# Patient Record
Sex: Female | Born: 1985 | Hispanic: Yes | Marital: Married | Smoking: Never smoker
Health system: Southern US, Community
[De-identification: ages and names within clinical notes are randomized; demographics above are authoritative.]

## PROBLEM LIST (undated history)

## (undated) DIAGNOSIS — O021 Missed abortion: Secondary | ICD-10-CM

## (undated) DIAGNOSIS — N83209 Unspecified ovarian cyst, unspecified side: Secondary | ICD-10-CM

## (undated) DIAGNOSIS — O3440 Maternal care for other abnormalities of cervix, unspecified trimester: Secondary | ICD-10-CM

## (undated) DIAGNOSIS — O219 Vomiting of pregnancy, unspecified: Secondary | ICD-10-CM

## (undated) DIAGNOSIS — R319 Hematuria, unspecified: Secondary | ICD-10-CM

## (undated) DIAGNOSIS — Z98891 History of uterine scar from previous surgery: Secondary | ICD-10-CM

## (undated) DIAGNOSIS — N979 Female infertility, unspecified: Secondary | ICD-10-CM

## (undated) DIAGNOSIS — N879 Dysplasia of cervix uteri, unspecified: Secondary | ICD-10-CM

## (undated) DIAGNOSIS — O4692 Antepartum hemorrhage, unspecified, second trimester: Secondary | ICD-10-CM

## (undated) DIAGNOSIS — Z9889 Other specified postprocedural states: Secondary | ICD-10-CM

## (undated) HISTORY — DX: Female infertility, unspecified: N97.9

## (undated) HISTORY — DX: Dysplasia of cervix uteri, unspecified: N87.9

## (undated) HISTORY — DX: Maternal care for other abnormalities of cervix, unspecified trimester: O34.40

## (undated) HISTORY — DX: Unspecified ovarian cyst, unspecified side: N83.209

## (undated) HISTORY — DX: Hematuria, unspecified: R31.9

## (undated) HISTORY — DX: Missed abortion: O02.1

## (undated) HISTORY — DX: Maternal care for other abnormalities of cervix, unspecified trimester: Z98.890

## (undated) HISTORY — DX: Vomiting of pregnancy, unspecified: O21.9

## (undated) HISTORY — DX: Antepartum hemorrhage, unspecified, second trimester: O46.92

---

## 2004-07-03 ENCOUNTER — Emergency Department: Payer: Self-pay | Admitting: General Practice

## 2005-05-19 ENCOUNTER — Emergency Department: Payer: Self-pay | Admitting: Emergency Medicine

## 2005-05-20 ENCOUNTER — Ambulatory Visit: Payer: Self-pay | Admitting: Emergency Medicine

## 2005-06-08 ENCOUNTER — Emergency Department: Payer: Self-pay | Admitting: General Practice

## 2006-01-26 ENCOUNTER — Emergency Department: Payer: Self-pay | Admitting: Emergency Medicine

## 2006-01-27 ENCOUNTER — Ambulatory Visit: Payer: Self-pay | Admitting: Emergency Medicine

## 2006-06-10 ENCOUNTER — Observation Stay: Payer: Self-pay

## 2006-06-12 ENCOUNTER — Observation Stay: Payer: Self-pay | Admitting: Certified Nurse Midwife

## 2006-06-16 ENCOUNTER — Observation Stay: Payer: Self-pay

## 2006-06-25 ENCOUNTER — Observation Stay: Payer: Self-pay

## 2006-06-28 ENCOUNTER — Observation Stay: Payer: Self-pay | Admitting: Obstetrics and Gynecology

## 2006-07-01 ENCOUNTER — Observation Stay: Payer: Self-pay | Admitting: Obstetrics and Gynecology

## 2006-07-04 ENCOUNTER — Observation Stay: Payer: Self-pay

## 2006-07-07 ENCOUNTER — Observation Stay: Payer: Self-pay | Admitting: Obstetrics and Gynecology

## 2006-07-11 ENCOUNTER — Observation Stay: Payer: Self-pay | Admitting: Obstetrics and Gynecology

## 2006-07-14 ENCOUNTER — Observation Stay: Payer: Self-pay | Admitting: Certified Nurse Midwife

## 2006-07-17 ENCOUNTER — Observation Stay: Payer: Self-pay | Admitting: Obstetrics and Gynecology

## 2006-07-20 ENCOUNTER — Inpatient Hospital Stay: Payer: Self-pay | Admitting: Obstetrics and Gynecology

## 2007-09-11 ENCOUNTER — Emergency Department: Payer: Self-pay | Admitting: Unknown Physician Specialty

## 2010-02-10 ENCOUNTER — Emergency Department: Payer: Self-pay | Admitting: Emergency Medicine

## 2010-06-30 HISTORY — PX: LEEP: SHX91

## 2014-08-22 LAB — OB RESULTS CONSOLE HEPATITIS B SURFACE ANTIGEN: Hepatitis B Surface Ag: NEGATIVE

## 2014-08-22 LAB — OB RESULTS CONSOLE GC/CHLAMYDIA
CHLAMYDIA, DNA PROBE: NEGATIVE
Gonorrhea: NEGATIVE

## 2014-08-22 LAB — OB RESULTS CONSOLE PLATELET COUNT: Platelets: 249 10*3/uL

## 2014-08-22 LAB — OB RESULTS CONSOLE HGB/HCT, BLOOD
HCT: 40 %
Hemoglobin: 13.6 g/dL

## 2014-08-22 LAB — OB RESULTS CONSOLE RUBELLA ANTIBODY, IGM: Rubella: IMMUNE

## 2014-08-22 LAB — OB RESULTS CONSOLE ABO/RH: RH Type: POSITIVE

## 2014-08-22 LAB — OB RESULTS CONSOLE HIV ANTIBODY (ROUTINE TESTING): HIV: NONREACTIVE

## 2014-08-22 LAB — OB RESULTS CONSOLE VARICELLA ZOSTER ANTIBODY, IGG: VARICELLA IGG: IMMUNE

## 2014-08-22 LAB — OB RESULTS CONSOLE RPR: RPR: NONREACTIVE

## 2014-08-22 LAB — OB RESULTS CONSOLE ANTIBODY SCREEN: Antibody Screen: NEGATIVE

## 2014-09-12 LAB — HM PAP SMEAR: HM PAP: NEGATIVE

## 2014-10-28 ENCOUNTER — Emergency Department
Admit: 2014-10-28 | Disposition: A | Discharge status: Home / Self Care | Payer: Self-pay | Admitting: Emergency Medicine

## 2014-10-28 LAB — URINALYSIS, COMPLETE
Bilirubin,UR: NEGATIVE
GLUCOSE, UR: NEGATIVE mg/dL (ref 0–75)
Ketone: NEGATIVE
NITRITE: NEGATIVE
PH: 8 (ref 4.5–8.0)
Protein: 30
SPECIFIC GRAVITY: 1.012 (ref 1.003–1.030)

## 2014-10-28 LAB — COMPREHENSIVE METABOLIC PANEL
ALBUMIN: 3.3 g/dL — AB
ALK PHOS: 76 U/L
ALT: 58 U/L — AB
Anion Gap: 8 (ref 7–16)
BILIRUBIN TOTAL: 0.4 mg/dL
BUN: 6 mg/dL
CREATININE: 0.44 mg/dL
Calcium, Total: 9.2 mg/dL
Chloride: 107 mmol/L
Co2: 23 mmol/L
EGFR (African American): >60
EGFR (Non-African Amer.): >60
Glucose: 95 mg/dL
POTASSIUM: 3.8 mmol/L
SGOT(AST): 40 U/L
SODIUM: 138 mmol/L
TOTAL PROTEIN: 6.1 g/dL — AB

## 2014-10-28 LAB — CBC
HCT: 39.5 % (ref 35.0–47.0)
HGB: 13.3 g/dL (ref 12.0–16.0)
MCH: 34 pg (ref 26.0–34.0)
MCHC: 33.6 g/dL (ref 32.0–36.0)
MCV: 101 fL — ABNORMAL HIGH (ref 80–100)
PLATELETS: 222 10*3/uL (ref 150–440)
RBC: 3.9 10*6/uL (ref 3.80–5.20)
RDW: 12.9 % (ref 11.5–14.5)
WBC: 8.3 10*3/uL (ref 3.6–11.0)

## 2014-10-28 LAB — HCG, QUANTITATIVE, PREGNANCY: Beta Hcg, Quant.: 37831 m[IU]/mL — ABNORMAL HIGH

## 2014-11-24 LAB — US OB FOLLOW UP

## 2014-12-04 ENCOUNTER — Telehealth: Payer: Self-pay | Admitting: Obstetrics and Gynecology

## 2014-12-04 DIAGNOSIS — K831 Obstruction of bile duct: Secondary | ICD-10-CM

## 2014-12-04 NOTE — Telephone Encounter (Signed)
Pt. States the itching all over which started yesterday. No rash. No difficulty breathing. No jaundice. She states she is going out of town this weekend also and hopes to get something for this. Pt. Informed that she could take Benadryl 25 mg. Po as directed, Aveeno bath. I will let Dr. Algis Downs. Know and see what else we need to do.  She had Cholestasis her last 2 pregnancies. Last pregnancy it started around this time.

## 2014-12-05 ENCOUNTER — Other Ambulatory Visit: Payer: No Typology Code available for payment source

## 2014-12-05 DIAGNOSIS — K831 Obstruction of bile duct: Secondary | ICD-10-CM

## 2014-12-05 NOTE — Telephone Encounter (Signed)
Pt. Coming in this pm for labs: hepatic panel and bile salts.

## 2014-12-06 NOTE — Addendum Note (Signed)
Addended by: Richardson ChiquitoRAVIS, Kamen Hanken M on: 12/06/2014 08:49 AM   Modules accepted: Orders

## 2014-12-07 LAB — HEPATIC FUNCTION PANEL
ALBUMIN: 3.8 g/dL (ref 3.5–5.5)
ALK PHOS: 109 IU/L (ref 39–117)
ALT: 50 IU/L — ABNORMAL HIGH (ref 0–32)
AST: 25 IU/L (ref 0–40)
Bilirubin, Direct: 0.08 mg/dL (ref 0.00–0.40)
Total Protein: 6.1 g/dL (ref 6.0–8.5)

## 2014-12-07 LAB — BILE ACIDS, TOTAL: Bile Acids Total: 11.6 umol/L (ref 4.7–24.5)

## 2014-12-08 ENCOUNTER — Telehealth: Payer: Self-pay

## 2014-12-08 NOTE — Telephone Encounter (Signed)
-----   Message from Martin A Defrancesco, MD sent at 12/08/2014  4:15 PM EDT ----- Please notify - Abnormal Labs SGOT elevated; all other Hepatic enzymes normal. Repeat Hepatic profile ion 3 months Bile acids are normal.  Recommend skin moisturizers and OTC hydrocortisone cream prn 

## 2014-12-11 NOTE — Telephone Encounter (Signed)
Pt aware.

## 2014-12-11 NOTE — Telephone Encounter (Signed)
-----   Message from Herold Harms, MD sent at 12/08/2014  4:15 PM EDT ----- Please notify - Abnormal Labs SGOT elevated; all other Hepatic enzymes normal. Repeat Hepatic profile ion 3 months Bile acids are normal.  Recommend skin moisturizers and OTC hydrocortisone cream prn

## 2014-12-14 ENCOUNTER — Encounter: Payer: Self-pay | Admitting: Obstetrics and Gynecology

## 2014-12-14 ENCOUNTER — Ambulatory Visit (INDEPENDENT_AMBULATORY_CARE_PROVIDER_SITE_OTHER): Payer: No Typology Code available for payment source | Admitting: Obstetrics and Gynecology

## 2014-12-14 VITALS — BP 109/71 | HR 109 | Wt 184.7 lb

## 2014-12-14 DIAGNOSIS — Z3492 Encounter for supervision of normal pregnancy, unspecified, second trimester: Secondary | ICD-10-CM

## 2014-12-14 DIAGNOSIS — O219 Vomiting of pregnancy, unspecified: Secondary | ICD-10-CM

## 2014-12-14 DIAGNOSIS — Z9889 Other specified postprocedural states: Secondary | ICD-10-CM

## 2014-12-14 DIAGNOSIS — N8320 Unspecified ovarian cysts: Secondary | ICD-10-CM

## 2014-12-14 DIAGNOSIS — N879 Dysplasia of cervix uteri, unspecified: Secondary | ICD-10-CM

## 2014-12-14 DIAGNOSIS — Z3482 Encounter for supervision of other normal pregnancy, second trimester: Secondary | ICD-10-CM

## 2014-12-14 DIAGNOSIS — Z87448 Personal history of other diseases of urinary system: Secondary | ICD-10-CM

## 2014-12-14 DIAGNOSIS — O3481 Maternal care for other abnormalities of pelvic organs, first trimester: Secondary | ICD-10-CM

## 2014-12-14 DIAGNOSIS — N83209 Unspecified ovarian cyst, unspecified side: Secondary | ICD-10-CM

## 2014-12-14 DIAGNOSIS — O0992 Supervision of high risk pregnancy, unspecified, second trimester: Secondary | ICD-10-CM

## 2014-12-14 LAB — POCT URINALYSIS DIPSTICK
Bilirubin, UA: NEGATIVE
Glucose, UA: NEGATIVE
Ketones, UA: NEGATIVE
LEUKOCYTES UA: NEGATIVE
Nitrite, UA: NEGATIVE
PROTEIN UA: NEGATIVE
Spec Grav, UA: 1.02
UROBILINOGEN UA: 0.2
pH, UA: 6.5

## 2014-12-14 NOTE — Progress Notes (Signed)
NO COMPLAINTS 

## 2014-12-15 DIAGNOSIS — O219 Vomiting of pregnancy, unspecified: Secondary | ICD-10-CM | POA: Insufficient documentation

## 2014-12-15 DIAGNOSIS — Z87448 Personal history of other diseases of urinary system: Secondary | ICD-10-CM | POA: Insufficient documentation

## 2014-12-15 DIAGNOSIS — N879 Dysplasia of cervix uteri, unspecified: Secondary | ICD-10-CM | POA: Insufficient documentation

## 2014-12-15 DIAGNOSIS — Z9889 Other specified postprocedural states: Secondary | ICD-10-CM | POA: Insufficient documentation

## 2014-12-15 DIAGNOSIS — N83209 Unspecified ovarian cyst, unspecified side: Secondary | ICD-10-CM | POA: Insufficient documentation

## 2014-12-15 DIAGNOSIS — O3481 Maternal care for other abnormalities of pelvic organs, first trimester: Secondary | ICD-10-CM

## 2014-12-15 DIAGNOSIS — O4692 Antepartum hemorrhage, unspecified, second trimester: Secondary | ICD-10-CM | POA: Insufficient documentation

## 2014-12-15 NOTE — Progress Notes (Signed)
Anatomy scan normal; placenta anterior, and remote from cervix;Cervix long and closed without funneling (history of LEEP procedure)

## 2014-12-21 ENCOUNTER — Telehealth: Payer: Self-pay | Admitting: Obstetrics and Gynecology

## 2014-12-21 NOTE — Telephone Encounter (Signed)
She has a rash under lip wants to know what's best to put on it.

## 2014-12-22 NOTE — Telephone Encounter (Signed)
Pt states she has a rash on her lip. Burns, red, dry and itchy. Using anti itch cream which helps. Rash will come and go. Advised to use A &d ointment or Aquaphor. If no better in a week she will need to be seen.

## 2014-12-30 ENCOUNTER — Emergency Department
Admission: EM | Admit: 2014-12-30 | Discharge: 2014-12-30 | Disposition: A | Discharge status: Other Acute Inpatient Hospital | Payer: Commercial Managed Care - PPO | Attending: Emergency Medicine | Admitting: Emergency Medicine

## 2014-12-30 ENCOUNTER — Inpatient Hospital Stay (HOSPITAL_COMMUNITY)
Admission: EM | Admit: 2014-12-30 | Discharge: 2015-01-10 | DRG: 765 | Disposition: A | Discharge status: Home / Self Care | Payer: PRIVATE HEALTH INSURANCE | Attending: Obstetrics & Gynecology | Admitting: Obstetrics & Gynecology

## 2014-12-30 ENCOUNTER — Encounter: Payer: Self-pay | Admitting: *Deleted

## 2014-12-30 DIAGNOSIS — O209 Hemorrhage in early pregnancy, unspecified: Secondary | ICD-10-CM | POA: Insufficient documentation

## 2014-12-30 DIAGNOSIS — S3762XA Contusion of uterus, initial encounter: Secondary | ICD-10-CM | POA: Diagnosis present

## 2014-12-30 DIAGNOSIS — O9A22 Injury, poisoning and certain other consequences of external causes complicating childbirth: Secondary | ICD-10-CM | POA: Insufficient documentation

## 2014-12-30 DIAGNOSIS — O42912 Preterm premature rupture of membranes, unspecified as to length of time between rupture and onset of labor, second trimester: Secondary | ICD-10-CM | POA: Diagnosis not present

## 2014-12-30 DIAGNOSIS — R000 Tachycardia, unspecified: Secondary | ICD-10-CM

## 2014-12-30 DIAGNOSIS — Y92410 Unspecified street and highway as the place of occurrence of the external cause: Secondary | ICD-10-CM | POA: Diagnosis not present

## 2014-12-30 DIAGNOSIS — Z3A26 26 weeks gestation of pregnancy: Secondary | ICD-10-CM | POA: Diagnosis not present

## 2014-12-30 DIAGNOSIS — S40029A Contusion of unspecified upper arm, initial encounter: Secondary | ICD-10-CM | POA: Diagnosis present

## 2014-12-30 DIAGNOSIS — Y998 Other external cause status: Secondary | ICD-10-CM | POA: Diagnosis not present

## 2014-12-30 DIAGNOSIS — S299XXA Unspecified injury of thorax, initial encounter: Secondary | ICD-10-CM | POA: Insufficient documentation

## 2014-12-30 DIAGNOSIS — S3991XA Unspecified injury of abdomen, initial encounter: Secondary | ICD-10-CM

## 2014-12-30 DIAGNOSIS — O42919 Preterm premature rupture of membranes, unspecified as to length of time between rupture and onset of labor, unspecified trimester: Secondary | ICD-10-CM | POA: Diagnosis present

## 2014-12-30 DIAGNOSIS — O4690 Antepartum hemorrhage, unspecified, unspecified trimester: Secondary | ICD-10-CM

## 2014-12-30 DIAGNOSIS — S301XXA Contusion of abdominal wall, initial encounter: Secondary | ICD-10-CM | POA: Diagnosis not present

## 2014-12-30 DIAGNOSIS — Z833 Family history of diabetes mellitus: Secondary | ICD-10-CM

## 2014-12-30 DIAGNOSIS — O4692 Antepartum hemorrhage, unspecified, second trimester: Secondary | ICD-10-CM | POA: Diagnosis present

## 2014-12-30 DIAGNOSIS — Z87410 Personal history of cervical dysplasia: Secondary | ICD-10-CM

## 2014-12-30 DIAGNOSIS — Z98891 History of uterine scar from previous surgery: Secondary | ICD-10-CM

## 2014-12-30 DIAGNOSIS — Y9389 Activity, other specified: Secondary | ICD-10-CM | POA: Insufficient documentation

## 2014-12-30 DIAGNOSIS — T1490XA Injury, unspecified, initial encounter: Secondary | ICD-10-CM

## 2014-12-30 DIAGNOSIS — S8010XA Contusion of unspecified lower leg, initial encounter: Secondary | ICD-10-CM | POA: Diagnosis present

## 2014-12-30 DIAGNOSIS — O4693 Antepartum hemorrhage, unspecified, third trimester: Secondary | ICD-10-CM

## 2014-12-30 DIAGNOSIS — O322XX0 Maternal care for transverse and oblique lie, not applicable or unspecified: Secondary | ICD-10-CM | POA: Diagnosis not present

## 2014-12-30 DIAGNOSIS — O9989 Other specified diseases and conditions complicating pregnancy, childbirth and the puerperium: Secondary | ICD-10-CM | POA: Diagnosis not present

## 2014-12-30 HISTORY — DX: History of uterine scar from previous surgery: Z98.891

## 2014-12-30 LAB — CBC WITH DIFFERENTIAL/PLATELET
Basophils Absolute: 0 10*3/uL (ref 0–0.1)
Basophils Relative: 0 %
EOS PCT: 2 %
Eosinophils Absolute: 0.2 10*3/uL (ref 0–0.7)
HEMATOCRIT: 38.1 % (ref 35.0–47.0)
HEMOGLOBIN: 12.8 g/dL (ref 12.0–16.0)
LYMPHS ABS: 1 10*3/uL (ref 1.0–3.6)
Lymphocytes Relative: 7 %
MCH: 34.6 pg — ABNORMAL HIGH (ref 26.0–34.0)
MCHC: 33.6 g/dL (ref 32.0–36.0)
MCV: 103.1 fL — ABNORMAL HIGH (ref 80.0–100.0)
Monocytes Absolute: 0.8 10*3/uL (ref 0.2–0.9)
Monocytes Relative: 5 %
NEUTROS PCT: 86 %
Neutro Abs: 12.6 10*3/uL — ABNORMAL HIGH (ref 1.4–6.5)
Platelets: 242 10*3/uL (ref 150–440)
RBC: 3.69 MIL/uL — AB (ref 3.80–5.20)
RDW: 13.3 % (ref 11.5–14.5)
WBC: 14.5 10*3/uL — ABNORMAL HIGH (ref 3.6–11.0)

## 2014-12-30 LAB — BASIC METABOLIC PANEL
ANION GAP: 11 (ref 5–15)
BUN: 10 mg/dL (ref 6–20)
CO2: 25 mmol/L (ref 22–32)
CREATININE: 0.53 mg/dL (ref 0.44–1.00)
Calcium: 9.3 mg/dL (ref 8.9–10.3)
Chloride: 105 mmol/L (ref 101–111)
GFR calc Af Amer: >60 mL/min (ref >60)
GFR calc non Af Amer: >60 mL/min (ref >60)
Glucose, Bld: 127 mg/dL — ABNORMAL HIGH (ref 65–99)
Potassium: 3.5 mmol/L (ref 3.5–5.1)
Sodium: 141 mmol/L (ref 135–145)

## 2014-12-30 LAB — ABO/RH: ABO/RH(D): O POS

## 2014-12-30 MED ORDER — SODIUM CHLORIDE 0.9 % IV BOLUS (SEPSIS)
1000.0000 mL | Freq: Once | INTRAVENOUS | Status: AC
  Administered 2014-12-30: 1000 mL via INTRAVENOUS

## 2014-12-30 MED ORDER — ACETAMINOPHEN 325 MG PO TABS
650.0000 mg | ORAL_TABLET | Freq: Once | ORAL | Status: AC
  Administered 2014-12-31: 650 mg via ORAL
  Filled 2014-12-30: qty 2
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
## 2014-12-30 NOTE — ED Notes (Signed)
This writer was called into patient's room because patient was c/o vaginal bleeding. MD aware.

## 2014-12-30 NOTE — ED Notes (Signed)
Fetal heart tones 148 obtained by doppler by Dr. Shaune PollackLord.

## 2014-12-30 NOTE — ED Notes (Signed)
Report called to Nat Mathavid RN charge RN at [facility]. Patient left at approximately 2250 via [facility]
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
## 2014-12-31 ENCOUNTER — Encounter (HOSPITAL_COMMUNITY): Payer: Self-pay | Admitting: Anesthesiology

## 2014-12-31 ENCOUNTER — Emergency Department (HOSPITAL_COMMUNITY): Payer: PRIVATE HEALTH INSURANCE

## 2014-12-31 ENCOUNTER — Encounter (HOSPITAL_COMMUNITY): Payer: Self-pay | Admitting: Radiology

## 2014-12-31 ENCOUNTER — Inpatient Hospital Stay (HOSPITAL_COMMUNITY): Payer: PRIVATE HEALTH INSURANCE

## 2014-12-31 DIAGNOSIS — S8010XA Contusion of unspecified lower leg, initial encounter: Secondary | ICD-10-CM | POA: Diagnosis present

## 2014-12-31 DIAGNOSIS — S3991XA Unspecified injury of abdomen, initial encounter: Secondary | ICD-10-CM | POA: Diagnosis not present

## 2014-12-31 DIAGNOSIS — O322XX0 Maternal care for transverse and oblique lie, not applicable or unspecified: Secondary | ICD-10-CM | POA: Diagnosis not present

## 2014-12-31 DIAGNOSIS — O4692 Antepartum hemorrhage, unspecified, second trimester: Secondary | ICD-10-CM | POA: Diagnosis not present

## 2014-12-31 DIAGNOSIS — S40029A Contusion of unspecified upper arm, initial encounter: Secondary | ICD-10-CM | POA: Diagnosis present

## 2014-12-31 DIAGNOSIS — O9A212 Injury, poisoning and certain other consequences of external causes complicating pregnancy, second trimester: Secondary | ICD-10-CM | POA: Diagnosis not present

## 2014-12-31 DIAGNOSIS — Z3A26 26 weeks gestation of pregnancy: Secondary | ICD-10-CM | POA: Diagnosis present

## 2014-12-31 DIAGNOSIS — Z87410 Personal history of cervical dysplasia: Secondary | ICD-10-CM | POA: Diagnosis not present

## 2014-12-31 DIAGNOSIS — O26892 Other specified pregnancy related conditions, second trimester: Secondary | ICD-10-CM | POA: Diagnosis not present

## 2014-12-31 DIAGNOSIS — Z3A25 25 weeks gestation of pregnancy: Secondary | ICD-10-CM

## 2014-12-31 DIAGNOSIS — O9989 Other specified diseases and conditions complicating pregnancy, childbirth and the puerperium: Secondary | ICD-10-CM | POA: Diagnosis present

## 2014-12-31 DIAGNOSIS — Z833 Family history of diabetes mellitus: Secondary | ICD-10-CM | POA: Diagnosis not present

## 2014-12-31 DIAGNOSIS — O468X2 Other antepartum hemorrhage, second trimester: Secondary | ICD-10-CM | POA: Diagnosis not present

## 2014-12-31 DIAGNOSIS — O42912 Preterm premature rupture of membranes, unspecified as to length of time between rupture and onset of labor, second trimester: Secondary | ICD-10-CM | POA: Diagnosis not present

## 2014-12-31 DIAGNOSIS — S3762XA Contusion of uterus, initial encounter: Secondary | ICD-10-CM | POA: Diagnosis not present

## 2014-12-31 LAB — BASIC METABOLIC PANEL
ANION GAP: 8 (ref 5–15)
BUN: 6 mg/dL (ref 6–20)
CO2: 24 mmol/L (ref 22–32)
Calcium: 9 mg/dL (ref 8.9–10.3)
Chloride: 108 mmol/L (ref 101–111)
Creatinine, Ser: 0.53 mg/dL (ref 0.44–1.00)
GFR calc Af Amer: >60 mL/min (ref >60)
Glucose, Bld: 127 mg/dL — ABNORMAL HIGH (ref 65–99)
Potassium: 3.4 mmol/L — ABNORMAL LOW (ref 3.5–5.1)
SODIUM: 140 mmol/L (ref 135–145)

## 2014-12-31 LAB — CBC WITH DIFFERENTIAL/PLATELET
Basophils Absolute: 0 10*3/uL (ref 0.0–0.1)
Basophils Relative: 0 % (ref 0–1)
EOS ABS: 0.1 10*3/uL (ref 0.0–0.7)
EOS PCT: 1 % (ref 0–5)
HEMATOCRIT: 35.7 % — AB (ref 36.0–46.0)
HEMOGLOBIN: 12.2 g/dL (ref 12.0–15.0)
Lymphocytes Relative: 6 % — ABNORMAL LOW (ref 12–46)
Lymphs Abs: 0.9 10*3/uL (ref 0.7–4.0)
MCH: 34.2 pg — ABNORMAL HIGH (ref 26.0–34.0)
MCHC: 34.2 g/dL (ref 30.0–36.0)
MCV: 100 fL (ref 78.0–100.0)
MONO ABS: 0.8 10*3/uL (ref 0.1–1.0)
Monocytes Relative: 6 % (ref 3–12)
NEUTROS ABS: 11.7 10*3/uL — AB (ref 1.7–7.7)
Neutrophils Relative %: 87 % — ABNORMAL HIGH (ref 43–77)
PLATELETS: 236 10*3/uL (ref 150–400)
RBC: 3.57 MIL/uL — AB (ref 3.87–5.11)
RDW: 13.4 % (ref 11.5–15.5)
WBC: 13.5 10*3/uL — AB (ref 4.0–10.5)

## 2014-12-31 LAB — CBC
HEMATOCRIT: 33 % — AB (ref 36.0–46.0)
Hemoglobin: 11.3 g/dL — ABNORMAL LOW (ref 12.0–15.0)
MCH: 34.6 pg — ABNORMAL HIGH (ref 26.0–34.0)
MCHC: 34.2 g/dL (ref 30.0–36.0)
MCV: 100.9 fL — AB (ref 78.0–100.0)
Platelets: 207 10*3/uL (ref 150–400)
RBC: 3.27 MIL/uL — ABNORMAL LOW (ref 3.87–5.11)
RDW: 13.5 % (ref 11.5–15.5)
WBC: 10 10*3/uL (ref 4.0–10.5)

## 2014-12-31 LAB — URINALYSIS, ROUTINE W REFLEX MICROSCOPIC
Glucose, UA: 100 mg/dL — AB
Ketones, ur: 40 mg/dL — AB
Nitrite: POSITIVE — AB
Protein, ur: 100 mg/dL — AB
Specific Gravity, Urine: >1.046 — ABNORMAL HIGH (ref 1.005–1.030)
UROBILINOGEN UA: 1 mg/dL (ref 0.0–1.0)
pH: 5.5 (ref 5.0–8.0)

## 2014-12-31 LAB — TYPE AND SCREEN
ABO/RH(D): O POS
Antibody Screen: NEGATIVE

## 2014-12-31 LAB — URINE MICROSCOPIC-ADD ON

## 2014-12-31 LAB — I-STAT BETA HCG BLOOD, ED (MC, WL, AP ONLY)

## 2014-12-31 LAB — ABO/RH
ABO/RH(D): O POS
ABO/RH(D): O POS

## 2014-12-31 MED ORDER — CALCIUM CARBONATE ANTACID 500 MG PO CHEW: 2.0000 | CHEWABLE_TABLET | ORAL | Status: DC | PRN

## 2014-12-31 MED ORDER — OXYCODONE-ACETAMINOPHEN 5-325 MG PO TABS
2.0000 | ORAL_TABLET | ORAL | Status: DC | PRN
  Administered 2015-01-01 – 2015-01-07 (×13): 2 via ORAL
  Filled 2014-12-31 (×13): qty 2

## 2014-12-31 MED ORDER — PRENATAL MULTIVITAMIN CH
1.0000 | ORAL_TABLET | Freq: Every day | ORAL | Status: DC
  Administered 2014-12-31 – 2015-01-08 (×9): 1 via ORAL
  Filled 2014-12-31 (×9): qty 1

## 2014-12-31 MED ORDER — BETAMETHASONE SOD PHOS & ACET 6 (3-3) MG/ML IJ SUSP
12.0000 mg | Freq: Once | INTRAMUSCULAR | Status: AC
  Administered 2014-12-31: 12 mg via INTRAMUSCULAR
  Filled 2014-12-31: qty 2

## 2014-12-31 MED ORDER — MAGNESIUM SULFATE BOLUS VIA INFUSION
4.0000 g | Freq: Once | INTRAVENOUS | Status: AC
  Administered 2014-12-31: 4 g via INTRAVENOUS
  Filled 2014-12-31: qty 500

## 2014-12-31 MED ORDER — LORATADINE 10 MG PO TABS
10.0000 mg | ORAL_TABLET | Freq: Every day | ORAL | Status: DC
  Administered 2014-12-31 – 2015-01-08 (×9): 10 mg via ORAL
  Filled 2014-12-31 (×11): qty 1

## 2014-12-31 MED ORDER — ACETAMINOPHEN 325 MG PO TABS
650.0000 mg | ORAL_TABLET | ORAL | Status: DC | PRN
  Administered 2014-12-31 – 2015-01-07 (×11): 650 mg via ORAL
  Filled 2014-12-31 (×12): qty 2

## 2014-12-31 MED ORDER — AQUAPHOR EX OINT
TOPICAL_OINTMENT | CUTANEOUS | Status: DC | PRN
  Administered 2014-12-31: 1 via TOPICAL
  Administered 2015-01-06: 09:00:00 via TOPICAL
  Filled 2014-12-31: qty 50

## 2014-12-31 MED ORDER — ZOLPIDEM TARTRATE 5 MG PO TABS
5.0000 mg | ORAL_TABLET | Freq: Every evening | ORAL | Status: DC | PRN
  Administered 2015-01-06: 5 mg via ORAL
  Filled 2014-12-31: qty 1

## 2014-12-31 MED ORDER — BETAMETHASONE SOD PHOS & ACET 6 (3-3) MG/ML IJ SUSP
12.0000 mg | Freq: Once | INTRAMUSCULAR | Status: AC
  Administered 2015-01-01: 12 mg via INTRAMUSCULAR
  Filled 2014-12-31: qty 2

## 2014-12-31 MED ORDER — LACTATED RINGERS IV SOLN
INTRAVENOUS | Status: DC
  Administered 2014-12-31 – 2015-01-01 (×5): via INTRAVENOUS

## 2014-12-31 MED ORDER — MAGNESIUM SULFATE 50 % IJ SOLN
2.0000 g/h | INTRAMUSCULAR | Status: AC
  Filled 2014-12-31: qty 80

## 2014-12-31 MED ORDER — DOCUSATE SODIUM 100 MG PO CAPS
100.0000 mg | ORAL_CAPSULE | Freq: Every day | ORAL | Status: DC
  Administered 2014-12-31 – 2015-01-08 (×9): 100 mg via ORAL
  Filled 2014-12-31 (×9): qty 1

## 2014-12-31 MED ORDER — IOHEXOL 300 MG/ML  SOLN
80.0000 mL | Freq: Once | INTRAMUSCULAR | Status: AC | PRN
  Administered 2014-12-31: 80 mL via INTRAVENOUS
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
## 2014-12-31 NOTE — Progress Notes (Signed)
Hr via doppler remains 140-150 during transport via [facility]. No audible decels, pt does not report any feeling of leaking of fluid or bleeding
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
## 2014-12-31 NOTE — Progress Notes (Signed)
Monitors off, continuously monitoring HR via doppler. Vaginal bleeding remains consistent. Pt reports trickle q 10-15 minutes. In 1 hour (1-2) plum sized spot on pad. Findings reported to Dr Penne LashLeggett prior [facility]

## 2014-12-31 NOTE — ED Notes (Signed)
[facility] en[facility]
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
## 2014-12-31 NOTE — ED Notes (Signed)
Dr. Tsuei at bedside.  

## 2014-12-31 NOTE — ED Notes (Signed)
OB Rapid Response nurse remains at the bedside
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
## 2014-12-31 NOTE — ED Notes (Signed)
[facility] contacted to transport [facility] 
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
## 2014-12-31 NOTE — ED Notes (Signed)
Discussion with Dr Penne LashLeggett and Dr Corliss Skainssuei about location of bleed seen on CT scan. Dr Corliss Skainssuei confirmed that bleed is not abdominal, just above cervix and pt is medically cleared [facility]

## 2015-01-01 DIAGNOSIS — O4692 Antepartum hemorrhage, unspecified, second trimester: Secondary | ICD-10-CM

## 2015-01-01 LAB — URINE CULTURE
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
## 2015-01-02 ENCOUNTER — Inpatient Hospital Stay (HOSPITAL_COMMUNITY): Payer: PRIVATE HEALTH INSURANCE

## 2015-01-02 ENCOUNTER — Encounter (HOSPITAL_COMMUNITY): Payer: Self-pay | Admitting: *Deleted

## 2015-01-02 DIAGNOSIS — O26892 Other specified pregnancy related conditions, second trimester: Secondary | ICD-10-CM

## 2015-01-02 DIAGNOSIS — O468X2 Other antepartum hemorrhage, second trimester: Secondary | ICD-10-CM

## 2015-01-02 MED ORDER — BUTALBITAL-APAP-CAFFEINE 50-325-40 MG PO TABS
1.0000 | ORAL_TABLET | ORAL | Status: DC | PRN
  Administered 2015-01-02 – 2015-01-03 (×2): 1 via ORAL
  Filled 2015-01-02 (×2): qty 1

## 2015-01-02 NOTE — Progress Notes (Signed)
Pt c/o right knee being swollen, unable to bear full weight on rt leg
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
## 2015-01-02 NOTE — Progress Notes (Signed)
Ice pack applied to right knee for edema.

## 2015-01-02 NOTE — Progress Notes (Signed)
[facility] ortho tech, ext [phone], notified of new order for crutches - [facility] [place] [id number]
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
## 2015-01-04 ENCOUNTER — Encounter (HOSPITAL_COMMUNITY): Payer: Self-pay | Admitting: Family Medicine

## 2015-01-04 LAB — TYPE AND SCREEN
ABO/RH(D): O POS
Antibody Screen: NEGATIVE
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
## 2015-01-04 NOTE — Progress Notes (Signed)
Pt transferred to room 155 and report given to Wynonia Soursebbie Brown, [facility]

## 2015-01-05 ENCOUNTER — Encounter (HOSPITAL_COMMUNITY): Payer: Self-pay | Admitting: Obstetrics & Gynecology

## 2015-01-05 NOTE — Plan of Care (Signed)
Problem: Phase I Progression Outcomes Goal: Maintains reassuring Fetal Heart Rate Outcome: Progressing Monitoring done for 30 minutes QS.
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
## 2015-01-06 MED ORDER — LACTATED RINGERS IV BOLUS (SEPSIS)
500.0000 mL | Freq: Once | INTRAVENOUS | Status: AC
  Administered 2015-01-06: 500 mL via INTRAVENOUS

## 2015-01-06 MED ORDER — LACTATED RINGERS IV SOLN
INTRAVENOUS | Status: DC
  Administered 2015-01-06 – 2015-01-08 (×5): via INTRAVENOUS

## 2015-01-06 MED ORDER — LACTATED RINGERS IV BOLUS (SEPSIS)
1000.0000 mL | Freq: Once | INTRAVENOUS | Status: AC
  Administered 2015-01-06: 1000 mL via INTRAVENOUS

## 2015-01-06 MED ORDER — BUTORPHANOL TARTRATE 1 MG/ML IJ SOLN
2.0000 mg | Freq: Once | INTRAMUSCULAR | Status: AC
  Administered 2015-01-06: 2 mg via INTRAVENOUS
  Filled 2015-01-06: qty 2

## 2015-01-06 NOTE — Progress Notes (Signed)
Pt called out reporting an increased frequency in contractions. RN in room to evaluate. Pt stated, " i feel them about every ten minutes, but had three back to back." Pt rates pain as a 9.

## 2015-01-06 NOTE — Progress Notes (Signed)
Constant, MD, notified of patient reporting increased frequency in contractions. Notified of UC pattern, bleeding (none noted on pads - only pink when pt up to bathroom and wipes), abd relaxed on palpation and tachycardia. Provider reviewed strip. Orders received for continuous IV fluids.
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
## 2015-01-07 LAB — TYPE AND SCREEN
ABO/RH(D): O POS
Antibody Screen: NEGATIVE

## 2015-01-07 MED ORDER — SODIUM CHLORIDE 0.9 % IV SOLN
2.0000 g | Freq: Four times a day (QID) | INTRAVENOUS | Status: DC
  Administered 2015-01-07 – 2015-01-08 (×3): 2 g via INTRAVENOUS
  Filled 2015-01-07 (×6): qty 2000

## 2015-01-07 MED ORDER — SODIUM CHLORIDE 0.9 % IV SOLN
500.0000 mg | Freq: Three times a day (TID) | INTRAVENOUS | Status: DC
  Administered 2015-01-07 – 2015-01-08 (×3): 500 mg via INTRAVENOUS
  Filled 2015-01-07 (×4): qty 10
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
## 2015-01-08 ENCOUNTER — Inpatient Hospital Stay (HOSPITAL_COMMUNITY): Payer: PRIVATE HEALTH INSURANCE

## 2015-01-08 ENCOUNTER — Encounter (HOSPITAL_COMMUNITY): Payer: Self-pay | Admitting: *Deleted

## 2015-01-08 ENCOUNTER — Inpatient Hospital Stay (HOSPITAL_COMMUNITY): Payer: PRIVATE HEALTH INSURANCE | Admitting: Anesthesiology

## 2015-01-08 ENCOUNTER — Encounter (HOSPITAL_COMMUNITY)
Admission: EM | Disposition: A | Discharge status: Home / Self Care | Payer: Commercial Managed Care - PPO | Source: Home / Self Care | Attending: Obstetrics & Gynecology

## 2015-01-08 DIAGNOSIS — O42912 Preterm premature rupture of membranes, unspecified as to length of time between rupture and onset of labor, second trimester: Secondary | ICD-10-CM

## 2015-01-08 DIAGNOSIS — S3762XA Contusion of uterus, initial encounter: Secondary | ICD-10-CM

## 2015-01-08 DIAGNOSIS — Z98891 History of uterine scar from previous surgery: Secondary | ICD-10-CM

## 2015-01-08 DIAGNOSIS — O322XX0 Maternal care for transverse and oblique lie, not applicable or unspecified: Secondary | ICD-10-CM | POA: Diagnosis not present

## 2015-01-08 DIAGNOSIS — O42919 Preterm premature rupture of membranes, unspecified as to length of time between rupture and onset of labor, unspecified trimester: Secondary | ICD-10-CM | POA: Diagnosis present

## 2015-01-08 DIAGNOSIS — Z87410 Personal history of cervical dysplasia: Secondary | ICD-10-CM

## 2015-01-08 DIAGNOSIS — Z3A26 26 weeks gestation of pregnancy: Secondary | ICD-10-CM

## 2015-01-08 HISTORY — DX: History of uterine scar from previous surgery: Z98.891

## 2015-01-08 LAB — TROPONIN I
TROPONIN I: 0.09 ng/mL — AB (ref <0.031)
TROPONIN I: 0.03 ng/mL (ref <0.031)

## 2015-01-08 LAB — CBC
HCT: 31.1 % — ABNORMAL LOW (ref 36.0–46.0)
Hemoglobin: 10.5 g/dL — ABNORMAL LOW (ref 12.0–15.0)
MCH: 35 pg — AB (ref 26.0–34.0)
MCHC: 33.8 g/dL (ref 30.0–36.0)
MCV: 103.7 fL — AB (ref 78.0–100.0)
Platelets: 237 10*3/uL (ref 150–400)
RBC: 3 MIL/uL — AB (ref 3.87–5.11)
RDW: 14.4 % (ref 11.5–15.5)
WBC: 15.3 10*3/uL — AB (ref 4.0–10.5)

## 2015-01-08 LAB — COMPREHENSIVE METABOLIC PANEL
ALBUMIN: 2.7 g/dL — AB (ref 3.5–5.0)
ALK PHOS: 189 U/L — AB (ref 38–126)
ALT: 68 U/L — ABNORMAL HIGH (ref 14–54)
AST: 39 U/L (ref 15–41)
Anion gap: 8 (ref 5–15)
BUN: 8 mg/dL (ref 6–20)
CHLORIDE: 105 mmol/L (ref 101–111)
CO2: 22 mmol/L (ref 22–32)
Calcium: 8.7 mg/dL — ABNORMAL LOW (ref 8.9–10.3)
Creatinine, Ser: 0.48 mg/dL (ref 0.44–1.00)
GFR calc Af Amer: >60 mL/min (ref >60)
Glucose, Bld: 189 mg/dL — ABNORMAL HIGH (ref 65–99)
Potassium: 3.6 mmol/L (ref 3.5–5.1)
Sodium: 135 mmol/L (ref 135–145)
Total Bilirubin: 1.1 mg/dL (ref 0.3–1.2)
Total Protein: 5.9 g/dL — ABNORMAL LOW (ref 6.5–8.1)

## 2015-01-08 LAB — BRAIN NATRIURETIC PEPTIDE: B Natriuretic Peptide: 45.3 pg/mL (ref 0.0–100.0)

## 2015-01-08 LAB — TSH: TSH: 1.943 u[IU]/mL (ref 0.350–4.500)

## 2015-01-08 SURGERY — Surgical Case
Anesthesia: Spinal | Site: Abdomen

## 2015-01-08 MED ORDER — DIPHENHYDRAMINE HCL 25 MG PO CAPS: 25.0000 mg | ORAL_CAPSULE | ORAL | Status: DC | PRN

## 2015-01-08 MED ORDER — NALBUPHINE HCL 10 MG/ML IJ SOLN: 5.0000 mg | INTRAMUSCULAR | Status: DC | PRN

## 2015-01-08 MED ORDER — OXYCODONE-ACETAMINOPHEN 5-325 MG PO TABS: 2.0000 | ORAL_TABLET | ORAL | Status: DC | PRN

## 2015-01-08 MED ORDER — SCOPOLAMINE 1 MG/3DAYS TD PT72
MEDICATED_PATCH | TRANSDERMAL | Status: DC | PRN
  Administered 2015-01-08: 1 via TRANSDERMAL

## 2015-01-08 MED ORDER — MORPHINE SULFATE 0.5 MG/ML IJ SOLN
INTRAMUSCULAR | Status: AC
Start: 2015-01-08 — End: 2015-01-08
  Filled 2015-01-08: qty 10

## 2015-01-08 MED ORDER — MEPERIDINE HCL 25 MG/ML IJ SOLN: 6.2500 mg | INTRAMUSCULAR | Status: DC | PRN

## 2015-01-08 MED ORDER — NALOXONE HCL 1 MG/ML IJ SOLN
1.0000 ug/kg/h | INTRAVENOUS | Status: DC | PRN
  Filled 2015-01-08: qty 2

## 2015-01-08 MED ORDER — KETOROLAC TROMETHAMINE 30 MG/ML IJ SOLN
30.0000 mg | Freq: Four times a day (QID) | INTRAMUSCULAR | Status: AC | PRN
  Administered 2015-01-08: 30 mg via INTRAMUSCULAR

## 2015-01-08 MED ORDER — DIPHENHYDRAMINE HCL 25 MG PO CAPS: 25.0000 mg | ORAL_CAPSULE | Freq: Four times a day (QID) | ORAL | Status: DC | PRN

## 2015-01-08 MED ORDER — ZOLPIDEM TARTRATE 5 MG PO TABS: 5.0000 mg | ORAL_TABLET | Freq: Every evening | ORAL | Status: DC | PRN

## 2015-01-08 MED ORDER — SODIUM CHLORIDE 0.9 % IJ SOLN: 3.0000 mL | INTRAMUSCULAR | Status: DC | PRN

## 2015-01-08 MED ORDER — ONDANSETRON HCL 4 MG/2ML IJ SOLN: 4.0000 mg | Freq: Once | INTRAMUSCULAR | Status: DC | PRN

## 2015-01-08 MED ORDER — MENTHOL 3 MG MT LOZG: 1.0000 | LOZENGE | OROMUCOSAL | Status: DC | PRN

## 2015-01-08 MED ORDER — NALBUPHINE HCL 10 MG/ML IJ SOLN: 5.0000 mg | Freq: Once | INTRAMUSCULAR | Status: AC | PRN

## 2015-01-08 MED ORDER — PHENYLEPHRINE 8 MG IN D5W 100 ML (0.08MG/ML) PREMIX OPTIME
INJECTION | INTRAVENOUS | Status: AC
  Filled 2015-01-08: qty 100

## 2015-01-08 MED ORDER — BUPIVACAINE HCL (PF) 0.5 % IJ SOLN
INTRAMUSCULAR | Status: DC | PRN
  Administered 2015-01-08: 30 mL

## 2015-01-08 MED ORDER — SENNOSIDES-DOCUSATE SODIUM 8.6-50 MG PO TABS
2.0000 | ORAL_TABLET | ORAL | Status: DC
  Administered 2015-01-08 – 2015-01-09 (×2): 2 via ORAL
  Filled 2015-01-08 (×2): qty 2

## 2015-01-08 MED ORDER — PRENATAL MULTIVITAMIN CH
1.0000 | ORAL_TABLET | Freq: Every day | ORAL | Status: DC
  Administered 2015-01-09 – 2015-01-10 (×2): 1 via ORAL
  Filled 2015-01-08 (×2): qty 1

## 2015-01-08 MED ORDER — SIMETHICONE 80 MG PO CHEW
80.0000 mg | CHEWABLE_TABLET | ORAL | Status: DC
  Administered 2015-01-08 – 2015-01-09 (×2): 80 mg via ORAL
  Filled 2015-01-08 (×2): qty 1

## 2015-01-08 MED ORDER — IBUPROFEN 600 MG PO TABS
600.0000 mg | ORAL_TABLET | Freq: Four times a day (QID) | ORAL | Status: DC
  Administered 2015-01-08 – 2015-01-10 (×7): 600 mg via ORAL
  Filled 2015-01-08 (×7): qty 1

## 2015-01-08 MED ORDER — MAGNESIUM HYDROXIDE 400 MG/5ML PO SUSP: 30.0000 mL | ORAL | Status: DC | PRN

## 2015-01-08 MED ORDER — SCOPOLAMINE 1 MG/3DAYS TD PT72
MEDICATED_PATCH | TRANSDERMAL | Status: AC
  Filled 2015-01-08: qty 1

## 2015-01-08 MED ORDER — SIMETHICONE 80 MG PO CHEW
80.0000 mg | CHEWABLE_TABLET | ORAL | Status: DC | PRN
  Administered 2015-01-09: 80 mg via ORAL

## 2015-01-08 MED ORDER — KETOROLAC TROMETHAMINE 30 MG/ML IJ SOLN: 30.0000 mg | Freq: Four times a day (QID) | INTRAMUSCULAR | Status: AC | PRN

## 2015-01-08 MED ORDER — MORPHINE SULFATE (PF) 0.5 MG/ML IJ SOLN
INTRAMUSCULAR | Status: DC | PRN
  Administered 2015-01-08: .2 mg via EPIDURAL

## 2015-01-08 MED ORDER — ONDANSETRON HCL 4 MG/2ML IJ SOLN
INTRAMUSCULAR | Status: AC
  Filled 2015-01-08: qty 2

## 2015-01-08 MED ORDER — LACTATED RINGERS IV SOLN
INTRAVENOUS | Status: DC | PRN
  Administered 2015-01-08: 15:00:00 via INTRAVENOUS

## 2015-01-08 MED ORDER — OXYCODONE-ACETAMINOPHEN 5-325 MG PO TABS
1.0000 | ORAL_TABLET | ORAL | Status: DC | PRN
  Administered 2015-01-09 – 2015-01-10 (×3): 1 via ORAL
  Filled 2015-01-08 (×3): qty 1

## 2015-01-08 MED ORDER — SCOPOLAMINE 1 MG/3DAYS TD PT72
1.0000 | MEDICATED_PATCH | Freq: Once | TRANSDERMAL | Status: DC
  Filled 2015-01-08: qty 1

## 2015-01-08 MED ORDER — CITRIC ACID-SODIUM CITRATE 334-500 MG/5ML PO SOLN
ORAL | Status: AC
  Administered 2015-01-08: 30 mL
  Filled 2015-01-08: qty 15

## 2015-01-08 MED ORDER — OXYTOCIN 40 UNITS IN LACTATED RINGERS INFUSION - SIMPLE MED: 62.5000 mL/h | INTRAVENOUS | Status: AC

## 2015-01-08 MED ORDER — CITRIC ACID-SODIUM CITRATE 334-500 MG/5ML PO SOLN: 30.0000 mL | Freq: Once | ORAL | Status: DC

## 2015-01-08 MED ORDER — DIPHENHYDRAMINE HCL 25 MG PO CAPS
25.0000 mg | ORAL_CAPSULE | Freq: Once | ORAL | Status: AC
  Administered 2015-01-08: 25 mg via ORAL
  Filled 2015-01-08: qty 1

## 2015-01-08 MED ORDER — ONDANSETRON HCL 4 MG/2ML IJ SOLN
INTRAMUSCULAR | Status: DC | PRN
  Administered 2015-01-08: 4 mg via INTRAVENOUS

## 2015-01-08 MED ORDER — OXYTOCIN 10 UNIT/ML IJ SOLN
40.0000 [IU] | INTRAVENOUS | Status: DC | PRN
  Administered 2015-01-08: 40 [IU] via INTRAVENOUS

## 2015-01-08 MED ORDER — IOHEXOL 350 MG/ML SOLN
100.0000 mL | Freq: Once | INTRAVENOUS | Status: AC | PRN
  Administered 2015-01-08: 100 mL via INTRAVENOUS

## 2015-01-08 MED ORDER — FENTANYL CITRATE (PF) 100 MCG/2ML IJ SOLN
INTRAMUSCULAR | Status: AC
Start: 2015-01-08 — End: 2015-01-08
  Administered 2015-01-08: 25 ug via INTRAVENOUS
  Filled 2015-01-08: qty 2

## 2015-01-08 MED ORDER — PHENYLEPHRINE 8 MG IN D5W 100 ML (0.08MG/ML) PREMIX OPTIME
INJECTION | INTRAVENOUS | Status: DC | PRN
  Administered 2015-01-08: 60 ug/min via INTRAVENOUS

## 2015-01-08 MED ORDER — TETANUS-DIPHTH-ACELL PERTUSSIS 5-2.5-18.5 LF-MCG/0.5 IM SUSP: 0.5000 mL | Freq: Once | INTRAMUSCULAR | Status: DC

## 2015-01-08 MED ORDER — BUPIVACAINE HCL (PF) 0.5 % IJ SOLN
INTRAMUSCULAR | Status: AC
  Filled 2015-01-08: qty 30

## 2015-01-08 MED ORDER — DIPHENHYDRAMINE HCL 50 MG/ML IJ SOLN: 12.5000 mg | INTRAMUSCULAR | Status: DC | PRN

## 2015-01-08 MED ORDER — WITCH HAZEL-GLYCERIN EX PADS: 1.0000 "application " | MEDICATED_PAD | CUTANEOUS | Status: DC | PRN

## 2015-01-08 MED ORDER — CEFAZOLIN SODIUM-DEXTROSE 2-3 GM-% IV SOLR
INTRAVENOUS | Status: AC
  Filled 2015-01-08: qty 50

## 2015-01-08 MED ORDER — FENTANYL CITRATE (PF) 100 MCG/2ML IJ SOLN
INTRAMUSCULAR | Status: AC
  Filled 2015-01-08: qty 2

## 2015-01-08 MED ORDER — MEASLES, MUMPS & RUBELLA VAC ~~LOC~~ INJ
0.5000 mL | INJECTION | Freq: Once | SUBCUTANEOUS | Status: DC
  Filled 2015-01-08: qty 0.5

## 2015-01-08 MED ORDER — FENTANYL CITRATE (PF) 100 MCG/2ML IJ SOLN
INTRAMUSCULAR | Status: DC | PRN
  Administered 2015-01-08: 10 ug via INTRAVENOUS

## 2015-01-08 MED ORDER — DIBUCAINE 1 % RE OINT: 1.0000 "application " | TOPICAL_OINTMENT | RECTAL | Status: DC | PRN

## 2015-01-08 MED ORDER — NALOXONE HCL 0.4 MG/ML IJ SOLN: 0.4000 mg | INTRAMUSCULAR | Status: DC | PRN

## 2015-01-08 MED ORDER — OXYTOCIN 10 UNIT/ML IJ SOLN
INTRAMUSCULAR | Status: AC
  Filled 2015-01-08: qty 4

## 2015-01-08 MED ORDER — ONDANSETRON HCL 4 MG/2ML IJ SOLN: 4.0000 mg | Freq: Three times a day (TID) | INTRAMUSCULAR | Status: DC | PRN

## 2015-01-08 MED ORDER — LANOLIN HYDROUS EX OINT: 1.0000 "application " | TOPICAL_OINTMENT | CUTANEOUS | Status: DC | PRN

## 2015-01-08 MED ORDER — KETOROLAC TROMETHAMINE 30 MG/ML IJ SOLN
INTRAMUSCULAR | Status: AC
  Administered 2015-01-08: 30 mg via INTRAMUSCULAR
  Filled 2015-01-08: qty 1

## 2015-01-08 MED ORDER — ACETAMINOPHEN 325 MG PO TABS: 650.0000 mg | ORAL_TABLET | ORAL | Status: DC | PRN

## 2015-01-08 MED ORDER — BUPIVACAINE IN DEXTROSE 0.75-8.25 % IT SOLN
INTRATHECAL | Status: DC | PRN
  Administered 2015-01-08: 1.6 mL via INTRATHECAL

## 2015-01-08 MED ORDER — FENTANYL CITRATE (PF) 100 MCG/2ML IJ SOLN
25.0000 ug | INTRAMUSCULAR | Status: DC | PRN
  Administered 2015-01-08: 25 ug via INTRAVENOUS
  Administered 2015-01-08: 50 ug via INTRAVENOUS
  Administered 2015-01-08: 25 ug via INTRAVENOUS

## 2015-01-08 MED ORDER — CEFAZOLIN SODIUM-DEXTROSE 2-3 GM-% IV SOLR
INTRAVENOUS | Status: DC | PRN
Start: 2015-01-08 — End: 2015-01-08
  Administered 2015-01-08: 2 g via INTRAVENOUS

## 2015-01-08 MED ORDER — LACTATED RINGERS IV SOLN
INTRAVENOUS | Status: DC
  Administered 2015-01-08: 23:00:00 via INTRAVENOUS

## 2015-01-08 SURGICAL SUPPLY — 33 items
CLAMP CORD UMBIL (MISCELLANEOUS) IMPLANT
CLOTH BEACON ORANGE TIMEOUT ST (SAFETY) ×2 IMPLANT
DRAPE SHEET LG 3/4 BI-LAMINATE (DRAPES) IMPLANT
DRSG OPSITE POSTOP 4X10 (GAUZE/BANDAGES/DRESSINGS) ×2 IMPLANT
DURAPREP 26ML APPLICATOR (WOUND CARE) ×2 IMPLANT
ELECT REM PT RETURN 9FT ADLT (ELECTROSURGICAL) ×2
ELECTRODE REM PT RTRN 9FT ADLT (ELECTROSURGICAL) ×1 IMPLANT
EXTRACTOR VACUUM M CUP 4 TUBE (SUCTIONS) IMPLANT
GLOVE BIOGEL PI IND STRL 7.0 (GLOVE) ×2 IMPLANT
GLOVE BIOGEL PI INDICATOR 7.0 (GLOVE) ×2
GLOVE ECLIPSE 7.0 STRL STRAW (GLOVE) ×2 IMPLANT
GOWN STRL REUS W/TWL LRG LVL3 (GOWN DISPOSABLE) ×4 IMPLANT
HEMOSTAT SURGICEL 2X14 (HEMOSTASIS) ×2 IMPLANT
KIT ABG SYR 3ML LUER SLIP (SYRINGE) IMPLANT
NEEDLE HYPO 22GX1.5 SAFETY (NEEDLE) ×2 IMPLANT
NEEDLE HYPO 25X5/8 SAFETYGLIDE (NEEDLE) ×2 IMPLANT
NS IRRIG 1000ML POUR BTL (IV SOLUTION) ×2 IMPLANT
PACK C SECTION WH (CUSTOM PROCEDURE TRAY) ×2 IMPLANT
PAD ABD 7.5X8 STRL (GAUZE/BANDAGES/DRESSINGS) ×2 IMPLANT
PAD OB MATERNITY 4.3X12.25 (PERSONAL CARE ITEMS) ×2 IMPLANT
RTRCTR C-SECT PINK 25CM LRG (MISCELLANEOUS) ×2 IMPLANT
SUT PDS AB 0 CT1 27 (SUTURE) IMPLANT
SUT PDS AB 0 CTX 36 PDP370T (SUTURE) IMPLANT
SUT PLAIN 2 0 XLH (SUTURE) IMPLANT
SUT VIC AB 0 CT1 36 (SUTURE) ×4 IMPLANT
SUT VIC AB 0 CTX 36 (SUTURE) ×5
SUT VIC AB 0 CTX36XBRD ANBCTRL (SUTURE) ×5 IMPLANT
SUT VIC AB 3-0 SH 27 (SUTURE) ×1
SUT VIC AB 3-0 SH 27X BRD (SUTURE) ×1 IMPLANT
SUT VIC AB 4-0 KS 27 (SUTURE) ×2 IMPLANT
SYR CONTROL 10ML LL (SYRINGE) ×2 IMPLANT
TOWEL OR 17X24 6PK STRL BLUE (TOWEL DISPOSABLE) ×2 IMPLANT
TRAY FOLEY CATH SILVER 14FR (SET/KITS/TRAYS/PACK) ×2 IMPLANT
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
## 2015-01-08 NOTE — Anesthesia Postprocedure Evaluation (Addendum)
  Anesthesia Post-op Note  Patient: Cathy EhlersJanet Branch  Procedure(s) Performed: Procedure(s) (LRB): CESAREAN SECTION (N/A)  Patient Location: PACU  Anesthesia Type: Spinal  Level of Consciousness: awake and alert   Airway and Oxygen Therapy: Patient Spontanous Breathing  Post-op Pain: mild  Post-op Assessment: Post-op Vital signs reviewed, Patient's Cardiovascular Status Stable, Respiratory Function Stable, Patent Airway and No signs of Nausea or vomiting  Post-op Vital Signs: stable, HR 99     Complications: No apparent anesthesia complications

## 2015-01-08 NOTE — Progress Notes (Signed)
Called by nurse for evaluation of ROM  Came and evaluated patient and found to be grossly ruptured large amount of clear fluid, had some of the old blood mixed in but clear fluid  Sonogram confirms as well as there is little fluid present now also fetus remains breech  Antibiotics ampicillin and erythromycin begun  Pt has already had steroids and CP prophylaxis magnesium sulfate  Will do continuous monitoring for now
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
## 2015-01-08 NOTE — Progress Notes (Signed)
Faculty Practice OB/GYN Attending Note  Received call from MFM informing me of presence of fetal arm and hand in the vagina as noted on ultrasound.  Patient was taken back to Antenatal room and examined, fetal hand and arm easily palpated in vagina.  Unable to palpate rest of fetus easily,cervix dilated to 1-2 cm.   FHR reassuring currently.  Patient also reports resolved SOB and blurry vision, but is still tachycardic to 117.  Given fetal malpresentation s/p recent PPROM, MFM  (Dr. Claudean SeveranceWhitecar) recommended urgent delivery and this was discussed with patient. She is s/p betamethasone and magnesium sulfate regimen last week.  Patient agreed with this recommendation.  The risks of cesarean section discussed with the patient included but were not limited to: bleeding which may require transfusion or reoperation; infection which may require antibiotics; injury to bowel, bladder, ureters or other surrounding organs; injury to the fetus; need for additional procedures including hysterectomy in the event of a life-threatening hemorrhage; placental abnormalities wth subsequent pregnancies, incisional problems, thromboembolic phenomenon and other postoperative/anesthesia complications.  Patient was told that this will likely involve a vertical uterine incision hence predisposing her to repeat cesarean deliveries around 36-37 weeks for subsequent pregnancies.  Also discussed in detail the risk of injury to fetus given his outstretched arm, risk of dislocation and fracture were discussed. The patient concurred with the proposed plan, giving informed written consent for the procedure.   Patient has been NPO since 0830 she will remain NPO for procedure. Anesthesia, Neonatology and OR aware of need for urgent cesarean delivery. Preoperative prophylactic antibiotics and SCDs ordered on call to the OR.  To OR when ready.    Jaynie CollinsUGONNA  Staysha Truby, MD, FACOG Attending Obstetrician & Gynecologist Faculty Practice, [facility]  [place]
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
## 2015-01-08 NOTE — Addendum Note (Signed)
Addendum  created 01/08/15 1625 by Karie SchwalbeMary Mamoudou Mulvehill, MD   Modules edited: Notes Section, Orders, PRL Based Order Sets   Notes Section:  File: [id number]

## 2015-01-08 NOTE — Transfer of Care (Signed)
Immediate Anesthesia Transfer of Care Note  Patient: Marigene EhlersJanet Cromie  Procedure(s) Performed: Procedure(s) with comments: CESAREAN SECTION (N/A) - classical on uterus   Patient Location: PACU  Anesthesia Type:Spinal  Level of Consciousness: awake, alert  and oriented  Airway & Oxygen Therapy: Patient Spontanous Breathing  Post-op Assessment: Report given to RN and Post -op Vital signs reviewed and stable  Post vital signs: Reviewed and stable  Last Vitals:  Filed Vitals:   01/08/15 1430  BP:   Pulse: 119  Temp:   Resp:     Complications: No apparent anesthesia complications

## 2015-01-08 NOTE — Progress Notes (Signed)
Approximately 2225, patient states gush of fluid, worried about bleeding. Pad saturated with thin clear fluid. notified Dr. Despina HiddenEure. MD at bedside to perform spec exam. Per MD spec exam not necessary, due to amount of fluid. MD was comfortable stating SROM. Antibiotics ordered as well as continuous monitoring for the present time.

## 2015-01-08 NOTE — Anesthesia Procedure Notes (Signed)
Spinal Patient location during procedure: OR Staffing Anesthesiologist: Kaytlynne Neace Performed by: anesthesiologist  Preanesthetic Checklist Completed: patient identified, site marked, surgical consent, pre-op evaluation, timeout performed, IV checked, risks and benefits discussed and monitors and equipment checked Spinal Block Patient position: sitting Prep: ChloraPrep Patient monitoring: continuous pulse ox, blood pressure and heart rate Approach: midline Location: L3-4 Injection technique: single-shot Needle Needle type: Sprotte  Needle gauge: 24 G Needle length: 9 cm Additional Notes Functioning IV was confirmed and monitors were applied. Sterile prep and drape, including hand hygiene, mask and sterile gloves were used. The patient was positioned and the spine was prepped. The skin was anesthetized with lidocaine.  Free flow of clear CSF was obtained prior to injecting local anesthetic into the CSF.  The spinal needle aspirated freely following injection.  The needle was carefully withdrawn.  The patient tolerated the procedure well. Consent was obtained prior to procedure with all questions answered and concerns addressed. Risks including but not limited to bleeding, infection, nerve damage, paralysis, failed block, inadequate analgesia, allergic reaction, high spinal, itching and headache were discussed and the patient wished to proceed.   Cathy Cullens, MD     

## 2015-01-08 NOTE — Op Note (Signed)
Cathy Bray PROCEDURE DATE: 01/08/2015  PREOPERATIVE DIAGNOSES: Intrauterine pregnancy at 466w2d weeks gestation; PPROM at 365w1d; follow up ultrasound and exam today revealed fetal arm in vagina  POSTOPERATIVE DIAGNOSES: The same; fetal malpresentation with fetus in transverse back down presentation  PROCEDURE: Urgent Primary Classical Cesarean Section  SURGEON:  Dr. Jaynie CollinsUgonna Nile Dorning  ANESTHESIOLOGIST: Dr. Karie SchwalbeMary Judd  INDICATIONS: Cathy EhlersJanet Bray is a 29 y.o. [phone] at 3566w2d here for urgent cesarean section secondary to the indications listed under preoperative diagnoses; please see preoperative note for further details.  The risks of cesarean section were discussed with the patient including but were not limited to: bleeding which may require transfusion or reoperation; infection which may require antibiotics; injury to bowel, bladder, ureters or other surrounding organs; injury to the fetus; need for additional procedures including hysterectomy in the event of a life-threatening hemorrhage; placental abnormalities wth subsequent pregnancies, incisional problems, thromboembolic phenomenon and other postoperative/anesthesia complications. Patient was told that this will likely involve a vertical uterine incision hence predisposing her to repeat cesarean deliveries around 36-37 weeks for subsequent pregnancies. Also discussed in detail the risk of injury to fetus given his outstretched arm, risk of dislocation and fracture were discussed.   The patient concurred with the proposed plan, giving informed written consent for the procedure.    FINDINGS:  Viable female infant in transverse back down, with acromion left anterior presentation with outstretched arm into vagina.  Apgars 4/7/8, weight 2 lb 6.8 oz. Scant bloody amniotic fluid. Anterior irregular appearing iIntact placenta, three vessel cord.  Normal uterus, fallopian tubes and ovaries bilaterally.  ANESTHESIA: Spinal INTRAVENOUS FLUIDS: 1000  ml ESTIMATED BLOOD LOSS: 600 ml URINE OUTPUT:  300 ml SPECIMENS: Placenta sent to pathology COMPLICATIONS: None immediate  PROCEDURE IN DETAIL:  The patient preoperatively received intravenous antibiotics and had sequential compression devices applied to her lower extremities.  She was then taken to the operating room where spinal anesthesia was administered and was found to be adequate. She was then placed in a dorsal supine position with a leftward tilt, and prepped and draped in a sterile manner.  A foley catheter was placed into her bladder and attached to constant gravity.  After an adequate timeout was performed, a Pfannenstiel skin incision was made with scalpel and carried through to the underlying layer of fascia. The fascia was incised in the midline, and this incision was extended bilaterally using the Mayo scissors.  Kocher clamps were applied to the superior aspect of the fascial incision and the underlying rectus muscles were dissected off bluntly. A similar process was carried out on the inferior aspect of the fascial incision. The rectus muscles were separated in the midline bluntly and the peritoneum was entered bluntly. Attention was turned to the lower uterine segment which was not noted to be developed and, and a vertical hysterotomy was made with a scalpel and extended sharply.  The infant was successfully delivered with some difficulty given the outstretched arm in the vagina, the transverse back down presentation and anterior placenta.  The cord was clamped and cut and the infant was handed over to awaiting neonatology team.  The placenta was subsequently delivered  intact with a three-vessel cord. The uterus was then cleared of clot and debris.  The hysterotomy was closed with 0 Vicryl in a running locked fashion, and an imbricating layer was also placed with 0 Vicryl.  Figure-of-eight serosal stitches were placed to help with hemostasis.  The pelvis was cleared of all clot and debris.  Hemostasis  was confirmed on all surfaces.  The peritoneum and the muscles were reapproximated using 0 Vicryl interrupted stitches. The fascia was then closed using 0 PDS in a running fashion.  The subcutaneous layer was irrigated, then reapproximated with 2-0 plain gut interrupted stitches, and 30 ml of 0.5% Marcaine was injected subcutaneously around the incision.  The skin was closed with a 4-0 Vicryl subcuticular stitch. The patient tolerated the procedure well. Sponge, lap, instrument and needle counts were correct x 3.  She was taken to the recovery room in stable condition.   Given that a classical cesarean section was performed, patient was informed that she will need repeat cesarean sections around 36-[redacted] weeks gestation for subsequent pregnancies.   Jaynie Collins, MD, FACOG Attending Obstetrician & Gynecologist Faculty Practice, [facility]
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
## 2015-01-09 ENCOUNTER — Encounter (HOSPITAL_COMMUNITY): Payer: Self-pay | Admitting: Obstetrics & Gynecology

## 2015-01-09 LAB — CBC
HEMATOCRIT: 27.1 % — AB (ref 36.0–46.0)
Hemoglobin: 8.9 g/dL — ABNORMAL LOW (ref 12.0–15.0)
MCH: 33.8 pg (ref 26.0–34.0)
MCHC: 32.8 g/dL (ref 30.0–36.0)
MCV: 103 fL — ABNORMAL HIGH (ref 78.0–100.0)
Platelets: 219 10*3/uL (ref 150–400)
RBC: 2.63 MIL/uL — AB (ref 3.87–5.11)
RDW: 14.4 % (ref 11.5–15.5)
WBC: 12.2 10*3/uL — ABNORMAL HIGH (ref 4.0–10.5)

## 2015-01-09 LAB — RPR: RPR Ser Ql: NONREACTIVE

## 2015-01-09 NOTE — Progress Notes (Signed)
Post Partum Day 1 Subjective:  Cathy Bray is a 29 y.o. [phone] 2258w2d s/p primary Classical CS.  No acute events overnight.  Pt denies problems with ambulating, voiding or po intake.  She denies nausea or vomiting.  Pain is well controlled.  She has had flatus. She has not had bowel movement.  Lochia Minimal.  Plan for birth control is Unsure at this time.  Method of Feeding: Breast- currently using donor milk  Objective: Blood pressure 117/71, pulse 111, temperature 97.5 F (36.4 C), temperature source Oral, resp. rate 17, height 5\' 3"  (1.6 m), weight 181 lb (82.101 kg), last menstrual period 07/01/2014, SpO2 97 %, unknown if currently breastfeeding.  Physical Exam:  General: alert, cooperative and no distress Lochia:normal flow Chest: CTAB Heart: RRR no m/r/g Abdomen: +BS, soft, nontender,  Uterine Fundus: firm DVT Evaluation: No evidence of DVT seen on physical exam. Extremities: no edema   Recent Labs  01/08/15 1027 01/09/15 0510  HGB 10.5* 8.9*  HCT 31.1* 27.1*    Assessment/Plan:  ASSESSMENT: Cathy Bray is a 29 y.o. [phone] 6858w2d s/p primary classical CS  Plan for discharge tomorrow, Breastfeeding and Contraception needs to be discussed further- currently unsure   LOS: 9 days   Federico FlakeKimberly Niles Jacquel Mccamish 01/09/2015, 9:51 PM

## 2015-01-09 NOTE — Addendum Note (Signed)
Addendum  created 01/09/15 0753 by Algis GreenhouseLinda A Bridgitt Raggio, CRNA   Modules edited: Notes Section   Notes Section:  File: [id number]

## 2015-01-09 NOTE — Lactation Note (Signed)
This note was copied from the chart of Cathy Bray. Lactation Consultation Note  Patient Name: Cathy Bray ZOXWR'UToday's Date: 01/09/2015 Reason for consult: Initial assessment;NICU baby NICU baby 324 hours old. Mom states that she has been pumping routinely and has seen a small amount of colostrum. Discussed normal progression of milk coming in and enc mom to continue pumping every 2-3 hours/8 times a day and sleep for a 5 hour stretch at night. Enc mom to hand express after pumping. Mom given NICU booklet with review, and is aware of pumping rooms in NICU. Enc mom to pump after visiting baby. [facility] given [facility], aware of OP/BFSG, community resources, and [facility] phone [facility] assistance after [facility]. [facility]  Maternal Data Has patient been taught Hand Expression?: Yes Does the patient have breastfeeding experience prior to this delivery?: Yes  Feeding    LATCH Score/Interventions                      Lactation Tools Discussed/Used Pump Review: Setup, frequency, and cleaning;Milk Storage Initiated by:: bedside RN. Date initiated:: 01/08/15   Consult Status Consult Status: Follow-up Date: 01/10/15 Follow-up type: In-patient    Geralynn OchsWILLIARD, Iness Pangilinan 01/09/2015, 5:07 PM
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
## 2015-01-09 NOTE — Anesthesia Postprocedure Evaluation (Signed)
Anesthesia Post Note  Patient: Cathy EhlersJanet Bray  Procedure(s) Performed: Procedure(s) (LRB): CESAREAN SECTION (N/A)  Anesthesia type: Spinal  Patient location: Mother/Baby  Post pain: Pain level controlled  Post assessment: Post-op Vital signs reviewed  Last Vitals:  Filed Vitals:   01/09/15 0605  BP: 109/59  Pulse: 82  Temp: 36.7 C  Resp: 14    Post vital signs: Reviewed  Level of consciousness: awake  Complications: No apparent anesthesia complications

## 2015-01-10 MED ORDER — TETANUS-DIPHTH-ACELL PERTUSSIS 5-2.5-18.5 LF-MCG/0.5 IM SUSP
0.5000 mL | Freq: Once | INTRAMUSCULAR | Status: AC
  Administered 2015-01-10: 0.5 mL via INTRAMUSCULAR
  Filled 2015-01-10: qty 0.5

## 2015-01-10 MED ORDER — OXYCODONE-ACETAMINOPHEN 5-325 MG PO TABS: 1.0000 | ORAL_TABLET | Freq: Four times a day (QID) | ORAL | Status: DC | PRN
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
## 2015-01-10 NOTE — Discharge Instructions (Signed)
Cesarean Delivery  Cesarean delivery is the birth of a baby through a cut (incision) in the abdomen and womb (uterus).  LET YOUR HEALTH CARE PROVIDER KNOW ABOUT:  All medicines you are taking, including vitamins, herbs, eye drops, creams, and over-the-counter medicines.  Previous problems you or members of your family have had with the use of anesthetics.  Any blood disorders you have.  Previous surgeries you have had.  Medical conditions you have.  Any allergies you have.  Complicationsinvolving the pregnancy. RISKS AND COMPLICATIONS  Generally, this is a safe procedure. However, as with any procedure, complications can occur. Possible complications include:  Bleeding.  Infection.  Blood clots.  Injury to surrounding organs.  Problems with anesthesia.  Injury to the baby. BEFORE THE PROCEDURE   You may be given an antacid medicine to drink. This will prevent acid contents in your stomach from going into your lungs if you vomit during the surgery.  You may be given an antibiotic medicine to prevent infection. PROCEDURE   Hair may be removed from your pubic area and your lower abdomen. This is to prevent infection in the incision site.  A tube (Foley catheter) will be placed in your bladder to drain your urine from your bladder into a bag. This keeps your bladder empty during surgery.  An IV tube will be placed in your vein.  You may be given medicine to numb the lower half of your body (regional anesthetic). If you were in labor, you may have already had an epidural in place which can be used in both labor and cesarean delivery. You may possibly be given medicine to make you sleep (general anesthetic) though this is not as common.  An incision will be made in your abdomen that extends to your uterus. There are 2 basic kinds of incisions:  The horizontal (transverse) incision. Horizontal incisions are from side to side and are used for most routine cesarean  deliveries.  The vertical incision. The vertical incision is from the top of the abdomen to the bottom and is less commonly used. It is often done for women who have a serious complication (extreme prematurity) or under emergency situations.  The horizontal and vertical incisions may both be used at the same time. However, this is very uncommon.  An incision is then made in your uterus to deliver the baby.  Your baby will then be delivered.  Both incisions are then closed with absorbable stitches. AFTER THE PROCEDURE   If you were awake during the surgery, you will see your baby right away. If you were asleep, you will see your baby as soon as you are awake.  You may breastfeed your baby after surgery.  You may be able to get up and walk the same day as the surgery. If you need to stay in bed for a period of time, you will receive help to turn, cough, and take deep breaths after surgery. This helps prevent lung problems such as pneumonia.  Do not get out of bed alone the first time after surgery. You will need help getting out of bed until you are able to do this by yourself.  You may be able to shower the day after your cesarean delivery. After the bandage (dressing) is taken off the incision site, a nurse will assist you to shower if you would like help.  You will have pneumatic compression hose placed on your lower legs. This is done to prevent blood clots. When you are up   and walking regularly, they will no longer be necessary.  Do not cross your legs when you sit.  Save any blood clots that you pass. If you pass a clot while on the toilet, do not flush it. Call for the nurse. Tell the nurse if you think you are bleeding too much or passing too many clots.  You will be given medicine as needed. Let your health care providers know if you are hurting. You may also be given an antibiotic to prevent an infection.  Your IV tube will be taken out when you are drinking a reasonable  amount of fluids. The Foley catheter is taken out when you are up and walking.  If your blood type is Rh negative and your baby's blood type is Rh positive, you will be given a shot of anti-D immune globulin. This shot prevents you from having Rh problems with a future pregnancy. You should get the shot even if you had your tubes tied (tubal ligation).  If you are allowed to take the baby for a walk, place the baby in the bassinet and push it. Do not carry your baby in your arms. Document Released: 06/16/2005 Document Revised: 04/06/2013 Document Reviewed: 01/05/2013 ExitCare Patient Information 2015 [employer]. This information is not intended to replace advice given to you by your health care provider. Make sure you discuss any questions you have with your health care provider.  

## 2015-01-10 NOTE — Lactation Note (Signed)
This note was copied from the chart of Cathy Marigene EhlersJanet Rogue. Lactation Consultation Note  Patient Name: Cathy Bray TDDUK'GToday's Date: 01/10/2015 Reason for consult: Follow-up assessment;NICU baby NICU baby 6847 hours old. Discussed engorgement prevention/treatment. Mom aware of pumping rooms in NICU. Enc mom to [facility] as needed in [facility]ICU or from home. Mom aware of OP/BFSG and LC phone line assistance after D/C. Called to see patient prior to D/C for a DEBP. [facility] but [facility]. While this LC downstairs getting a pump for patient, patient's RN called and stated that patient has worked out something else and does not need [facility].   Maternal Data    Feeding Feeding Type: Donor Breast Milk  LATCH Score/Interventions                      Lactation Tools Discussed/Used     Consult Status Consult Status: PRN    Geralynn OchsWILLIARD, Tiffiany Beadles 01/10/2015, 2:20 PM

## 2015-01-10 NOTE — Progress Notes (Signed)
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
## 2015-01-11 ENCOUNTER — Encounter: Payer: No Typology Code available for payment source | Admitting: Obstetrics and Gynecology

## 2015-01-11 ENCOUNTER — Telehealth: Payer: Self-pay | Admitting: Obstetrics and Gynecology

## 2015-01-11 NOTE — Telephone Encounter (Signed)
Questions about her c-section

## 2015-01-12 ENCOUNTER — Telehealth: Payer: Self-pay

## 2015-01-12 ENCOUNTER — Ambulatory Visit (INDEPENDENT_AMBULATORY_CARE_PROVIDER_SITE_OTHER): Payer: No Typology Code available for payment source | Admitting: Obstetrics and Gynecology

## 2015-01-12 ENCOUNTER — Encounter: Payer: Self-pay | Admitting: Obstetrics and Gynecology

## 2015-01-12 VITALS — BP 123/86 | HR 121 | Temp 99.3°F | Wt 187.1 lb

## 2015-01-12 DIAGNOSIS — IMO0001 Reserved for inherently not codable concepts without codable children: Secondary | ICD-10-CM

## 2015-01-12 DIAGNOSIS — T814XXA Infection following a procedure, initial encounter: Secondary | ICD-10-CM

## 2015-01-12 DIAGNOSIS — O9279 Other disorders of lactation: Secondary | ICD-10-CM

## 2015-01-12 MED ORDER — CEPHALEXIN 500 MG PO CAPS: 500.0000 mg | ORAL_CAPSULE | Freq: Three times a day (TID) | ORAL | Status: DC

## 2015-01-12 NOTE — Telephone Encounter (Signed)
Pt calls with chills x 1hr and temp of 100.6 (o).  Unsure if incision site red, draining but is painful. When she looked this am she did not see any s/s infection. No c/o pain or burning upon urination. C/O breast soreness but is also pumping. No reddness, warmth, streaks, or extreme tenderness in breast. Pt advised to come in to be seen. Pt will have to call for a way. Dr Valentino Saxonherry has C/S soon and if unable to come within 20-30min. It will be after 4pm before she can see pt due to the C/S providing she is not detained at the hospital longer. Pt will call back. Pt is on her way. Dr Valentino Saxonherry notified and they are ready for C/S, so I will let her know when pt arrives and she will come to office when she is finished at hospital.

## 2015-01-12 NOTE — Telephone Encounter (Signed)
[facility] C/o of an itchy type rash where the bandage was from her surgery. Not red or painful. Incision is not oozing or painful. No fevers. Advised hydrocortisone cream as needed. Call transferred to RF to make an appt for 1 week post. Pt advised to contact office if she develops fever, redness, or oozing of incision site.
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
## 2015-01-12 NOTE — Progress Notes (Signed)
Patient ID: Marigene EhlersJanet Trudo, female   DOB: 11/10/1985, 29 y.o.   MRN: [id number] Pt had called earlier with chills, fever, Temp 100.6, abdominal pain. Symptons had just started after lunch. Unsure if incision site s/s infection, looked at it this am but not this afternoon. Also c/o breast soreness, is pumping. No redness, streaks, or hot to touch.  No c/o burning, pain upon urination.

## 2015-01-16 ENCOUNTER — Encounter: Payer: Self-pay | Admitting: Obstetrics and Gynecology

## 2015-01-16 ENCOUNTER — Ambulatory Visit (INDEPENDENT_AMBULATORY_CARE_PROVIDER_SITE_OTHER): Payer: No Typology Code available for payment source | Admitting: Obstetrics and Gynecology

## 2015-01-16 VITALS — BP 104/70 | HR 97 | Temp 98.3°F | Ht 63.0 in | Wt 179.3 lb

## 2015-01-16 DIAGNOSIS — Z4889 Encounter for other specified surgical aftercare: Secondary | ICD-10-CM

## 2015-01-16 DIAGNOSIS — R5082 Postprocedural fever: Secondary | ICD-10-CM
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
## 2015-01-17 LAB — CBC WITH DIFFERENTIAL/PLATELET
BASOS: 0 %
Basophils Absolute: 0 10*3/uL (ref 0.0–0.2)
EOS (ABSOLUTE): 0.3 10*3/uL (ref 0.0–0.4)
Eos: 5 %
Hematocrit: 32.9 % — ABNORMAL LOW (ref 34.0–46.6)
Hemoglobin: 11.1 g/dL (ref 11.1–15.9)
IMMATURE GRANULOCYTES: 2 %
Immature Grans (Abs): 0.1 10*3/uL (ref 0.0–0.1)
LYMPHS ABS: 1 10*3/uL (ref 0.7–3.1)
LYMPHS: 17 %
MCH: 33.4 pg — ABNORMAL HIGH (ref 26.6–33.0)
MCHC: 33.7 g/dL (ref 31.5–35.7)
MCV: 99 fL — ABNORMAL HIGH (ref 79–97)
MONOCYTES: 7 %
Monocytes Absolute: 0.4 10*3/uL (ref 0.1–0.9)
NEUTROS PCT: 70 %
Neutrophils Absolute: 4.2 10*3/uL (ref 1.4–7.0)
PLATELETS: 389 10*3/uL — AB (ref 150–379)
RBC: 3.32 x10E6/uL — ABNORMAL LOW (ref 3.77–5.28)
RDW: 14.2 % (ref 12.3–15.4)
WBC: 6 10*3/uL (ref 3.4–10.8)

## 2015-01-19 ENCOUNTER — Encounter: Payer: Self-pay | Admitting: Obstetrics and Gynecology

## 2015-01-19 ENCOUNTER — Ambulatory Visit (INDEPENDENT_AMBULATORY_CARE_PROVIDER_SITE_OTHER): Payer: No Typology Code available for payment source | Admitting: Obstetrics and Gynecology

## 2015-01-19 VITALS — BP 111/65 | HR 137 | Temp 100.6°F | Ht 63.0 in | Wt 175.6 lb

## 2015-01-19 DIAGNOSIS — R319 Hematuria, unspecified: Secondary | ICD-10-CM

## 2015-01-19 DIAGNOSIS — R5082 Postprocedural fever: Secondary | ICD-10-CM

## 2015-01-19 DIAGNOSIS — R102 Pelvic and perineal pain unspecified side: Secondary | ICD-10-CM

## 2015-01-19 DIAGNOSIS — G8918 Other acute postprocedural pain: Secondary | ICD-10-CM | POA: Diagnosis not present

## 2015-01-19 DIAGNOSIS — R300 Dysuria: Secondary | ICD-10-CM | POA: Diagnosis not present

## 2015-01-19 LAB — POCT URINALYSIS DIPSTICK
BILIRUBIN UA: 1
GLUCOSE UA: NEGATIVE
Ketones, UA: NEGATIVE
Nitrite, UA: NEGATIVE
PROTEIN UA: 1
Spec Grav, UA: 1.015
Urobilinogen, UA: 0.2
pH, UA: 7

## 2015-01-19 MED ORDER — CEFTRIAXONE SODIUM 1 G IJ SOLR
1.0000 g | Freq: Once | INTRAMUSCULAR | Status: AC
  Administered 2015-01-19: 1 g via INTRAMUSCULAR
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
## 2015-01-21 ENCOUNTER — Emergency Department: Payer: PRIVATE HEALTH INSURANCE

## 2015-01-21 ENCOUNTER — Inpatient Hospital Stay
Admission: EM | Admit: 2015-01-21 | Discharge: 2015-01-31 | DRG: 857 | Disposition: A | Discharge status: Home / Self Care | Payer: PRIVATE HEALTH INSURANCE | Attending: Obstetrics and Gynecology | Admitting: Obstetrics and Gynecology

## 2015-01-21 ENCOUNTER — Encounter: Payer: Self-pay | Admitting: Radiology

## 2015-01-21 ENCOUNTER — Other Ambulatory Visit: Payer: Self-pay | Admitting: Obstetrics and Gynecology

## 2015-01-21 DIAGNOSIS — T8130XA Disruption of wound, unspecified, initial encounter: Secondary | ICD-10-CM | POA: Diagnosis present

## 2015-01-21 DIAGNOSIS — A419 Sepsis, unspecified organism: Secondary | ICD-10-CM

## 2015-01-21 DIAGNOSIS — T8140XA Infection following a procedure, unspecified, initial encounter: Secondary | ICD-10-CM | POA: Diagnosis present

## 2015-01-21 DIAGNOSIS — Z833 Family history of diabetes mellitus: Secondary | ICD-10-CM

## 2015-01-21 DIAGNOSIS — L02211 Cutaneous abscess of abdominal wall: Secondary | ICD-10-CM | POA: Diagnosis present

## 2015-01-21 DIAGNOSIS — Z87410 Personal history of cervical dysplasia: Secondary | ICD-10-CM

## 2015-01-21 DIAGNOSIS — K651 Peritoneal abscess: Secondary | ICD-10-CM | POA: Diagnosis not present

## 2015-01-21 DIAGNOSIS — IMO0002 Reserved for concepts with insufficient information to code with codable children: Secondary | ICD-10-CM

## 2015-01-21 DIAGNOSIS — R1900 Intra-abdominal and pelvic swelling, mass and lump, unspecified site: Secondary | ICD-10-CM | POA: Diagnosis present

## 2015-01-21 DIAGNOSIS — Y703 Surgical instruments, materials and anesthesiology devices (including sutures) associated with adverse incidents: Secondary | ICD-10-CM

## 2015-01-21 DIAGNOSIS — T814XXA Infection following a procedure, initial encounter: Principal | ICD-10-CM | POA: Diagnosis present

## 2015-01-21 DIAGNOSIS — K56609 Unspecified intestinal obstruction, unspecified as to partial versus complete obstruction: Secondary | ICD-10-CM

## 2015-01-21 DIAGNOSIS — X58XXXA Exposure to other specified factors, initial encounter: Secondary | ICD-10-CM | POA: Diagnosis present

## 2015-01-21 DIAGNOSIS — R5082 Postprocedural fever: Secondary | ICD-10-CM | POA: Diagnosis present

## 2015-01-21 LAB — COMPREHENSIVE METABOLIC PANEL
ALBUMIN: 3 g/dL — AB (ref 3.5–5.0)
ALT: 76 U/L — ABNORMAL HIGH (ref 14–54)
AST: 39 U/L (ref 15–41)
Alkaline Phosphatase: 278 U/L — ABNORMAL HIGH (ref 38–126)
Anion gap: 10 (ref 5–15)
BUN: 11 mg/dL (ref 6–20)
CO2: 25 mmol/L (ref 22–32)
Calcium: 8.8 mg/dL — ABNORMAL LOW (ref 8.9–10.3)
Chloride: 103 mmol/L (ref 101–111)
Creatinine, Ser: 0.54 mg/dL (ref 0.44–1.00)
GFR calc Af Amer: >60 mL/min (ref >60)
GFR calc non Af Amer: >60 mL/min (ref >60)
Glucose, Bld: 84 mg/dL (ref 65–99)
POTASSIUM: 3.7 mmol/L (ref 3.5–5.1)
SODIUM: 138 mmol/L (ref 135–145)
TOTAL PROTEIN: 7 g/dL (ref 6.5–8.1)
Total Bilirubin: 0.7 mg/dL (ref 0.3–1.2)

## 2015-01-21 LAB — LIPASE, BLOOD: Lipase: 12 U/L — ABNORMAL LOW (ref 22–51)

## 2015-01-21 LAB — CBC WITH DIFFERENTIAL/PLATELET
BASOS ABS: 0.1 10*3/uL (ref 0–0.1)
Basophils Relative: 1 %
EOS PCT: 1 %
Eosinophils Absolute: 0.2 10*3/uL (ref 0–0.7)
HCT: 32.6 % — ABNORMAL LOW (ref 35.0–47.0)
HEMOGLOBIN: 10.8 g/dL — AB (ref 12.0–16.0)
Lymphocytes Relative: 8 %
Lymphs Abs: 1.2 10*3/uL (ref 1.0–3.6)
MCH: 32.6 pg (ref 26.0–34.0)
MCHC: 33.2 g/dL (ref 32.0–36.0)
MCV: 98.2 fL (ref 80.0–100.0)
Monocytes Absolute: 0.7 10*3/uL (ref 0.2–0.9)
Monocytes Relative: 5 %
NEUTROS ABS: 13 10*3/uL — AB (ref 1.4–6.5)
Neutrophils Relative %: 85 %
Platelets: 482 10*3/uL — ABNORMAL HIGH (ref 150–440)
RBC: 3.31 MIL/uL — ABNORMAL LOW (ref 3.80–5.20)
RDW: 13.8 % (ref 11.5–14.5)
WBC: 15.1 10*3/uL — ABNORMAL HIGH (ref 3.6–11.0)

## 2015-01-21 LAB — URINALYSIS COMPLETE WITH MICROSCOPIC (ARMC ONLY)
BACTERIA UA: NONE SEEN
BILIRUBIN URINE: NEGATIVE
Glucose, UA: NEGATIVE mg/dL
KETONES UR: NEGATIVE mg/dL
Nitrite: NEGATIVE
PH: 6 (ref 5.0–8.0)
Protein, ur: 30 mg/dL — AB
Specific Gravity, Urine: 1.018 (ref 1.005–1.030)

## 2015-01-21 LAB — LACTIC ACID, PLASMA: Lactic Acid, Venous: 0.7 mmol/L (ref 0.5–2.0)

## 2015-01-21 MED ORDER — SODIUM CHLORIDE 0.9 % IV BOLUS (SEPSIS)
1000.0000 mL | Freq: Once | INTRAVENOUS | Status: AC
  Administered 2015-01-21: 1000 mL via INTRAVENOUS

## 2015-01-21 MED ORDER — IOHEXOL 240 MG/ML SOLN
25.0000 mL | INTRAMUSCULAR | Status: AC
  Administered 2015-01-21 (×2): 25 mL via ORAL

## 2015-01-21 MED ORDER — IOHEXOL 300 MG/ML  SOLN
100.0000 mL | Freq: Once | INTRAMUSCULAR | Status: AC | PRN
  Administered 2015-01-21: 100 mL via INTRAVENOUS

## 2015-01-21 MED ORDER — VANCOMYCIN HCL IN DEXTROSE 1-5 GM/200ML-% IV SOLN
1000.0000 mg | Freq: Once | INTRAVENOUS | Status: AC
Start: 2015-01-21 — End: 2015-01-21
  Administered 2015-01-21: 1000 mg via INTRAVENOUS
  Filled 2015-01-21: qty 200

## 2015-01-21 MED ORDER — HYDROCODONE-ACETAMINOPHEN 5-325 MG PO TABS
1.0000 | ORAL_TABLET | ORAL | Status: DC | PRN
  Administered 2015-01-21: 2 via ORAL
  Filled 2015-01-21: qty 2

## 2015-01-21 MED ORDER — CEFTRIAXONE SODIUM 1 G IJ SOLR: INTRAMUSCULAR | Status: DC

## 2015-01-21 MED ORDER — PIPERACILLIN-TAZOBACTAM 3.375 G IVPB
3.3750 g | Freq: Once | INTRAVENOUS | Status: AC
  Administered 2015-01-21: 3.375 g via INTRAVENOUS
  Filled 2015-01-21: qty 50

## 2015-01-21 MED ORDER — SODIUM CHLORIDE 0.9 % IV BOLUS (SEPSIS)
1000.0000 mL | Freq: Once | INTRAVENOUS | Status: AC
Start: 2015-01-21 — End: 2015-01-21
  Administered 2015-01-21: 1000 mL via INTRAVENOUS

## 2015-01-21 MED ORDER — ACETAMINOPHEN 500 MG PO TABS
1000.0000 mg | ORAL_TABLET | Freq: Once | ORAL | Status: AC
  Administered 2015-01-21: 1000 mg via ORAL

## 2015-01-21 MED ORDER — MORPHINE SULFATE 4 MG/ML IJ SOLN
4.0000 mg | Freq: Once | INTRAMUSCULAR | Status: AC
  Administered 2015-01-21: 4 mg via INTRAVENOUS
  Filled 2015-01-21: qty 1

## 2015-01-21 NOTE — H&P (Signed)
Cathy Bray is an 29 y.o. female. s/p emergency Cesarean Secrtion on 01/08/2015 presents now for IV antibiotics and possible Laparotomy for intra abdominal infection/abcess. Pt has had waxing and waining fevers and adominal pain which has not responded to PO and IM outpatient regimens. Fevers now to 102 in the ER; WBC elevation;  CT scan is suspicious for an intra abdominal infection with possible abcess formation. Pt has received IV Zosyn and Vancomycin in ER. Zosyn will be continued.  Pertinent Gynecological History:  Menstrual History: Patient's last menstrual period was 07/01/2014.    Past Medical History  Diagnosis Date  . Hematuria   . Second trimester bleeding   . History of LEEP (loop electrosurgical excision procedure) of cervix complicating pregnancy   . Ovarian cyst     LEFT- 2.4CM   . Missed ab   . Infertility, female   . Nausea and vomiting during pregnancy   . Dysplasia of cervix   . S/P urgent classical cesarean section 01/08/2015    Done for malpresentation (fetal arm in vagina) at [redacted]w[redacted]d s/p PPROM      Past Surgical History  Procedure Laterality Date  . Leep  2012    [facility]  . Cesarean section N/A 01/08/2015    Procedure: CESAREAN SECTION;  Surgeon: Tereso Newcomer, MD;  Location: [facility];  Service: Obstetrics;  Laterality: N/A;  classical on uterus     Family History  Problem Relation Age of Onset  . Diabetes Mother   . Heart disease Neg Hx   . Breast cancer Neg Hx   . Colon cancer Neg Hx   . Ovarian cancer Neg Hx     Social History:  reports that she has never smoked. She does not have any smokeless tobacco history on file. She reports that she does not drink alcohol or use illicit drugs.  Allergies:  Allergies  Allergen Reactions  . Shrimp [Shellfish Allergy] Hives     (Not in a hospital admission)  Review of Systems  Constitutional: Positive for fever, chills and diaphoresis.  HENT: Negative.   Respiratory: Negative.   Cardiovascular: Positive for  claudication.  Gastrointestinal: Positive for abdominal pain. Negative for nausea and vomiting.  Genitourinary: Negative for dysuria, urgency, frequency and flank pain.  Musculoskeletal: Negative.   Skin: Negative for itching and rash.    Blood pressure 115/63, pulse 115, temperature 98.3 F (36.8 C), temperature source Oral, resp. rate 16, height 5\' 3"  (1.6 m), weight 175 lb (79.379 kg), last menstrual period 07/01/2014, SpO2 98 %, unknown if currently breastfeeding. Physical Exam  Alert and oriented NAD Lungs clear Cor Tachycardia; no murmur Abd Not distended; supra incisional induration without fluctuance, erythema or heat. Lower quadrants tender without peritoneal signs. extremeties warm/dry  Results for orders placed or performed during the hospital encounter of 01/21/15 (from the past 24 hour(s))  CBC with Differential     Status: Abnormal   Collection Time: 01/21/15  5:00 PM  Result Value Ref Range   WBC 15.1 (H) 3.6 - 11.0 K/uL   RBC 3.31 (L) 3.80 - 5.20 MIL/uL   Hemoglobin 10.8 (L) 12.0 - 16.0 g/dL   HCT 16.1 (L) 09.6 - 04.5 %   MCV 98.2 80.0 - 100.0 fL   MCH 32.6 26.0 - 34.0 pg   MCHC 33.2 32.0 - 36.0 g/dL   RDW 40.9 81.1 - 91.4 %   Platelets 482 (H) 150 - 440 K/uL   Neutrophils Relative % 85 %   Neutro Abs 13.0 (H) 1.4 -  6.5 K/uL   Lymphocytes Relative 8 %   Lymphs Abs 1.2 1.0 - 3.6 K/uL   Monocytes Relative 5 %   Monocytes Absolute 0.7 0.2 - 0.9 K/uL   Eosinophils Relative 1 %   Eosinophils Absolute 0.2 0 - 0.7 K/uL   Basophils Relative 1 %   Basophils Absolute 0.1 0 - 0.1 K/uL  Comprehensive metabolic panel     Status: Abnormal   Collection Time: 01/21/15  5:00 PM  Result Value Ref Range   Sodium 138 135 - 145 mmol/L   Potassium 3.7 3.5 - 5.1 mmol/L   Chloride 103 101 - 111 mmol/L   CO2 25 22 - 32 mmol/L   Glucose, Bld 84 65 - 99 mg/dL   BUN 11 6 - 20 mg/dL   Creatinine, Ser 1.19 0.44 - 1.00 mg/dL   Calcium 8.8 (L) 8.9 - 10.3 mg/dL   Total Protein 7.0  6.5 - 8.1 g/dL   Albumin 3.0 (L) 3.5 - 5.0 g/dL   AST 39 15 - 41 U/L   ALT 76 (H) 14 - 54 U/L   Alkaline Phosphatase 278 (H) 38 - 126 U/L   Total Bilirubin 0.7 0.3 - 1.2 mg/dL   GFR calc non Af Amer >60 >60 mL/min   GFR calc Af Amer >60 >60 mL/min   Anion gap 10 5 - 15  Lipase, blood     Status: Abnormal   Collection Time: 01/21/15  5:00 PM  Result Value Ref Range   Lipase 12 (L) 22 - 51 U/L  Urinalysis complete, with microscopic ([facility] only)     Status: Abnormal   Collection Time: 01/21/15  5:00 PM  Result Value Ref Range   Color, Urine AMBER (A) YELLOW   APPearance CLEAR (A) CLEAR   Glucose, UA NEGATIVE NEGATIVE mg/dL   Bilirubin Urine NEGATIVE NEGATIVE   Ketones, ur NEGATIVE NEGATIVE mg/dL   Specific Gravity, Urine 1.018 1.005 - 1.030   Hgb urine dipstick 3+ (A) NEGATIVE   pH 6.0 5.0 - 8.0   Protein, ur 30 (A) NEGATIVE mg/dL   Nitrite NEGATIVE NEGATIVE   Leukocytes, UA 1+ (A) NEGATIVE   RBC / HPF 6-30 0 - 5 RBC/hpf   WBC, UA 6-30 0 - 5 WBC/hpf   Bacteria, UA NONE SEEN NONE SEEN   Squamous Epithelial / LPF 0-5 (A) NONE SEEN   Mucous PRESENT   Lactic acid, plasma     Status: None   Collection Time: 01/21/15  5:00 PM  Result Value Ref Range   Lactic Acid, Venous 0.7 0.5 - 2.0 mmol/L    Dg Chest 2 View  01/21/2015   CLINICAL DATA:  Cesarean section delivery 01/08/2015 with intermittent fever since that time. Patient completed antibiotic therapy yesterday but still has fevers. Acute onset shortness of breath.  EXAM: CHEST  2 VIEW  COMPARISON:  CTA chest 01/08/2015.  FINDINGS: Suboptimal inspiration accounts for crowded bronchovascular markings, especially in the bases, and accentuates the cardiac silhouette. Taking this into account, cardiomediastinal silhouette unremarkable. Lungs clear. Bronchovascular markings normal. Pulmonary vascularity normal. No visible pleural effusions. No pneumothorax. Visualized bony thorax intact.  IMPRESSION: Suboptimal inspiration.  No acute  cardiopulmonary disease.   Electronically Signed   By: Hulan Saas M.D.   On: 01/21/2015 16:17   Ct Abdomen Pelvis W Contrast  01/21/2015   CLINICAL DATA:  Fever, increased abdominal and pelvic pain since C-section 2 weeks ago  EXAM: CT ABDOMEN AND PELVIS WITH CONTRAST  TECHNIQUE: Multidetector CT imaging  of the abdomen and pelvis was performed using the standard protocol following bolus administration of intravenous contrast.  CONTRAST:  OMNIPAQUE IOHEXOL 300 MG/ML  SOLN  COMPARISON:  None.  FINDINGS: Lower chest:  No acute abnormalities  Hepatobiliary: Significant diffuse hepatic steatosis.  Pancreas: Normal  Spleen: Normal  Adrenals/Urinary Tract: Normal  Stomach/Bowel: Diverticulosis, mild, distal colon with no evidence of diverticulitis. Bowel otherwise appears normal.  Vascular/Lymphatic: Negative  Reproductive: Uterus is enlarged. Arising anteriorly off of the upper body of the uterus there is a round circumscribed low-attenuation lesion measuring 5 x 4 cm with average attenuation of approximately 30. There is inflammatory change within the peritoneal fat anterior to this. There is a small focus of peritoneal fluid immediately anterior to the uterus. The anterior abdominal wall musculature in the midline and to the right of midline is enlarged to 3 cm and heterogeneous in attenuation with areas of low-attenuation. The subcutaneous soft tissues of the anterior pelvic wall show increased attenuation.  Other: No other relevant findings  Musculoskeletal: No acute abnormalities  IMPRESSION: Postoperatively there is increased attenuation in the intra-abdominal fat anterior to the uterus, as well as a small loculated volume of fluid in this area, and evidence of inflammation and edematous change involving the anterior pelvic wall musculature as described above. There is also increased attenuation in the subcutaneous soft tissues. Additionally there is a 5 x 4 cm fluid attenuation lesion in the  myometrium at the level of the inflammatory process.The sterility of this collection is uncertain and an abscess is not excluded, and the possibility of infection involving the adjacent peritoneum and anterior pelvic wall musculature should be considered.   Electronically Signed   By: Esperanza Heir M.D.   On: 01/21/2015 18:23    Assessment/Plan: S/P Classical C/S  PO Fever unresponsive to outpt therapy (PO/IM Antibiotics) Possible intra abdominal abcess/hematoma.  IV antibiotics-Continue Zosyn Recheck CBC in am If no clinical improvement over 24 hours, will proceed with Exploratory Laparotomy.  Daphine Deutscher A [place] Pechacek 01/21/2015, 10:09 PM

## 2015-01-21 NOTE — ED Notes (Signed)
Patient presents with fever off and on since c section delivery on July 11th. Patient reports completing antibiotic yesterday but is not improving and is still running a fever. Took Ibuprofen at 12:45 today and Percocet at 1:15.
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
## 2015-01-22 ENCOUNTER — Encounter: Payer: Self-pay | Admitting: Family Medicine

## 2015-01-22 DIAGNOSIS — Z833 Family history of diabetes mellitus: Secondary | ICD-10-CM | POA: Diagnosis not present

## 2015-01-22 DIAGNOSIS — K651 Peritoneal abscess: Secondary | ICD-10-CM | POA: Diagnosis present

## 2015-01-22 DIAGNOSIS — T8130XA Disruption of wound, unspecified, initial encounter: Secondary | ICD-10-CM | POA: Diagnosis present

## 2015-01-22 DIAGNOSIS — R1900 Intra-abdominal and pelvic swelling, mass and lump, unspecified site: Secondary | ICD-10-CM | POA: Diagnosis present

## 2015-01-22 DIAGNOSIS — X58XXXA Exposure to other specified factors, initial encounter: Secondary | ICD-10-CM | POA: Diagnosis present

## 2015-01-22 DIAGNOSIS — R5082 Postprocedural fever: Secondary | ICD-10-CM | POA: Diagnosis present

## 2015-01-22 DIAGNOSIS — Z87410 Personal history of cervical dysplasia: Secondary | ICD-10-CM | POA: Diagnosis not present

## 2015-01-22 DIAGNOSIS — L02211 Cutaneous abscess of abdominal wall: Secondary | ICD-10-CM | POA: Diagnosis present

## 2015-01-22 DIAGNOSIS — T814XXA Infection following a procedure, initial encounter: Secondary | ICD-10-CM | POA: Diagnosis present

## 2015-01-22 DIAGNOSIS — IMO0002 Reserved for concepts with insufficient information to code with codable children: Secondary | ICD-10-CM | POA: Diagnosis present

## 2015-01-22 LAB — CBC WITH DIFFERENTIAL/PLATELET
BASOS ABS: 0.1 10*3/uL (ref 0–0.1)
BASOS PCT: 0 %
EOS PCT: 1 %
Eosinophils Absolute: 0.2 10*3/uL (ref 0–0.7)
HEMATOCRIT: 32.2 % — AB (ref 35.0–47.0)
HEMOGLOBIN: 10.9 g/dL — AB (ref 12.0–16.0)
Lymphocytes Relative: 7 %
Lymphs Abs: 1.2 10*3/uL (ref 1.0–3.6)
MCH: 33.5 pg (ref 26.0–34.0)
MCHC: 33.7 g/dL (ref 32.0–36.0)
MCV: 99.5 fL (ref 80.0–100.0)
MONO ABS: 1.1 10*3/uL — AB (ref 0.2–0.9)
MONOS PCT: 6 %
NEUTROS PCT: 86 %
Neutro Abs: 15.2 10*3/uL — ABNORMAL HIGH (ref 1.4–6.5)
PLATELETS: 454 10*3/uL — AB (ref 150–440)
RBC: 3.24 MIL/uL — AB (ref 3.80–5.20)
RDW: 13.9 % (ref 11.5–14.5)
WBC: 17.7 10*3/uL — ABNORMAL HIGH (ref 3.6–11.0)

## 2015-01-22 MED ORDER — SODIUM CHLORIDE 0.9 % IV SOLN: 250.0000 mL | INTRAVENOUS | Status: DC | PRN

## 2015-01-22 MED ORDER — LACTATED RINGERS IV SOLN
INTRAVENOUS | Status: DC
  Administered 2015-01-22 – 2015-01-23 (×5): via INTRAVENOUS

## 2015-01-22 MED ORDER — DOCUSATE SODIUM 100 MG PO CAPS: 100.0000 mg | ORAL_CAPSULE | Freq: Two times a day (BID) | ORAL | Status: DC

## 2015-01-22 MED ORDER — DOCUSATE SODIUM 100 MG PO CAPS
100.0000 mg | ORAL_CAPSULE | Freq: Two times a day (BID) | ORAL | Status: DC
  Administered 2015-01-22 – 2015-01-25 (×4): 100 mg via ORAL
  Filled 2015-01-22 (×5): qty 1

## 2015-01-22 MED ORDER — PRENATAL MULTIVITAMIN CH
1.0000 | ORAL_TABLET | Freq: Every day | ORAL | Status: DC
  Administered 2015-01-26 – 2015-01-30 (×4): 1 via ORAL
  Filled 2015-01-22 (×7): qty 1

## 2015-01-22 MED ORDER — ONDANSETRON HCL 4 MG/2ML IJ SOLN
4.0000 mg | Freq: Four times a day (QID) | INTRAMUSCULAR | Status: DC | PRN
Start: 2015-01-22 — End: 2015-01-28
  Administered 2015-01-22 – 2015-01-28 (×14): 4 mg via INTRAVENOUS
  Filled 2015-01-22 (×15): qty 2

## 2015-01-22 MED ORDER — OXYCODONE-ACETAMINOPHEN 5-325 MG PO TABS
1.0000 | ORAL_TABLET | Freq: Four times a day (QID) | ORAL | Status: DC | PRN
  Administered 2015-01-22 – 2015-01-23 (×5): 2 via ORAL
  Filled 2015-01-22 (×7): qty 2

## 2015-01-22 MED ORDER — ACETAMINOPHEN 325 MG PO TABS: 650.0000 mg | ORAL_TABLET | ORAL | Status: DC | PRN

## 2015-01-22 MED ORDER — PIPERACILLIN-TAZOBACTAM 3.375 G IVPB
3.3750 g | Freq: Three times a day (TID) | INTRAVENOUS | Status: DC
  Administered 2015-01-22 – 2015-01-27 (×15): 3.375 g via INTRAVENOUS
  Filled 2015-01-22 (×20): qty 50

## 2015-01-22 MED ORDER — IBUPROFEN 800 MG PO TABS
800.0000 mg | ORAL_TABLET | Freq: Three times a day (TID) | ORAL | Status: DC | PRN
  Administered 2015-01-22 – 2015-01-23 (×2): 800 mg via ORAL
  Filled 2015-01-22 (×4): qty 1

## 2015-01-22 MED ORDER — PIPERACILLIN-TAZOBACTAM 3.375 G IVPB: 3.3750 g | Freq: Four times a day (QID) | INTRAVENOUS | Status: DC

## 2015-01-22 NOTE — Progress Notes (Signed)
Hospital Day 1/ Day 1 Zosyn S: Feeling ok. Still with Rt sided Abdominal pain. O: T max 102.9 at 2200 last PM  BP 114/71 mmHg  Pulse 110  Temp(Src) 98.3 F (36.8 C) (Oral)  Resp 18  Ht  (1.6 m)  Wt 175 lb (79.379 kg)  BMI 31.01 kg/m2  SpO2 98%  LMP 07/01/2014  Pe; unchanged from admission  CBC Latest Ref Rng 01/22/2015 01/21/2015 01/16/2015  WBC 3.6 - 11.0 K/uL 17.7(H) 15.1(H) 6.0  Hemoglobin 12.0 - 16.0 g/dL 10.9(L) 10.8(L) -  Hematocrit 35.0 - 47.0 % 32.2(L) 32.6(L) 32.9(L)  Platelets 150 - 440 K/uL 454(H) 482(H) -   A: Post op infection; Day 1 IV Zosyn with fever curve looking favorable; Ct scan suspicious for possible hematoma or abcess  P: Continue IV Zosyn; recheck daily CBC, follow fever curve; reassess for possible laparotomy on a daily basis.

## 2015-01-23 ENCOUNTER — Encounter
Admission: EM | Disposition: A | Discharge status: Home / Self Care | Payer: Self-pay | Source: Home / Self Care | Attending: Obstetrics and Gynecology

## 2015-01-23 ENCOUNTER — Inpatient Hospital Stay: Payer: PRIVATE HEALTH INSURANCE | Admitting: Anesthesiology

## 2015-01-23 ENCOUNTER — Inpatient Hospital Stay: Payer: PRIVATE HEALTH INSURANCE

## 2015-01-23 DIAGNOSIS — T814XXA Infection following a procedure, initial encounter: Principal | ICD-10-CM

## 2015-01-23 HISTORY — PX: LAPAROTOMY: SHX154

## 2015-01-23 LAB — TYPE AND SCREEN
ABO/RH(D): O POS
ANTIBODY SCREEN: NEGATIVE

## 2015-01-23 LAB — CBC
HCT: 29.4 % — ABNORMAL LOW (ref 35.0–47.0)
HEMOGLOBIN: 10 g/dL — AB (ref 12.0–16.0)
MCH: 33.4 pg (ref 26.0–34.0)
MCHC: 34.1 g/dL (ref 32.0–36.0)
MCV: 97.9 fL (ref 80.0–100.0)
Platelets: 443 10*3/uL — ABNORMAL HIGH (ref 150–440)
RBC: 3 MIL/uL — ABNORMAL LOW (ref 3.80–5.20)
RDW: 14.1 % (ref 11.5–14.5)
WBC: 18 10*3/uL — ABNORMAL HIGH (ref 3.6–11.0)

## 2015-01-23 SURGERY — LAPAROTOMY, EXPLORATORY
Anesthesia: General | Wound class: Dirty or Infected

## 2015-01-23 MED ORDER — ACETAMINOPHEN 325 MG PO TABS
650.0000 mg | ORAL_TABLET | ORAL | Status: DC | PRN
  Administered 2015-01-24 – 2015-01-26 (×2): 650 mg via ORAL
  Filled 2015-01-23 (×2): qty 2

## 2015-01-23 MED ORDER — NEOSTIGMINE METHYLSULFATE 10 MG/10ML IV SOLN
INTRAVENOUS | Status: DC | PRN
  Administered 2015-01-23: 4 mg via INTRAVENOUS

## 2015-01-23 MED ORDER — GLYCOPYRROLATE 0.2 MG/ML IJ SOLN
INTRAMUSCULAR | Status: DC | PRN
  Administered 2015-01-23: 0.6 mg via INTRAVENOUS

## 2015-01-23 MED ORDER — SODIUM CHLORIDE 0.9 % IJ SOLN
9.0000 mL | INTRAMUSCULAR | Status: DC | PRN
  Administered 2015-01-24: 9 mL via INTRAVENOUS
  Filled 2015-01-23: qty 10

## 2015-01-23 MED ORDER — LIDOCAINE HCL (CARDIAC) 20 MG/ML IV SOLN
INTRAVENOUS | Status: DC | PRN
  Administered 2015-01-23: 100 mg via INTRAVENOUS

## 2015-01-23 MED ORDER — LACTATED RINGERS IV SOLN
INTRAVENOUS | Status: DC | PRN
  Administered 2015-01-23: 14:00:00 via INTRAVENOUS

## 2015-01-23 MED ORDER — LACTATED RINGERS IV SOLN
INTRAVENOUS | Status: DC
  Administered 2015-01-23: 17:00:00 via INTRAVENOUS

## 2015-01-23 MED ORDER — FENTANYL CITRATE (PF) 100 MCG/2ML IJ SOLN
25.0000 ug | INTRAMUSCULAR | Status: AC | PRN
  Administered 2015-01-23 (×6): 25 ug via INTRAVENOUS

## 2015-01-23 MED ORDER — HYDROMORPHONE HCL 1 MG/ML IJ SOLN: 1.0000 mg | INTRAMUSCULAR | Status: DC | PRN

## 2015-01-23 MED ORDER — ONDANSETRON HCL 4 MG/2ML IJ SOLN
INTRAMUSCULAR | Status: DC | PRN
  Administered 2015-01-23: 4 mg via INTRAVENOUS

## 2015-01-23 MED ORDER — KETOROLAC TROMETHAMINE 30 MG/ML IJ SOLN: 30.0000 mg | Freq: Four times a day (QID) | INTRAMUSCULAR | Status: AC

## 2015-01-23 MED ORDER — ONDANSETRON HCL 4 MG/2ML IJ SOLN: 4.0000 mg | Freq: Once | INTRAMUSCULAR | Status: DC | PRN

## 2015-01-23 MED ORDER — SIMETHICONE 80 MG PO CHEW
80.0000 mg | CHEWABLE_TABLET | Freq: Four times a day (QID) | ORAL | Status: DC | PRN
  Administered 2015-01-28: 80 mg via ORAL
  Filled 2015-01-23: qty 1

## 2015-01-23 MED ORDER — HYDROMORPHONE HCL 1 MG/ML IJ SOLN
0.5000 mg | INTRAMUSCULAR | Status: AC | PRN
  Administered 2015-01-23 (×4): 0.5 mg via INTRAVENOUS

## 2015-01-23 MED ORDER — LACTATED RINGERS IV SOLN
INTRAVENOUS | Status: DC
  Administered 2015-01-24: 07:00:00 via INTRAVENOUS

## 2015-01-23 MED ORDER — MORPHINE SULFATE 1 MG/ML IV SOLN
INTRAVENOUS | Status: DC
  Administered 2015-01-23 – 2015-01-24 (×3): via INTRAVENOUS
  Filled 2015-01-23 (×3): qty 25

## 2015-01-23 MED ORDER — DEXAMETHASONE SODIUM PHOSPHATE 4 MG/ML IJ SOLN
INTRAMUSCULAR | Status: DC | PRN
  Administered 2015-01-23: 5 mg via INTRAVENOUS

## 2015-01-23 MED ORDER — PROPOFOL 10 MG/ML IV BOLUS
INTRAVENOUS | Status: DC | PRN
  Administered 2015-01-23: 150 mg via INTRAVENOUS

## 2015-01-23 MED ORDER — MORPHINE SULFATE 2 MG/ML IJ SOLN
1.0000 mg | INTRAMUSCULAR | Status: DC | PRN
  Administered 2015-01-23 (×2): 2 mg via INTRAVENOUS
  Filled 2015-01-23 (×2): qty 1

## 2015-01-23 MED ORDER — NALOXONE HCL 0.4 MG/ML IJ SOLN: 0.4000 mg | INTRAMUSCULAR | Status: DC | PRN

## 2015-01-23 MED ORDER — DIPHENHYDRAMINE HCL 50 MG/ML IJ SOLN: 12.5000 mg | Freq: Four times a day (QID) | INTRAMUSCULAR | Status: DC | PRN

## 2015-01-23 MED ORDER — FENTANYL CITRATE (PF) 100 MCG/2ML IJ SOLN
INTRAMUSCULAR | Status: DC | PRN
  Administered 2015-01-23: 50 ug via INTRAVENOUS

## 2015-01-23 MED ORDER — DIPHENHYDRAMINE HCL 12.5 MG/5ML PO ELIX: 12.5000 mg | ORAL_SOLUTION | Freq: Four times a day (QID) | ORAL | Status: DC | PRN

## 2015-01-23 MED ORDER — MIDAZOLAM HCL 2 MG/2ML IJ SOLN
INTRAMUSCULAR | Status: DC | PRN
  Administered 2015-01-23: 2 mg via INTRAVENOUS

## 2015-01-23 MED ORDER — PHENYLEPHRINE HCL 10 MG/ML IJ SOLN
INTRAMUSCULAR | Status: DC | PRN
  Administered 2015-01-23 (×9): 100 ug via INTRAVENOUS

## 2015-01-23 MED ORDER — BISACODYL 10 MG RE SUPP: 10.0000 mg | Freq: Every day | RECTAL | Status: DC | PRN

## 2015-01-23 MED ORDER — KETOROLAC TROMETHAMINE 30 MG/ML IJ SOLN
30.0000 mg | Freq: Four times a day (QID) | INTRAMUSCULAR | Status: AC
  Administered 2015-01-23 – 2015-01-28 (×19): 30 mg via INTRAVENOUS
  Filled 2015-01-23 (×19): qty 1

## 2015-01-23 MED ORDER — ROCURONIUM BROMIDE 100 MG/10ML IV SOLN
INTRAVENOUS | Status: DC | PRN
  Administered 2015-01-23: 20 mg via INTRAVENOUS
  Administered 2015-01-23: 40 mg via INTRAVENOUS
  Administered 2015-01-23 (×2): 10 mg via INTRAVENOUS
  Administered 2015-01-23: 5 mg via INTRAVENOUS

## 2015-01-23 SURGICAL SUPPLY — 25 items
CANISTER SUCT 1200ML W/VALVE (MISCELLANEOUS) ×2 IMPLANT
CATH FOLEY 2WAY  5CC 16FR (CATHETERS) ×1
CATH TRAY 16F METER LATEX (MISCELLANEOUS) IMPLANT
CATH URTH 16FR FL 2W BLN LF (CATHETERS) ×1 IMPLANT
CHLORAPREP W/TINT 26ML (MISCELLANEOUS) ×2 IMPLANT
DRAIN PENROSE 1/4X12 LTX (DRAIN) ×2 IMPLANT
DRAPE LAPAROTOMY TRNSV 106X77 (MISCELLANEOUS) ×2 IMPLANT
DRSG TELFA 3X8 NADH (GAUZE/BANDAGES/DRESSINGS) ×2 IMPLANT
GAUZE SPONGE 4X4 12PLY STRL (GAUZE/BANDAGES/DRESSINGS) ×2 IMPLANT
GLOVE BIO SURGEON STRL SZ8 (GLOVE) ×14 IMPLANT
GOWN STRL REUS W/ TWL LRG LVL3 (GOWN DISPOSABLE) ×5 IMPLANT
GOWN STRL REUS W/TWL LRG LVL3 (GOWN DISPOSABLE) ×5
HANDLE SUCTION POOLE (INSTRUMENTS) ×1 IMPLANT
KIT RM TURNOVER STRD PROC AR (KITS) ×2 IMPLANT
LABEL OR SOLS (LABEL) ×2 IMPLANT
NS IRRIG 1000ML POUR BTL (IV SOLUTION) ×6 IMPLANT
PACK BASIN MAJOR ARMC (MISCELLANEOUS) ×2 IMPLANT
SPONGE LAP 18X18 5 PK (GAUZE/BANDAGES/DRESSINGS) ×6 IMPLANT
STAPLER SKIN PROX 35W (STAPLE) ×4 IMPLANT
SUCTION POOLE HANDLE (INSTRUMENTS) ×2
SUT CHROMIC 0 CT 1 (SUTURE) IMPLANT
SUT CHROMIC GUT 1-0 18 CT-1 (SUTURE) IMPLANT
SUT MAXON ABS #0 GS21 30IN (SUTURE) ×2 IMPLANT
SUT PDS AB 1 TP1 96 (SUTURE) ×6 IMPLANT
SUT PROLENE 1 CT (SUTURE) ×6 IMPLANT

## 2015-01-23 NOTE — Anesthesia Procedure Notes (Signed)
Procedure Name: Intubation Date/Time: 01/23/2015 1:38 PM Performed by: Irving Burton Pre-anesthesia Checklist: Patient identified, Emergency Drugs available, Suction available and Patient being monitored Patient Re-evaluated:Patient Re-evaluated prior to inductionOxygen Delivery Method: Circle system utilized Preoxygenation: Pre-oxygenation with 100% oxygen Intubation Type: IV induction Ventilation: Mask ventilation without difficulty Laryngoscope Size: Mac and 3 Grade View: Grade II Tube type: Oral Tube size: 7.0 mm Number of attempts: 1 Airway Equipment and Method: Stylet Placement Confirmation: ETT inserted through vocal cords under direct vision,  positive ETCO2 and breath sounds checked- equal and bilateral Secured at: 20 cm Tube secured with: Tape Dental Injury: Teeth and Oropharynx as per pre-operative assessment

## 2015-01-23 NOTE — Anesthesia Postprocedure Evaluation (Signed)
  Anesthesia Post-op Note  Patient: Cathy Bray  Procedure(s) Performed: Procedure(s): EXPLORATORY LAPAROTOMY (N/A)  Anesthesia type:General ETT  Patient location: PACU  Post pain: Pain level controlled  Post assessment: Post-op Vital signs reviewed, Patient's Cardiovascular Status Stable, Respiratory Function Stable, Patent Airway and No signs of Nausea or vomiting  Post vital signs: Reviewed and stable  Last Vitals:  Filed Vitals:   01/23/15 1615  BP: 97/58  Pulse: 73  Temp: 37.2 C  Resp: 22    Level of consciousness: awake, alert  and patient cooperative  Complications: No apparent anesthesia complications

## 2015-01-23 NOTE — Transfer of Care (Signed)
Immediate Anesthesia Transfer of Care Note  Patient: Cathy Bray  Procedure(s) Performed: Procedure(s): EXPLORATORY LAPAROTOMY (N/A)  Patient Location: PACU  Anesthesia Type:General  Level of Consciousness: awake  Airway & Oxygen Therapy: Patient Spontanous Breathing and Patient connected to face mask oxygen  Post-op Assessment: Report given to RN and Post -op Vital signs reviewed and stable  Post vital signs: Reviewed and stable  Last Vitals:  Filed Vitals:   01/23/15 1615  BP: 97/58  Pulse: 73  Temp: 37.2 C  Resp: 22    Complications: No apparent anesthesia complications

## 2015-01-23 NOTE — Progress Notes (Signed)
Date of Initial H&P:01/22/2015  Patient has suspected postoperative hematoma/abscess, which is unresponsive to IV antibiotics.  White blood cell count is increasing.  Hemoglobin has decreased.  Surgical intervention is warranted.  Preoperative counseling: The patient is to undergo exploratory laparotomy with evacuation of hematoma/abscess.  Wound debridement may also be necessary.  The patient is understanding of the planned procedures and is aware of and is accepting of all surgical risks which include but are not limited to bleeding, infection, pelvic organ injury with need for repair, blood clot disorders, anesthesia risks, etc.  The patient does understand that given her recent history of low vertical cesarean section, with possible hematoma/abscess within the myometrium suspected on CT scan, there is a possibility that she may need to undergo hysterectomy.  She is aware of this and is accepting if this is the end result.  She is not desiring to have any future children.  All questions have been answered.  Informed consent is given.  Patient is ready and willing to proceed with surgery as scheduled.  History reviewed, patient examined, no change in status, stable for surgery.  Herold Harms, MD

## 2015-01-23 NOTE — Anesthesia Preprocedure Evaluation (Addendum)
Anesthesia Evaluation    Airway Mallampati: II       Dental  (+) Teeth Intact   Pulmonary          Cardiovascular Rhythm:regular Rate:Normal     Neuro/Psych    GI/Hepatic   Endo/Other    Renal/GU      Musculoskeletal   Abdominal   Peds  Hematology   Anesthesia Other Findings   Reproductive/Obstetrics                             Anesthesia Physical Anesthesia Plan  ASA: II  Anesthesia Plan: General ETT   Post-op Pain Management:    Induction:   Airway Management Planned:   Additional Equipment:   Intra-op Plan:   Post-operative Plan:   Informed Consent:   Plan Discussed with: CRNA  Anesthesia Plan Comments:         Anesthesia Quick Evaluation

## 2015-01-23 NOTE — Op Note (Signed)
Intraoperative consult/op note  Procedure performed:  Intermediate repair of abdominal wound > 30 cm Evaluation of questionable bowel  Surgeons: Venda Rodes, Defrancesco, Cherry  Indication for consult: Dr. Greggory Keen had requested an interoperative consult during laparotomy to drain soft tissue abscess.  His specific question dealt with closing a complicated wound and evaluation of bowel during procedure.  Details of procedure is as follows:  I arrived in OR and scrubbed.  I evaluated the cephalad aspect of the wound which was partially cleared off of subcutaneous tissue.  Attempted to mobilize the upper musculofascial flap from the overlying subcutaneous tissue but the planes did not allow for a safe mobilization without fear of further damaging the upper fascia.  I then proceeded to mobilize the lower flap from subcutaneous tissue, with visualization of healthy fascia. We then proceeded to close the musculofascial layers en bloc using two running #1 looped PDS ran from both directions.  We did intermittently place 0-prolene retention sutures while closing the fascia.  The two looped PDS were then tied together and the retention sutures were tied together over red-rubber catheters.  The wound was then loosely approximated with staples with intermittent penrose drains placed.  The wound was then dressed, patient was awoken and brought to the post-anesthesia care unit.

## 2015-01-23 NOTE — Progress Notes (Signed)
Patient ID: Cathy Bray, female   DOB: 02-Oct-1985, 29 y.o.   MRN: [id number] Hospital Day #2 Zosyn Day 2  S: Rough night with Tmax103.2.  Currently NPO. O: BP 99/58 mmHg  Pulse 89  Temp(Src) 97.8 F (36.6 C) (Oral)  Resp 18  Ht  (1.6 m)  Wt 175 lb (79.379 kg)  BMI 31.01 kg/m2  SpO2 100%  LMP 07/01/2014  Abd; supra incisional induration unchanged. No incisional drainage.  CBC Latest Ref Rng 01/23/2015 01/22/2015 01/21/2015  WBC 3.6 - 11.0 K/uL 18.0(H) 17.7(H) 15.1(H)  Hemoglobin 12.0 - 16.0 g/dL 10.0(L) 10.9(L) 10.8(L)  Hematocrit 35.0 - 47.0 % 29.4(L) 32.2(L) 32.6(L)  Platelets 150 - 440 K/uL 443(H) 454(H) 482(H)   A: Suspected Intra abdominal Abcess/Hematoma, non responsive to IV antibiotics; increasing WBC, Dropping HGB.  P: 1. Exploratory Laparotomy today. Counseling completed. OR notified for 1200 start time. Type&Screen ordered.

## 2015-01-23 NOTE — Op Note (Signed)
OPERATIVE NOTE:  Larrisha Babineau PROCEDURE DATE: 01/23/2015   PREOPERATIVE DIAGNOSIS:  1. Postoperative intra-abdominal wound abscess 2. Status post emergent classical cesarean section delivery 01/08/2015 POSTOPERATIVE DIAGNOSIS:  1. Postoperative intra-abdominal wound abscess 2. Status post emergent classical cesarean section delivery 01/08/2015 3. Fascial dehiscence-2 cm PROCEDURE: Exploratory laparotomy with evacuation of abdominal abscess and moderately complex wound closure  SURGEON:  Herold Harms, MD ASSISTANTS: Dr. Valentino Saxon  Intraoperative consultation: General surgery (Dr. Salome Holmes, Dr. Kathlene November) ANESTHESIA: General INDICATIONS: 29 y.o. [id number] status post emergent classical cesarean section delivery on 01/08/2015, who presented for admission through the emergency room with postoperative fever refractory to PO and IM Antibiotics. Emergency room workup demonstrated possible intra-abdominal abscess/hematoma by CT Scan. IV antibiotics were unsuccessful in controlling fever. Due to patient's rising white count and persistent fevers, patient was counseled to undergo laparotomy.  FINDINGS: A 2 cm fascial dehiscence with purulent drainage which was cultured. Intra-abdominal exploration demonstrated markedly inflamed soft tissues fascia omentum etc. The uterus incision was healing without evidence of necrosis. Tubes and ovaries bilaterally were normal. Extensive irrigation of the pelvis was performed. Running of the bowel revealed no significant injury or infection. Due to the extent of inflammatory process involving the fascia, subcutaneous tissues and omentum, decision was made to have intraoperative consultation with general surgery to help facilitate abdominal wall closure.   I/O's: Total I/O In: 2600 [I.V.:2600] Out: 600 [Urine:200; Blood:400] COUNTS:  NO X-ray performed to account for 2 needle drivers and ACTION TAKEN: X-RAY(S) TAKEN ,, ROOM SEARCHED and PATIENT AND/OR  FAMILY AWARE SPECIMENS: Wound culture, aerobic and anaerobic ANTIBIOTIC PROPHYLAXIS:Zosyn ongoing COMPLICATIONS: None immediate  PROCEDURE IN DETAIL: The patient was brought to the operating room and was placed in the supine position. General endotracheal anesthesia was induced without difficulty. A core prep and Betadine abdominal perineal intravaginal prep and drape was performed in standard fashion. A Foley catheter was placed and was draining clear yellow urine. After timeout being taken, the previously healed Pfannenstiel incision was opened with scalpel. Incision was made through the soft tissues down to the fascia at the level of fascia there was noted to be purulent drainage from a 2 cm defect in the central aspect of the fascia. This was cultured for aerobic and anaerobic bacteria. The fascia was opened and evacuation of purulent drainage was accomplished with 50-100 cc of purulent fluid being evacuated. The soft tissues including the omentum, peritoneum, fascia were markedly edematous with mouth appearance creating difficulty in identifying fascial tissue layers. The purulent drainage was malodorous. Copious irrigation of the peritoneal cavity was performed with 2 L of lactated Ringer's fluid which was eventually aspirated. Inspection of the uterus did reveal a classical incision which was healing without evidence of necrosis. Fallopian tubes and ovaries appeared normal bilaterally. The bowel had a normal appearance with one small area of defect with what appeared to be some omentum and fatty tissue attached to the bowel wall. There was no serosal defect. Gen. surgery confirmed this observation. Because of the potential difficulty in closure of the abdomen, Gen. surgery was consulted in order to help restore somewhat normal anatomy order in  to proceed with adequate closure. Tedious dissection of the soft tissues and fascia was performed by Dr. Juliann Pulse and Dr. Inez Catalina. Once flaps were developed  facilitate closure with viable tissue being identified, a moderately complex wound closure was performed with 0 Prolene suture retention sutures. 0 PDS was used to close the skin along with staples. Penrose drains were placed. The  instrument count was off, as two needle holders could not be accounted for; x-ray was performed. Patient was then awakened extubated and taken to the recovery room in satisfactory condition.  Cass Edinger A. Beatris Si, MD, ACOG [facility]

## 2015-01-24 ENCOUNTER — Ambulatory Visit: Payer: No Typology Code available for payment source | Admitting: Obstetrics and Gynecology

## 2015-01-24 ENCOUNTER — Encounter: Payer: Self-pay | Admitting: Obstetrics and Gynecology

## 2015-01-24 LAB — HEMOGLOBIN: HEMOGLOBIN: 10.2 g/dL — AB (ref 12.0–16.0)

## 2015-01-24 MED ORDER — LACTATED RINGERS IV SOLN
INTRAVENOUS | Status: DC
  Administered 2015-01-24 – 2015-01-28 (×9): via INTRAVENOUS

## 2015-01-24 MED ORDER — OXYCODONE-ACETAMINOPHEN 5-325 MG PO TABS
1.0000 | ORAL_TABLET | Freq: Four times a day (QID) | ORAL | Status: DC | PRN
  Administered 2015-01-24 – 2015-01-25 (×2): 2 via ORAL
  Administered 2015-01-26 – 2015-01-27 (×2): 1 via ORAL
  Administered 2015-01-27 – 2015-01-29 (×2): 2 via ORAL
  Administered 2015-01-29: 1 via ORAL
  Administered 2015-01-30 – 2015-01-31 (×5): 2 via ORAL
  Filled 2015-01-24 (×5): qty 2
  Filled 2015-01-24 (×2): qty 1
  Filled 2015-01-24 (×4): qty 2
  Filled 2015-01-24: qty 1

## 2015-01-24 NOTE — Progress Notes (Signed)
1 Day Post-Op Procedure(s) (LRB): EXPLORATORY LAPAROTOMY with evacuation of abscess, intra-abdominal  Subjective: Patient reports incisional pain and tolerating PO.    Objective: I have reviewed patient's vital signs, intake and output, medications and labs.  General: alert, cooperative, fatigued and mild distress Resp: clear to auscultation bilaterally Cardio: regular rate and rhythm GI: soft, non-tender; bowel sounds normal; no masses,  no organomegaly Extremities: extremities normal, atraumatic, no cyanosis or edema  Assessment: s/p Procedure(s): EXPLORATORY LAPAROTOMY (N/A): stable  Plan: Advance diet Encourage ambulation Advance to PO medication Continue ABX therapy due to Post-op infection Continue foley due to strict I&O  General Surgery to manage wound care.  LOS: 2 days    Prentice Docker Kristiana Jacko 01/24/2015, 1:34 PM

## 2015-01-24 NOTE — Progress Notes (Signed)
Surgery Progress Notes  S: C/o pain, low grade fevers this am O: Blood pressure 102/55, pulse 132, temperature 100 F (37.8 C), temperature source Oral, resp. rate 20, height  (1.6 m), weight 79.379 kg (175 lb), last menstrual period 07/01/2014, SpO2 98 %, unknown if currently breastfeeding. GEN: NAD/A&Ox3 ABD: soft, tender to palpation, incision c/d/i with retention sutures and penrose drains  A/P 29 yo s/p ex lap for postop abscess, complicated abdominal closure. Doing well - cont wound care - other care per Defrancesco

## 2015-01-25 LAB — CBC
HCT: 30.1 % — ABNORMAL LOW (ref 35.0–47.0)
Hemoglobin: 9.9 g/dL — ABNORMAL LOW (ref 12.0–16.0)
MCH: 32.5 pg (ref 26.0–34.0)
MCHC: 32.7 g/dL (ref 32.0–36.0)
MCV: 99.2 fL (ref 80.0–100.0)
PLATELETS: 496 10*3/uL — AB (ref 150–440)
RBC: 3.03 MIL/uL — AB (ref 3.80–5.20)
RDW: 14.6 % — ABNORMAL HIGH (ref 11.5–14.5)
WBC: 14.6 10*3/uL — AB (ref 3.6–11.0)

## 2015-01-25 LAB — URINE CULTURE: Organism ID, Bacteria: NO GROWTH

## 2015-01-25 MED ORDER — MAGNESIUM HYDROXIDE 400 MG/5ML PO SUSP
30.0000 mL | Freq: Every day | ORAL | Status: DC
  Administered 2015-01-25 – 2015-01-28 (×2): 30 mL via ORAL
  Filled 2015-01-25 (×2): qty 30

## 2015-01-25 MED ORDER — DOCUSATE SODIUM 100 MG PO CAPS
100.0000 mg | ORAL_CAPSULE | Freq: Two times a day (BID) | ORAL | Status: DC
  Administered 2015-01-25 – 2015-01-31 (×6): 100 mg via ORAL
  Filled 2015-01-25 (×7): qty 1

## 2015-01-25 NOTE — Progress Notes (Signed)
2 Days Post-Op Procedure(s) (LRB): EXPLORATORY LAPAROTOMY with Abdominal abcess evacuation. 3 Days IV Zosyn  Subjective: Patient reports tolerating PO and no problems voiding.    Objective: I have reviewed patient's vital signs, intake and output, medications and labs.  General: alert, cooperative and mild distress GI: soft, non-tender; bowel sounds normal; no masses,  no organomegaly and incision: intact and serous drainage present Extremities: extremities normal, atraumatic, no cyanosis or edema      Assessment: s/p Procedure(s): EXPLORATORY LAPAROTOMY (N/A): Fever curve decreasing. WBC decreasing   Plan: Advance diet Encourage ambulation Advance to PO medication  Wound Care per general surgery. Continue Zosyn  LOS: 3 days    Prentice Docker Sheralee Qazi 01/25/2015, 1:22 PM

## 2015-01-25 NOTE — Progress Notes (Signed)
Pt reports feeling nauseous;  Zofran available pt declined any at this time

## 2015-01-25 NOTE — Progress Notes (Signed)
Surgery Progress Note  S: Feels better.  No fevers overnight O:Blood pressure 115/65, pulse 99, temperature 97.9 F (36.6 C), temperature source Oral, resp. rate 18, height  (1.6 m), weight 79.379 kg (175 lb), last menstrual period 07/01/2014, SpO2 100 %, unknown if currently breastfeeding. GEN: NAD/A&Ox3 ABD: soft, approp tender, incision with some dainage but min redness  A/P 29 yo s/p ex lap, wound closure for abdominal wound infection, doing well - no acute surgery issues

## 2015-01-26 LAB — CULTURE, BLOOD (ROUTINE X 2)
CULTURE: NO GROWTH
Culture: NO GROWTH

## 2015-01-26 LAB — WOUND CULTURE

## 2015-01-26 NOTE — Progress Notes (Signed)
POD#3 S/P EX Lap with Abdominal  Abcess Evacuation Day #4 Zosyn  S: No appetite. Occasional bilious emesis. Did have BM and is passing flatus. O: AFEBRILE 24 hours  WOUND Culture: + GBS  Abd soft; no distension.  Wound closed with retention sutures; JP draining serous fluid- minimal. No induration or erythema. Sutures intact. Ext nontender A: Good response to Abcess Evacuation and IV Zosyn; Nausea maybe related to meds; no evidence of ileus/SBO. P:  Scheduled Zofran q 6 hours. Ambulate Continue IV Zosyn. Wound Care per Gen Surgery.

## 2015-01-26 NOTE — Progress Notes (Signed)
Surgery Progress Note  S: Some nausea/emesis/belching. Ambulated.  Pain improving.  No fevers. O:Blood pressure 115/72, pulse 95, temperature 98.5 F (36.9 C), temperature source Oral, resp. rate 16, height  (1.6 m), weight 79.379 kg (175 lb), last menstrual period 07/01/2014, SpO2 100 %, unknown if currently breastfeeding.  GEN: NAD/A&Ox3 ABD: soft, mild tender, nondistended, incision c/d/i with retention sutures and penrose drains  A/P 29 yo s/p ex lap for infected hematoma s/p complex wound closure, doing well - no acute wound issues

## 2015-01-27 LAB — CBC WITH DIFFERENTIAL/PLATELET
BASOS ABS: 0 10*3/uL (ref 0–0.1)
BASOS PCT: 0 %
Eosinophils Absolute: 0.1 10*3/uL (ref 0–0.7)
Eosinophils Relative: 1 %
HCT: 25.1 % — ABNORMAL LOW (ref 35.0–47.0)
Hemoglobin: 8.3 g/dL — ABNORMAL LOW (ref 12.0–16.0)
LYMPHS PCT: 11 %
Lymphs Abs: 0.8 10*3/uL — ABNORMAL LOW (ref 1.0–3.6)
MCH: 32.2 pg (ref 26.0–34.0)
MCHC: 33.1 g/dL (ref 32.0–36.0)
MCV: 97.5 fL (ref 80.0–100.0)
Monocytes Absolute: 0.5 10*3/uL (ref 0.2–0.9)
Monocytes Relative: 7 %
NEUTROS ABS: 6.2 10*3/uL (ref 1.4–6.5)
NEUTROS PCT: 81 %
Platelets: 531 10*3/uL — ABNORMAL HIGH (ref 150–440)
RBC: 2.58 MIL/uL — AB (ref 3.80–5.20)
RDW: 14.5 % (ref 11.5–14.5)
WBC: 7.7 10*3/uL (ref 3.6–11.0)

## 2015-01-27 LAB — C DIFFICILE QUICK SCREEN W PCR REFLEX
C DIFFICLE (CDIFF) ANTIGEN: NEGATIVE
C Diff interpretation: NEGATIVE
C Diff toxin: NEGATIVE

## 2015-01-27 LAB — CULTURE, BLOOD (SINGLE): CULTURE: NO GROWTH

## 2015-01-27 MED ORDER — VANCOMYCIN 50 MG/ML ORAL SOLUTION
125.0000 mg | Freq: Four times a day (QID) | ORAL | Status: DC
Start: 2015-01-27 — End: 2015-01-28
  Administered 2015-01-27 – 2015-01-28 (×4): 125 mg via ORAL
  Filled 2015-01-27 (×9): qty 2.5

## 2015-01-27 NOTE — Progress Notes (Signed)
POD #4 S/P Laparotomy with Evacuation of Intraabdominal  Abcess - GBS + Culture Day # 5 Zosyn Afebrile x 54 hours  S:Still with nausea; emesis x 2 yesterday; now with diarrhea.  O:  BP 129/83 mmHg  Pulse 81  Temp(Src) 98.4 F (36.9 C) (Oral)  Resp 18  Ht  (1.6 m)  Wt 175 lb (79.379 kg)  BMI 31.01 kg/m2  SpO2 100%  LMP 07/01/2014 Wound - healing without induration/erythema; still with penrose drain. Abd - soft and non distended. Ext nontender  A: Good Progress.      Slow return of Bowel functioon.      New onset diarrhea  P: 1. CBC 2. Stool for C. Dif rule out 3. Stop Zosyn. 4. Start Vancomycin 125 mg BID x 14 days (covers C. Dif and GBS). 5. Possible D/C tomorrow  Herold Harms, MD

## 2015-01-27 NOTE — Progress Notes (Signed)
Surgery Progress Note  S: + nausea, + diarrhea O: Blood pressure 129/83, pulse 81, temperature 98.4 F (36.9 C), temperature source Oral, resp. rate 18, height  (1.6 m), weight 79.379 kg (175 lb), last menstrual period 07/01/2014, SpO2 100 %, unknown if currently breastfeeding. GEN: NAD/A&Ox3 ABD: soft, approp tender, nondistended, incision c/d/i with penrose drains, some serous drainage  WBC 7.7  A/P: 29 yo s/p ex lap/abscess drainage - agree with checking c diff - no acute wound issues

## 2015-01-28 LAB — ANAEROBIC CULTURE

## 2015-01-28 MED ORDER — ONDANSETRON 4 MG PO TBDP
4.0000 mg | ORAL_TABLET | Freq: Four times a day (QID) | ORAL | Status: DC | PRN
  Filled 2015-01-28 (×2): qty 1

## 2015-01-28 MED ORDER — CEPHALEXIN 500 MG PO CAPS
500.0000 mg | ORAL_CAPSULE | Freq: Two times a day (BID) | ORAL | Status: DC
  Administered 2015-01-28 – 2015-01-31 (×7): 500 mg via ORAL
  Filled 2015-01-28 (×7): qty 1

## 2015-01-28 MED ORDER — ENSURE ENLIVE PO LIQD
237.0000 mL | Freq: Two times a day (BID) | ORAL | Status: DC
  Administered 2015-01-28 – 2015-01-30 (×5): 237 mL via ORAL

## 2015-01-28 NOTE — Progress Notes (Signed)
Surgery progress note  S: No acute issues, still sore, c/o gas, belching O:Blood pressure 120/78, pulse 68, temperature 98.5 F (36.9 C), temperature source Oral, resp. rate 19, height  (1.6 m), weight 79.379 kg (175 lb), last menstrual period 07/01/2014, SpO2 100 %, unknown if currently breastfeeding. GEN: NAD/A&Ox3 ABD: soft, min tender, penroses out, incision c/d/i with some serous drainage  A/P 29 yo s/p ex lap for postop abscess, complex wound closure] - no acute surgical issues

## 2015-01-28 NOTE — Progress Notes (Signed)
   Expand All Collapse All   POD #5 S/P Laparotomy with Evacuation of Intraabdominal Abcess - GBS + Culture Day # 5 Zosyn (D/C'd 7/30); Day 1 Vancomycin Afebrile > 72 hours.  S:Still with nausea; diarrhea resolving; C. difficile titer is negative. Appetite is still decreased.  O:  BP 120/78 mmHg  Pulse 68  Temp(Src) 98.5 F (36.9 C) (Oral)  Resp 19  Ht  (1.6 m)  Wt 175 lb (79.379 kg)  BMI 31.01 kg/m2  SpO2 100%  LMP 07/01/2014  Wound - healing without induration/erythema; penrose drain. removed. Abd - soft and non distended. Ext nontender ; 2 +edema  A: Good Progress.  Slow return of Bowel function.  Diarrhea resolving; C. difficile titer negative.   P:  1. Stop vancomycin (C. difficile negative) 2. Start Keflex 500 mg by mouth twice a day for GBS coverage 3. Decreased IV rate to KVO 4. Continue IV and Zofran anti-nausea therapy to help with advancement of diet 5. Wound care per general surgery 6. Dr. Valentino Saxon will take over GYN coverage tomorrow due to my vacation.  Luqman Perrelli, MD

## 2015-01-29 DIAGNOSIS — K651 Peritoneal abscess: Secondary | ICD-10-CM

## 2015-01-29 MED ORDER — HYDROCORTISONE 1 % EX CREA
TOPICAL_CREAM | Freq: Two times a day (BID) | CUTANEOUS | Status: DC
  Administered 2015-01-29 – 2015-01-30 (×3): via TOPICAL
  Filled 2015-01-29: qty 28

## 2015-01-29 MED ORDER — DIPHENHYDRAMINE HCL 25 MG PO CAPS
25.0000 mg | ORAL_CAPSULE | Freq: Four times a day (QID) | ORAL | Status: DC | PRN
  Administered 2015-01-29: 25 mg via ORAL
  Filled 2015-01-29: qty 1

## 2015-01-29 MED ORDER — TRIPLE ANTIBIOTIC 3.5-400-5000 EX OINT
TOPICAL_OINTMENT | Freq: Two times a day (BID) | CUTANEOUS | Status: DC
  Administered 2015-01-29 – 2015-01-30 (×4): 1 via TOPICAL
  Filled 2015-01-29 (×4): qty 1

## 2015-01-29 NOTE — Progress Notes (Signed)
Area above incision appears edematous and is hard to touch.  Pt reports slightly increased tenderness.  Skin on upper right abdomen has mild localized rash.  Temperature of 100.3 at 0400, which was 99 this morning.  Dr. Valentino Saxon and Dr. Egbert Garibaldi notified.  Dr. Valentino Saxon in to assess pt.  Orders received for Benadryl and Hydrocortisone cream for rash.  No further orders received regarding incision. Reynold Bowen, RN 01/29/2015

## 2015-01-29 NOTE — Progress Notes (Signed)
POD #6 S/P Laparotomy with Evacuation of Intraabdominal Abcess - GBS + Culture Day # 2 Keflex  Afebrile > 96 hours.  S: Appetite is still decreased.  Complains of rash on right side of abdomen, itching.  Also with left area of skin irritation and burning.    O:  Temp:  [98.6 F (37 C)-100.3 F (37.9 C)] 99 F (37.2 C) (08/01 0824) Pulse Rate:  [65-94] 94 (08/01 0824) Resp:  [16-18] 16 (08/01 0824) BP: (100-138)/(53-78) 122/77 mmHg (08/01 0824) SpO2:  [93 %-100 %] 93 % (08/01 0824)  Physical Exam:  Gen App: NAD Abd: Area with left area of skin irritation ~ 3 cm above and lateral to incision.  Also with small macular patch on right lateral abdomen. Wound - healing without erythema.  Induration above incision noted, mildly tender; Drainage present, serosanguinous on bandage. Abd - soft and non distended. Ext nontender ; 2 +edema  A: Slow return of Bowel function.  Incision site with new area of induration     Borderline temp noted overnight.   P:  1. Stop vancomycin (C. difficile negative) 2. ContinueKeflex 500 mg by mouth twice a day for GBS coverage 3. Benadryl and hydrocortisone cream for mild skin irritation on left.  Neosporin for area of possible skin tape reaction on right.  4. Continue IV and Zofran anti-nausea therapy to help with advancement of diet 5. Wound care per general surgery  Hildred Laser, MD [facility]

## 2015-01-29 NOTE — Progress Notes (Signed)
Patient ID: Cathy Bray, female   DOB: 09-21-1985, 29 y.o.   MRN: [id number]   POD 6 POD 21  Asked to see by OB/GYN regarding change in appearance of lower transverse wound Some serous drainage  Exam,  abd soft, retention sutures in place. Some induration along superior aspect of wound but cannot express drainage  No cellulitis present.  Will re-check next 24 hrs,  Explained may need staples removed and open packing.

## 2015-01-30 MED ORDER — HEPARIN SODIUM (PORCINE) 5000 UNIT/ML IJ SOLN
5000.0000 [IU] | Freq: Three times a day (TID) | INTRAMUSCULAR | Status: DC
  Administered 2015-01-30 – 2015-01-31 (×3): 5000 [IU] via SUBCUTANEOUS
  Filled 2015-01-30 (×3): qty 1

## 2015-01-30 NOTE — Progress Notes (Signed)
POD #6 S/P Laparotomy with Evacuation of Intraabdominal Abcess - GBS + Culture Day # 2 Keflex (previoyusly on IV antibiotics of Vancomycin and Zosyn)   S: Denies major complaints.     O:  Temp:  [98.3 F (36.8 C)-99.4 F (37.4 C)] 98.6 F (37 C) (08/02 1103) Pulse Rate:  [71-87] 82 (08/02 1103) Resp:  [16-18] 18 (08/02 1103) BP: (111-138)/(62-78) 111/69 mmHg (08/02 1103) SpO2:  [98 %-100 %] 99 % (08/02 1103)  Physical Exam:  Gen App: NAD Abd: Area with left area of skin irritation ~ 3 cm above and lateral to incision.  Also with small macular patch on right lateral abdomen. Wound - healing without erythema.  Induration above entire length of incision noted ~3 cm affected , mildly tender; Small amount of serosanguinous drainage on bandage. Present.  Abd - soft and non distended. Ext nontender ; 2 +edema  A:  Incision site with new area of induration Remains afebrile       P: 1. S/p wound check by General Surgery yesterday.  Note that they may have to reopen incision and perform wound packing, but will f/u today prior to making decision. Continue wound care per general surgery 2. ContinueKeflex 500 mg by mouth twice a day for GBS coverage 3. Continue to advance diet as tolerated.   Hildred Laser, MD [facility]

## 2015-01-30 NOTE — Progress Notes (Signed)
Initial Nutrition Assessment    INTERVENTION:  Meals and snacks: Cater to pt preferences Nutrition Supplement therapy: agree with addition of Ensure Enlive po BID, each supplement provides 350 kcal and 20 grams of protein Nutrition diet education: Discussed high protein foods for wound healing. Pt verbalized understanding and expect good compliance   NUTRITION DIAGNOSIS:   Inadequate oral intake related to acute illness as evidenced by meal completion < 50%.    GOAL:   Patient will meet greater than or equal to 90% of their needs    MONITOR:    (Energy intake)  REASON FOR ASSESSMENT:   LOS    ASSESSMENT:   Pt admitted with post op wound infection. S/p laparotomy with evacuation of intraabdominal abscess  Past Medical History  Diagnosis Date  . Hematuria   . Second trimester bleeding   . History of LEEP (loop electrosurgical excision procedure) of cervix complicating pregnancy   . Ovarian cyst     LEFT- 2.4CM   . Missed ab   . Infertility, female   . Nausea and vomiting during pregnancy   . Dysplasia of cervix   . S/P urgent classical cesarean section 01/08/2015    Done for malpresentation (fetal arm in vagina) at [redacted]w[redacted]d s/p PPROM      Current Nutrition: no breakfast eaten yet, full tray at bedside.  Reports intake is improving, drank ensure yesterday  Food/Nutrition-Related History: Pt reports for the past week decreased intake secondary to not feeling well and recent fever   Medications: dulcolax, colace, MVI, mylicon  Electrolyte/Renal Profile and Glucose Profile:  No results for input(s): NA, K, CL, CO2, BUN, CREATININE, CALCIUM, MG, PHOS, GLUCOSE in the last 168 hours. Protein Profile: No results for input(s): ALBUMIN in the last 168 hours.   Last BM:8/1    Diet Order:  Diet regular Room service appropriate?: Yes; Fluid consistency:: Thin  Skin:   reviewed   Height:   Ht Readings from Last 1 Encounters:  01/21/15  (1.6 m)    Weight:    Wt Readings from Last 1 Encounters:  01/21/15 175 lb (79.379 kg)      BMI:  Body mass index is 31.01 kg/(m^2).   EDUCATION NEEDS:   Education needs addressed  LOW Care Level Linzy Darling B. Freida Busman, RD, LDN [phone] (pager)
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
## 2015-01-31 MED ORDER — OXYCODONE-ACETAMINOPHEN 5-325 MG PO TABS: 1.0000 | ORAL_TABLET | Freq: Four times a day (QID) | ORAL | Status: DC | PRN

## 2015-01-31 MED ORDER — PRENATAL MULTIVITAMIN CH: 1.0000 | ORAL_TABLET | Freq: Every day | ORAL | Status: DC

## 2015-01-31 MED ORDER — TRIPLE ANTIBIOTIC 3.5-400-5000 EX OINT: 1.0000 "application " | TOPICAL_OINTMENT | Freq: Two times a day (BID) | CUTANEOUS | Status: DC

## 2015-01-31 MED ORDER — IBUPROFEN 800 MG PO TABS: 800.0000 mg | ORAL_TABLET | Freq: Three times a day (TID) | ORAL | Status: DC | PRN

## 2015-01-31 MED ORDER — HYDROCORTISONE 1 % EX CREA: TOPICAL_CREAM | Freq: Two times a day (BID) | CUTANEOUS | Status: DC | PRN

## 2015-01-31 MED ORDER — CEPHALEXIN 500 MG PO CAPS: 500.0000 mg | ORAL_CAPSULE | Freq: Two times a day (BID) | ORAL | Status: DC

## 2015-01-31 MED ORDER — ENSURE ENLIVE PO LIQD: 237.0000 mL | Freq: Two times a day (BID) | ORAL | Status: DC

## 2015-01-31 MED ORDER — DOCUSATE SODIUM 100 MG PO CAPS: 100.0000 mg | ORAL_CAPSULE | Freq: Two times a day (BID) | ORAL | Status: DC | PRN

## 2015-01-31 NOTE — Progress Notes (Signed)
POD #7 S/P Laparotomy with Evacuation of Intraabdominal Abcess - GBS + Culture Day # 3 Keflex (previoyusly on IV antibiotics of Vancomycin and Zosyn)   S: Denies major complaints.     O:  Temp:  [97.6 F (36.4 C)-98.6 F (37 C)] 98.5 F (36.9 C) (08/03 0724) Pulse Rate:  [77-101] 77 (08/03 0724) Resp:  [18] 18 (08/03 0724) BP: (108-124)/(69-72) 108/70 mmHg (08/03 0724) SpO2:  [97 %-100 %] 100 % (08/03 0724)  Physical Exam:  Gen App: NAD Abd: Area with left area of skin irritation, improving.  Also with small macular patch on right lateral abdomen, improving. Wound - healing without erythema.  Induration above entire length of incision noted ~3 cm affected , nontender; Scant amount of serosanguinous drainage on bandage. Present.  Abd - soft and non distended. Ext nontender ; 2 +edema  A:  Incision site with area of induration, nontender, nonfluctuant.  No evidence of infection.  Remains afebrile       P: 1. S/p wound check again by General Surgery yesterday.  Note that there are no signs of infection, wound not concerning.  Can d/c and f/u in 1 week.  2. ContinueKeflex 500 mg by mouth twice a day for GBS coverage 3. Will d/c home today.   Hildred Laser, MD [facility]

## 2015-01-31 NOTE — Progress Notes (Signed)
Pt discharged at this time via wheelchair escorted by hospital volunteer; pt going home with her mother

## 2015-01-31 NOTE — Discharge Summary (Signed)
Physician Discharge Summary  Patient ID: Cathy Bray MRN: [id number] DOB/AGE: Oct 22, 1985 29 y.o.  Admit date: 01/21/2015 Discharge date: 01/31/2015  Admission Diagnoses: Abdominal abscess [K65.1] Sepsis, due to unspecified organism [A41.9]   Discharge Diagnoses:  Active Problems:   Post op infection   Abdominal abscess   Discharged Condition: good  Hospital Course:   Cathy Bray is a 29 y.o. [phone] who was admitted for febrile morbidity and abdominal pain.  She was diagnosed with ana abdominal wall abscess on CT scan, and was  treated for 24 hours with antibiotics with no resolution of symptoms.  She then underwent exploratory laparotomy surgery on HD#2 with findings of an abdominal abscess.  General Surgery was consulted intraoperatively.   She underwent the procedures as mentioned above.  For further details about surgery, please refer to the operative report. Patient's post-operative course was relatively uncomplicated. By time of discharge on POD#7 (HD#8), her pain was controlled on oral pain medications; she was ambulating, voiding without difficulty, tolerating regular diet and passing flatus. She was deemed stable for discharge to home.    Consults: general surgery  Significant Diagnostic Studies: labs: CBC, microbiology: blood culture: negative and wound culture: positive for Group B Strep and radiology: CT scan: abdomen/pelvis   CBC Latest Ref Rng 01/27/2015 01/25/2015 01/24/2015  WBC 3.6 - 11.0 K/uL 7.7 14.6(H) -  Hemoglobin 12.0 - 16.0 g/dL 8.3(L) 9.9(L) 10.2(L)  Hematocrit 35.0 - 47.0 % 25.1(L) 30.1(L) -  Platelets 150 - 440 K/uL 531(H) 496(H) -   CT scan Abdomen/Pelvis 01/21/2015:  IMPRESSION: Postoperatively there is increased attenuation in the intra-abdominal fat anterior to the uterus, as well as a small loculated volume of fluid in this area, and evidence of inflammation and edematous change involving the anterior pelvic wall musculature as described above. There is  also increased attenuation in the subcutaneous soft tissues. Additionally there is a 5 x 4 cm fluid attenuation lesion in the myometrium at the level of the inflammatory process.The sterility of this collection is uncertain and an abscess is not excluded, and the possibility of infection involving the adjacent peritoneum and anterior pelvic wall musculature should be considered.  Treatments: IV hydration, antibiotics: vancomycin , Zosyn, Keflex and surgery: exploratory laparotomy  Discharge Exam: Blood pressure 108/70, pulse 77, temperature 98.5 F (36.9 C), temperature source Oral, resp. rate 18, height 5\' 3"  (1.6 m), weight 175 lb (79.379 kg), last menstrual period 07/01/2014, SpO2 100 %, unknown if currently breastfeeding. Gen App: NAD CVS: S1 and S2 present.  RRR, no m/r/g Lungs: CTAB Abd: Area with left area of skin irritation, improving. Also with small macular patch on right lateral abdomen, improving. Wound - healing without erythema. Induration above entire length of incision noted ~3 cm affected , nontender; Scant amount of serosanguinous drainage on bandage. Present. Abd - soft and non distended. Ext nontender ; 2 +edema   Disposition: 01-Home or Self Care     Medication List    TAKE these medications        cephALEXin 500 MG capsule  Commonly known as:  KEFLEX  Take 1 capsule (500 mg total) by mouth every 12 (twelve) hours.     docusate sodium 100 MG capsule  Commonly known as:  COLACE  Take 1 capsule (100 mg total) by mouth 2 (two) times daily as needed.     feeding supplement (ENSURE ENLIVE) Liqd  Take 237 mLs by mouth 2 (two) times daily between meals.     hydrocortisone cream 1 %  Apply  topically 2 (two) times daily as needed for itching. For right abdomen rash. Don't use longer than 1 week.     ibuprofen 800 MG tablet  Commonly known as:  ADVIL,MOTRIN  Take 1 tablet (800 mg total) by mouth every 8 (eight) hours as needed for fever.      oxyCODONE-acetaminophen 5-325 MG per tablet  Commonly known as:  PERCOCET/ROXICET  Take 1-2 tablets by mouth every 6 (six) hours as needed for moderate pain.     prenatal multivitamin Tabs tablet  Take 1 tablet by mouth daily at 12 noon.     TRIPLE ANTIBIOTIC [phone] Oint  Apply 1 application topically 2 (two) times daily. To left abdomen near rash.           Follow-up Information    Follow up with Natale Lay, MD In 1 week.   Specialties:  Surgery, Radiology   Why:  For suture removal, For wound re-check   Contact information:   [address] [place] [address] [phone]       Follow up with Herold Harms, MD In 2 weeks.   Specialties:  Obstetrics and Gynecology, Radiology   Why:  For wound re-check and postpartum visit   Contact information:   [address] [place] [address] [phone]       Signed: Hildred Laser 01/31/2015, 7:59 AM

## 2015-01-31 NOTE — Discharge Instructions (Signed)
Incision Care °An incision is when a surgeon cuts into your body tissues. After surgery, the incision needs to be cared for properly to prevent infection.  °HOME CARE INSTRUCTIONS  °· Take all medicine as directed by your caregiver. Only take over-the-counter or prescription medicines for pain, discomfort, or fever as directed by your caregiver. °· Do not remove your bandage (dressing) or get your incision wet until your surgeon gives you permission. In the event that your dressing becomes wet, dirty, or starts to smell, change the dressing and call your surgeon for instructions as soon as possible. °· Take showers. Do not take tub baths, swim, or do anything that may soak the wound until it is healed. °· Resume your normal diet and activities as directed or allowed. °· Avoid lifting any weight until you are instructed otherwise. °· Use anti-itch antihistamine medicine as directed by your caregiver. The wound may itch when it is healing. Do not pick or scratch at the wound. °· Follow up with your caregiver for stitch (suture) or staple removal as directed. °· Drink enough fluids to keep your urine clear or pale yellow. °SEEK MEDICAL CARE IF:  °· You have redness, swelling, or increasing pain in the wound that is not controlled with medicine. °· You have drainage, blood, or pus coming from the wound that lasts longer than 1 day. °· You develop muscle aches, chills, or a general ill feeling. °· You notice a bad smell coming from the wound or dressing. °· Your wound edges separate after the sutures, staples, or skin adhesive strips have been removed. °· You develop persistent nausea or vomiting. °SEEK IMMEDIATE MEDICAL CARE IF:  °· You have a fever. °· You develop a rash. °· You develop dizzy episodes or faint while standing. °· You have difficulty breathing. °· You develop any reaction or side effects to medicine given. °MAKE SURE YOU:  °· Understand these instructions. °· Will watch your condition. °· Will get help  right away if you are not doing well or get worse. °Document Released: 01/03/2005 Document Revised: 09/08/2011 Document Reviewed: 08/10/2013 °Exit[employer]® Patient Information ©2015 [employer]. This information is not intended to replace advice given to you by your health care provider. Make sure you discuss any questions you have with your health care provider. ° ° °

## 2015-02-02 ENCOUNTER — Encounter: Payer: Self-pay | Admitting: Obstetrics and Gynecology

## 2015-02-02 ENCOUNTER — Ambulatory Visit (INDEPENDENT_AMBULATORY_CARE_PROVIDER_SITE_OTHER): Payer: No Typology Code available for payment source | Admitting: Obstetrics and Gynecology

## 2015-02-02 ENCOUNTER — Telehealth: Payer: Self-pay | Admitting: Obstetrics and Gynecology

## 2015-02-02 ENCOUNTER — Telehealth: Payer: Self-pay | Admitting: Surgery

## 2015-02-02 VITALS — BP 114/78 | HR 85 | Temp 98.5°F | Wt 174.0 lb

## 2015-02-02 DIAGNOSIS — K651 Peritoneal abscess: Secondary | ICD-10-CM

## 2015-02-02 DIAGNOSIS — IMO0002 Reserved for concepts with insufficient information to code with codable children: Secondary | ICD-10-CM

## 2015-02-02 DIAGNOSIS — Z5189 Encounter for other specified aftercare: Secondary | ICD-10-CM

## 2015-02-02 NOTE — Telephone Encounter (Signed)
Incision open and draining more, is on the right where staple is.  No fever. Pain is same and pain pills are helping. Dr. Marlowe Kays office informed pt to contact [facility] to be seen. Appt. Given at 1:30 for workin today.

## 2015-02-02 NOTE — Telephone Encounter (Signed)
PT CALLED AND SHE WOULD LIKE A CALL BACK, SHE HAD SURGERY LAST WEEK AND SHE FEELS THAT SHE IS HAVING MORE DRAINAGE ON HER SIDE NOW THEN WHAT SHE HAD IN THE HOSPITAL AND SHE WANTED TO KNOW IF THIS IS NORMAL.

## 2015-02-02 NOTE — Telephone Encounter (Signed)
Spoke with patient at this time explained that since Dr. Greggory Keen had done initial portion of surgery and that we were just consulted, she would have to follow-up with him in regards to any problems.  Verbalizes understanding and will call Dr. Greggory Keen at this time.

## 2015-02-02 NOTE — Telephone Encounter (Signed)
Patient called this morning and having a little more drainage and is concerned. She said no more pain than usual and no swelling. She would like to know if she should come in to see the doctor. Please advise.

## 2015-02-04 NOTE — Addendum Note (Signed)
Addendum  created 02/04/15 0951 by Yevette Edwards, MD   Modules edited: Anesthesia Events, Narrator   Narrator:  Narrator: Event Log Edited

## 2015-02-04 NOTE — Progress Notes (Signed)
Subjective:     Cathy Bray presents to the clinic 10 days following exploratory laparotomy for abdominal wall abscess. Eating a regular diet without difficulty (although appetite still mildly decreased). Bowel movements are Normal.  Pain is controlled with current analgesics. Medications being used: ibuprofen (OTC) and narcotic analgesics including Percocet (although notes not having to take much pain medication).  Notes compliance with antibiotic regimen. Patient complains of incision drainage beginning this morning.  Denies fevers/chills, foul smelling drainage or discharge from incision.    Objective:    BP 114/78 mmHg  Pulse 85  Temp(Src) 98.5 F (36.9 C) (Oral)  Wt 174 lb (78.926 kg)  General:  alert and no distress  Abdomen: soft, bowel sounds active, non-tender  Incision:   no dehiscence. Swelling and possible seroma still noted above incision line (unchanged from hospital). Retention sutures in place.  Incision mostly well healed with staples.  Right lateral edge with ~ 1 cm of skin separation, with serosanguinous drainage.  Incision probed with q-tip to allow track for additional drainage; fascia intact. Approximately 30 cc of fluid expressed.      Assessment:    Postoperative course complicated by lateral incision edge with superficial wound separation    Plan:    1. Continue any current medications. 2. Wound care discussed. 3. To continue to dress incision at least daily.  Informed that incision may continue to drain lightly for several days but will then close.  No need to open wound currently.  4. Follow up: 1 week with General Surgery for wound follow up, and in 2 weeks with Dr. Greggory Keen for postpartum visit and wound follow up.    Hildred Laser, MD [facility]

## 2015-02-07 ENCOUNTER — Emergency Department
Admission: EM | Admit: 2015-02-07 | Discharge: 2015-02-07 | Disposition: A | Discharge status: Home / Self Care | Payer: PRIVATE HEALTH INSURANCE | Attending: Emergency Medicine | Admitting: Emergency Medicine

## 2015-02-07 ENCOUNTER — Encounter: Payer: Self-pay | Admitting: *Deleted

## 2015-02-07 ENCOUNTER — Telehealth: Payer: Self-pay | Admitting: Obstetrics and Gynecology

## 2015-02-07 DIAGNOSIS — G8918 Other acute postprocedural pain: Secondary | ICD-10-CM | POA: Insufficient documentation

## 2015-02-07 DIAGNOSIS — R109 Unspecified abdominal pain: Secondary | ICD-10-CM

## 2015-02-07 DIAGNOSIS — R1030 Lower abdominal pain, unspecified: Secondary | ICD-10-CM | POA: Diagnosis present

## 2015-02-07 DIAGNOSIS — Z79899 Other long term (current) drug therapy: Secondary | ICD-10-CM | POA: Diagnosis not present

## 2015-02-07 LAB — CBC
HEMATOCRIT: 33.8 % — AB (ref 35.0–47.0)
Hemoglobin: 11.1 g/dL — ABNORMAL LOW (ref 12.0–16.0)
MCH: 31.8 pg (ref 26.0–34.0)
MCHC: 32.8 g/dL (ref 32.0–36.0)
MCV: 96.9 fL (ref 80.0–100.0)
PLATELETS: 543 10*3/uL — AB (ref 150–440)
RBC: 3.49 MIL/uL — ABNORMAL LOW (ref 3.80–5.20)
RDW: 16 % — AB (ref 11.5–14.5)
WBC: 8.9 10*3/uL (ref 3.6–11.0)

## 2015-02-07 LAB — BASIC METABOLIC PANEL
ANION GAP: 10 (ref 5–15)
BUN: 9 mg/dL (ref 6–20)
CHLORIDE: 103 mmol/L (ref 101–111)
CO2: 27 mmol/L (ref 22–32)
Calcium: 9.3 mg/dL (ref 8.9–10.3)
Creatinine, Ser: 0.5 mg/dL (ref 0.44–1.00)
GFR calc Af Amer: >60 mL/min (ref >60)
GFR calc non Af Amer: >60 mL/min (ref >60)
Glucose, Bld: 119 mg/dL — ABNORMAL HIGH (ref 65–99)
Potassium: 4 mmol/L (ref 3.5–5.1)
Sodium: 140 mmol/L (ref 135–145)

## 2015-02-07 MED ORDER — OXYCODONE-ACETAMINOPHEN 5-325 MG PO TABS: 1.0000 | ORAL_TABLET | Freq: Four times a day (QID) | ORAL | Status: DC | PRN

## 2015-02-07 NOTE — ED Notes (Signed)
C-Section July 11 at [facility], fevers afterwards. Infection inside, surgery redone July 26. Drain in place. Pt reports pain at site when sitting up straight. Feels like a "pinching feeling". Called surgeons office, told to come to ED.

## 2015-02-07 NOTE — Telephone Encounter (Signed)
Patient called stating she is having pain from the rods(patients words) holding her incision open. Please Advise

## 2015-02-07 NOTE — Telephone Encounter (Signed)
Pt was informed to go to ER or call surgeon that assisted Dr. Algis Downs. Dr. Valentino Saxon at hospital and unsure of when she may return.

## 2015-02-07 NOTE — ED Notes (Signed)
Pt states she has had recent abdominal surgery and c-section. Lower abdominal dressing in place along with staples. Pt states pain hurts more when she is sitting up trying use the bathroom.  Pain is like a pinching feeling.  Pt took  Ibuprofen before coming to ED at 1630. Pt currently feels like pain is more cramping and sore right now.

## 2015-02-07 NOTE — Progress Notes (Signed)
I have personally seen and evaluated Cathy Bray.  She presents with request for removal of retention sutures.  Describes them as painful.  On exam, I see no hernia or findings suggesting abscess.  She does have some pressure ulcers in the inferior aspect of the retention sutures which are likely painful.  Have instructed her to put gauze between the skin and the red rubber catheter over the retention sutures.  Have recommended rx for percocet and will see patient in office in 1 week.  Would not remove retention sutures at this time due to wound at high risk of dehiscence.
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
## 2015-02-12 ENCOUNTER — Ambulatory Visit: Payer: No Typology Code available for payment source | Admitting: Surgery

## 2015-02-12 ENCOUNTER — Encounter: Payer: Self-pay | Admitting: Surgery

## 2015-02-12 ENCOUNTER — Ambulatory Visit (INDEPENDENT_AMBULATORY_CARE_PROVIDER_SITE_OTHER): Payer: No Typology Code available for payment source | Admitting: Surgery

## 2015-02-12 VITALS — BP 117/75 | HR 100 | Temp 98.4°F | Ht 63.0 in | Wt 167.0 lb

## 2015-02-12 DIAGNOSIS — Z09 Encounter for follow-up examination after completed treatment for conditions other than malignant neoplasm: Secondary | ICD-10-CM

## 2015-02-12 LAB — FUNGUS CULTURE W SMEAR

## 2015-02-12 MED ORDER — IBUPROFEN 800 MG PO TABS: 800.0000 mg | ORAL_TABLET | Freq: Three times a day (TID) | ORAL | Status: DC | PRN

## 2015-02-12 MED ORDER — DIAZEPAM 2 MG PO TABS: 2.0000 mg | ORAL_TABLET | Freq: Four times a day (QID) | ORAL | Status: DC | PRN

## 2015-02-12 MED ORDER — OXYCODONE-ACETAMINOPHEN 5-325 MG PO TABS: 1.0000 | ORAL_TABLET | Freq: Four times a day (QID) | ORAL | Status: DC | PRN

## 2015-02-12 NOTE — Progress Notes (Signed)
Surgery Progress Note  S: Much better pain control with percocet.  C/o cramps intermittently with incision.  Still with pain from red rubber catheters on retention sutures O:Blood pressure 117/75, pulse 100, temperature 98.4 F (36.9 C), temperature source Oral, height  (1.6 m), weight 167 lb (75.751 kg), unknown if currently breastfeeding. GEN: NAD/A&Ox3 ABD: soft, min tender, nondistended, incision c/d/i with erythema/edema, retention sutures in place with ulcerations at lower aspect  A/P 29 yo s/p ex lap for abdominal abscess, intermediate complexity wound closure - have trimmed red rubber catheters to prevent pressure on lower aspect of wound - every other staple out - rx for pain meds, valium for spasms, prn ibuprofen.

## 2015-02-12 NOTE — Patient Instructions (Signed)
Do not drive on pain medications Do not lift greater than 15 lbs for a period of 6 weeks Call or return to ER if you develop fever greater than 101.5, nausea/vomiting, increased pain, redness/drainage from incisions No tub baths

## 2015-02-15 ENCOUNTER — Ambulatory Visit (INDEPENDENT_AMBULATORY_CARE_PROVIDER_SITE_OTHER): Payer: No Typology Code available for payment source | Admitting: Obstetrics and Gynecology

## 2015-02-15 ENCOUNTER — Encounter: Payer: Self-pay | Admitting: Obstetrics and Gynecology

## 2015-02-15 ENCOUNTER — Ambulatory Visit: Payer: No Typology Code available for payment source | Admitting: Obstetrics and Gynecology

## 2015-02-15 VITALS — BP 109/73 | HR 99 | Ht 63.0 in | Wt 163.8 lb

## 2015-02-15 DIAGNOSIS — K651 Peritoneal abscess: Secondary | ICD-10-CM

## 2015-02-15 DIAGNOSIS — Z9889 Other specified postprocedural states: Secondary | ICD-10-CM | POA: Diagnosis not present

## 2015-02-15 DIAGNOSIS — T8140XA Infection following a procedure, unspecified, initial encounter: Secondary | ICD-10-CM

## 2015-02-15 DIAGNOSIS — IMO0002 Reserved for concepts with insufficient information to code with codable children: Secondary | ICD-10-CM

## 2015-02-15 NOTE — Progress Notes (Signed)
Chief complaint: 1.  Wound check. 2.  Status post emergency cesarean section complicated by abdominal wound infection (GBS). 3.  Status post laparotomy with abscess evacuation and complex wound closure.  Subjective: Patient was to have six-week post partum check today, but this is deferred due to her ongoing convalescence from complex wound closure following laparotomy with abscess evacuation. Her wound closure is being managed By general surgery (Dr. Juliann Pulse). She is tolerating diet and experiencing normal bowel and bladder function.  She is not having any fevers/chills/sweats.  She is nursing.  OBJECTIVE: BP 109/73 mmHg  Pulse 99  Ht  (1.6 m)  Wt 163 lb 12.8 oz (74.299 kg)  BMI 29.02 kg/m2  Breastfeeding? Yes  Patient is a pleasant, well-appearing female in no acute distress. Abdomen soft, nondistended, nontender.  Incision is well approximated with persistence of staples and retention sutures.  Minimal skin ulceration is noted along retention suture line.  No significant drainage.  ASSESSMENT: 1.  Healing complex wound closure.  PLAN: 1.  Continue follow-up with general surgery with probable suture removal in 2 weeks. 2.  Return in 4 weeks for final postpartum check.

## 2015-02-15 NOTE — Patient Instructions (Signed)
1.  Continue follow-up with general surgery for wound care. 2.  Return in 4 weeks for postpartum check.

## 2015-02-19 ENCOUNTER — Telehealth: Payer: Self-pay | Admitting: Surgery

## 2015-02-19 NOTE — Telephone Encounter (Signed)
Returned patient call.. Patient reports that a red bump with drainage has appeared with in the last 2 days. The drainage is coming for the right side of the incision and is green in color. Negative for fever or pain.  Appointment was made for the patient on 02/20/15 at 400 pm  In the [facility] office. Patient confirmed appointment time date and place.

## 2015-02-19 NOTE — Telephone Encounter (Signed)
Bump by incision with watery, greenish coming out

## 2015-02-20 ENCOUNTER — Encounter: Payer: Self-pay | Admitting: Surgery

## 2015-02-20 ENCOUNTER — Encounter (INDEPENDENT_AMBULATORY_CARE_PROVIDER_SITE_OTHER): Payer: Self-pay

## 2015-02-20 ENCOUNTER — Ambulatory Visit (INDEPENDENT_AMBULATORY_CARE_PROVIDER_SITE_OTHER): Payer: No Typology Code available for payment source | Admitting: Surgery

## 2015-02-20 VITALS — BP 134/72 | HR 86 | Temp 98.1°F

## 2015-02-20 DIAGNOSIS — Z09 Encounter for follow-up examination after completed treatment for conditions other than malignant neoplasm: Secondary | ICD-10-CM

## 2015-02-20 NOTE — Patient Instructions (Signed)
Please follow-up in one month with Dr. Juliann Pulse

## 2015-02-20 NOTE — Progress Notes (Signed)
Surgery  1 month s/p closure comples lower transverse incision.  Here for retention suture removal  PE  Wound clean and dry and intact  Retention sutures removed  Staples removed.  Dry dressing applied to left sided pressure ulcers from retention sutures.  F/u in our office 2 weeks.

## 2015-02-28 ENCOUNTER — Ambulatory Visit: Payer: No Typology Code available for payment source | Admitting: Surgery

## 2015-03-01 ENCOUNTER — Telehealth: Payer: Self-pay | Admitting: Obstetrics and Gynecology

## 2015-03-01 ENCOUNTER — Ambulatory Visit (INDEPENDENT_AMBULATORY_CARE_PROVIDER_SITE_OTHER): Payer: Medicaid Other | Admitting: Surgery

## 2015-03-01 ENCOUNTER — Encounter: Payer: Self-pay | Admitting: Surgery

## 2015-03-01 VITALS — BP 118/69 | HR 79 | Temp 98.4°F | Ht 63.0 in | Wt 163.2 lb

## 2015-03-01 DIAGNOSIS — Z09 Encounter for follow-up examination after completed treatment for conditions other than malignant neoplasm: Secondary | ICD-10-CM

## 2015-03-01 MED ORDER — FLUCONAZOLE 150 MG PO TABS: 150.0000 mg | ORAL_TABLET | Freq: Every day | ORAL | Status: DC

## 2015-03-01 NOTE — Telephone Encounter (Signed)
Pt is having cotttage cheese like d/c with an itch. Will erx diflucan. Pt aware if no better in 7 days she will need to be seen.

## 2015-03-01 NOTE — Progress Notes (Signed)
Dr. Juliann Pulse had assisted the obstetrician with reexploration for wound infection. Dr. Egbert Garibaldi had removed the retention sutures last week. Patient is feeling well having no fevers or chills and is eating well and having normal bowel movements.  Physical exam demonstrates healing wound without erythema or drainage no open areas soft nontender abdomen.  Patient doing very well recommend follow up on an as-needed basis reminded of no heavy lifting. She has not had her postpartum appointment yet and can receive her return to work note from her OB.

## 2015-03-01 NOTE — Telephone Encounter (Signed)
Vaginal discharge / itching / thinks it is a yeast/ pt wanted to discuss with yo and see if its ok to use the cream

## 2015-03-01 NOTE — Patient Instructions (Signed)
Call our office with any questions or concerns that you may have.  Make sure that you keep your post-partum appointment with Dr. Greggory Keen.

## 2015-03-07 ENCOUNTER — Ambulatory Visit: Payer: No Typology Code available for payment source | Admitting: Surgery

## 2015-03-15 ENCOUNTER — Encounter: Payer: Self-pay | Admitting: Obstetrics and Gynecology

## 2015-03-15 ENCOUNTER — Ambulatory Visit (INDEPENDENT_AMBULATORY_CARE_PROVIDER_SITE_OTHER): Payer: PRIVATE HEALTH INSURANCE | Admitting: Obstetrics and Gynecology

## 2015-03-15 DIAGNOSIS — N6320 Unspecified lump in the left breast, unspecified quadrant: Secondary | ICD-10-CM

## 2015-03-15 DIAGNOSIS — Z9889 Other specified postprocedural states: Secondary | ICD-10-CM

## 2015-03-15 DIAGNOSIS — Z30011 Encounter for initial prescription of contraceptive pills: Secondary | ICD-10-CM

## 2015-03-15 DIAGNOSIS — N63 Unspecified lump in breast: Secondary | ICD-10-CM

## 2015-03-15 MED ORDER — NORETHINDRONE ACET-ETHINYL EST 1-20 MG-MCG PO TABS: 1.0000 | ORAL_TABLET | Freq: Every day | ORAL | Status: DC

## 2015-03-15 NOTE — Addendum Note (Signed)
Addended by: Jackquline Denmark on: 03/15/2015 02:38 PM   Modules accepted: Orders

## 2015-03-15 NOTE — Patient Instructions (Signed)
1.  Begin Junel 1/20 birth control pills. 2.  Resume all activities as Tolerated. 3.  Ultrasound of left breast nodule ordered. 4.  Return in 6 months for annual exam.
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
## 2015-03-26 ENCOUNTER — Ambulatory Visit: Payer: No Typology Code available for payment source | Admitting: Surgery

## 2015-04-03 ENCOUNTER — Ambulatory Visit: Payer: Self-pay | Admitting: Physician Assistant

## 2015-04-04 ENCOUNTER — Encounter: Payer: Self-pay | Admitting: Physician Assistant

## 2015-04-04 ENCOUNTER — Ambulatory Visit: Payer: Self-pay | Admitting: Physician Assistant

## 2015-04-04 VITALS — BP 110/80 | HR 84 | Temp 98.6°F

## 2015-04-04 DIAGNOSIS — J018 Other acute sinusitis: Secondary | ICD-10-CM

## 2015-04-04 MED ORDER — AZITHROMYCIN 250 MG PO TABS: ORAL_TABLET | ORAL | Status: DC

## 2015-04-04 NOTE — Progress Notes (Signed)
S: C/o runny nose and congestion for 3 days, no fever, chills, cp/sob, v/d; mucus is green and thick, cough is sporadic, c/o of facial and dental pain.   Using otc meds:   O: PE: perrl eomi, normocephalic, tms dull, nasal mucosa red and swollen, throat injected, neck supple no lymph, lungs c t a, cv rrr, neuro intact  A:  Acute sinusitis   P: zpack,  drink fluids, continue regular meds , use otc meds of choice, return if not improving in 5 days, return earlier if worsening

## 2015-04-27 ENCOUNTER — Telehealth: Payer: Self-pay | Admitting: Obstetrics and Gynecology

## 2015-04-27 ENCOUNTER — Other Ambulatory Visit: Payer: Self-pay | Admitting: Obstetrics and Gynecology

## 2015-04-27 DIAGNOSIS — N6320 Unspecified lump in the left breast, unspecified quadrant: Secondary | ICD-10-CM

## 2015-04-27 NOTE — Telephone Encounter (Signed)
NEED ORDERS PACED SO I CAN CALL [facility] AND GET APPT SCHEDULE, LEFT BREAST LUMP, THINK IN THE 9-12 REGION, NOT SURE WHAT CHART SAYS, BUT NEED TO KNOW WHEN MAKING APPT, NEED DX BILATERAL MAMMO WITH LT AND RT BREAST UNILATERAL US ALSO PUT IN.

## 2015-05-01 ENCOUNTER — Other Ambulatory Visit: Payer: Self-pay

## 2015-05-01 ENCOUNTER — Other Ambulatory Visit: Payer: Self-pay | Admitting: Obstetrics and Gynecology

## 2015-05-01 DIAGNOSIS — N6320 Unspecified lump in the left breast, unspecified quadrant: Principal | ICD-10-CM

## 2015-05-01 DIAGNOSIS — N6325 Unspecified lump in the left breast, overlapping quadrants: Secondary | ICD-10-CM

## 2015-05-03 ENCOUNTER — Ambulatory Visit: Payer: PRIVATE HEALTH INSURANCE | Admitting: Obstetrics and Gynecology

## 2015-05-15 ENCOUNTER — Other Ambulatory Visit: Payer: Self-pay

## 2015-05-15 ENCOUNTER — Ambulatory Visit: Payer: Commercial Managed Care - PPO | Attending: Obstetrics and Gynecology

## 2015-05-15 ENCOUNTER — Ambulatory Visit: Payer: Commercial Managed Care - PPO

## 2015-05-17 ENCOUNTER — Encounter: Payer: Self-pay | Admitting: Obstetrics and Gynecology

## 2015-05-17 ENCOUNTER — Ambulatory Visit (INDEPENDENT_AMBULATORY_CARE_PROVIDER_SITE_OTHER): Payer: PRIVATE HEALTH INSURANCE | Admitting: Obstetrics and Gynecology

## 2015-05-17 VITALS — BP 120/76 | HR 103 | Ht 64.0 in | Wt 162.3 lb

## 2015-05-17 DIAGNOSIS — Z30014 Encounter for initial prescription of intrauterine contraceptive device: Secondary | ICD-10-CM | POA: Diagnosis not present

## 2015-05-17 DIAGNOSIS — Z8742 Personal history of other diseases of the female genital tract: Secondary | ICD-10-CM

## 2015-05-17 LAB — POCT URINE PREGNANCY: Preg Test, Ur: NEGATIVE

## 2015-05-17 NOTE — Patient Instructions (Signed)
1. Mirena IUD is inserted. 2. Take Advil as needed for cramps. 3. Return in 4 weeks for string check.

## 2015-05-17 NOTE — Progress Notes (Signed)
Patient ID: [name], female   DOB: 05/18/1986, 29 y.o.   MRN: [id number] Desires paragard insertion No sex x 2 weeks  Chief complaint: 1. Contraceptive management 2. IUD insertion 3. History of dysmenorrhea and menorrhagia  Pros and cons of ParaGard IUD versus Mirena IUD were reviewed. Patient decides to proceed with Mirena IUD insertion at this time.  OBJECTIVE: BP 120/76 mmHg  Pulse 103  Ht 5\' 4"  (1.626 m)  Wt 162 lb 4.8 oz (73.619 kg)  BMI 27.85 kg/m2  Breastfeeding? No IUD Insertion Procedure Note  Pre-operative Diagnosis:  1. Contraception-desires Mirena IUD  Post-operative Diagnosis: same  Indications: contraception  Procedure Details  Urine pregnancy test was done  and result was negative.  The risks (including infection, bleeding, pain, and uterine perforation) and benefits of the procedure were explained to the patient and Written informed consent was obtained.    Cervix cleansed with Betadine. Uterus sounded to 8 cm. IUD inserted without difficulty. String visible and trimmed. Patient tolerated procedure well.  IUD Information: Mirena, Lot # [id number], expiration 2/19.  Condition: Stable  Complications: None  Plan:  The patient was advised to call for any fever or for prolonged or severe pain or bleeding. She was advised to use OTC ibuprofen as needed for mild to moderate pain.   Return to clinic in 4 weeks for IUD string check  Attending Physician Documentation: Herold HarmsMartin A Clovis Mankins, MD

## 2015-05-21 ENCOUNTER — Telehealth: Payer: Self-pay | Admitting: Obstetrics and Gynecology

## 2015-05-21 DIAGNOSIS — N6320 Unspecified lump in the left breast, unspecified quadrant: Secondary | ICD-10-CM

## 2015-05-21 NOTE — Telephone Encounter (Signed)
[facility] said [facility] were cancelled when she called to reschedule. She needs orders for a mammogram.

## 2015-05-21 NOTE — Telephone Encounter (Signed)
Order put in - pt aware per vm.

## 2015-06-07 ENCOUNTER — Ambulatory Visit
Admission: RE | Admit: 2015-06-07 | Discharge: 2015-06-07 | Disposition: A | Discharge status: Home / Self Care | Payer: PRIVATE HEALTH INSURANCE | Source: Ambulatory Visit | Attending: Obstetrics and Gynecology | Admitting: Obstetrics and Gynecology

## 2015-06-07 ENCOUNTER — Other Ambulatory Visit: Payer: Self-pay | Admitting: Obstetrics and Gynecology

## 2015-06-07 ENCOUNTER — Encounter: Payer: Self-pay | Admitting: Physician Assistant

## 2015-06-07 ENCOUNTER — Ambulatory Visit: Payer: Self-pay | Admitting: Physician Assistant

## 2015-06-07 VITALS — BP 120/65 | HR 96 | Temp 98.4°F

## 2015-06-07 DIAGNOSIS — J069 Acute upper respiratory infection, unspecified: Secondary | ICD-10-CM

## 2015-06-07 DIAGNOSIS — N6320 Unspecified lump in the left breast, unspecified quadrant: Secondary | ICD-10-CM

## 2015-06-07 DIAGNOSIS — R928 Other abnormal and inconclusive findings on diagnostic imaging of breast: Secondary | ICD-10-CM | POA: Diagnosis not present

## 2015-06-07 DIAGNOSIS — N63 Unspecified lump in breast: Secondary | ICD-10-CM | POA: Diagnosis present

## 2015-06-07 MED ORDER — FLUTICASONE PROPIONATE 50 MCG/ACT NA SUSP: 2.0000 | Freq: Every day | NASAL | Status: DC

## 2015-06-07 MED ORDER — AZITHROMYCIN 250 MG PO TABS: ORAL_TABLET | ORAL | Status: DC

## 2015-06-07 NOTE — Progress Notes (Signed)
S: C/o runny nose and congestion for 3 days, no fever, chills, cp/sob, v/d; mucus is green and thick, cough is sporadic, c/o of facial and dental pain. Kids have also been sick  Using otc meds:   O: PE: vitals wnl, nad,  perrl eomi, normocephalic, tms dull, nasal mucosa red and swollen, throat injected, neck supple no lymph, lungs c t a, cv rrr, neuro intact  A:  Acute sinusitis   P: zpack, flonase, drink fluids, continue regular meds , use otc meds of choice, return if not improving in 5 days, return earlier if worsening

## 2015-06-14 ENCOUNTER — Encounter: Payer: Self-pay | Admitting: Obstetrics and Gynecology

## 2015-06-14 ENCOUNTER — Ambulatory Visit (INDEPENDENT_AMBULATORY_CARE_PROVIDER_SITE_OTHER): Payer: PRIVATE HEALTH INSURANCE | Admitting: Obstetrics and Gynecology

## 2015-06-14 VITALS — BP 120/80 | HR 91 | Ht 64.0 in | Wt 166.1 lb

## 2015-06-14 DIAGNOSIS — N939 Abnormal uterine and vaginal bleeding, unspecified: Secondary | ICD-10-CM

## 2015-06-14 DIAGNOSIS — Z30431 Encounter for routine checking of intrauterine contraceptive device: Secondary | ICD-10-CM

## 2015-06-14 NOTE — Progress Notes (Signed)
Patient ID: Cathy EhlersJanet Bray, female   DOB: 05/11/1986, 29 y.o.   MRN: [id number]   Chief complaint: 1.  IUD string check. 2.  Abnormal uterine bleeding  4 week sting check Spotting x 1 week Bleeding - 3 weeks- not heavy No vp,vd, No IC since insertion  Past medical history, past surgical history, medications, allergies, and probable lists are reviewed.  OBJECTIVE BP 120/80 mmHg  Pulse 91  Ht 5\' 4"  (1.626 m)  Wt 166 lb 1.6 oz (75.342 kg)  BMI 28.50 kg/m2  LMP  (LMP Unknown)  Breastfeeding? No Pleasant appearing female in no acute distress. Abdomen: Soft, nontender. Pelvic exam: External genitalia normal. BUS-normal. Vagina-minimal blood in vault Cervix-IUD string visualized, 4 cm in length; no cervical motion tenderness; stem of IUD is NOT palpated.Marland Kitchen. Uterus-mmidplane, normal size and shape, nontender. Adnexa-nonpalpable, nontender. Rectovaginal-normal.  External exam  ASSESSMENT 1.  IUD string check, normal. 2.  Abnormal uterine bleeding, likely physiologic, related to IUD; doubt malposition Of IUD at this time.  PLAN: 1.  Menstrual cendar, monitoring 2.  If abnormal bleeding persists into the next month, please call for reassessment and probable ultrasound to confirm IUD placement.  A total of 15 minutes were spent face-to-face with the patient during this encounter and over half of that time dealt with counseling and coordination of care.  Herold HarmsMartin A Grantley Savage, MD  Note: This dictation was prepared with Dragon dictation along with smaller phrase technology. Any transcriptional errors that result from this process are unintentional.

## 2015-06-14 NOTE — Patient Instructions (Signed)
1.  IUD appears to be in normal location with normal strength length. 2.  If abnormal uterine bleeding persists over next month, call back for recheck

## 2015-07-04 ENCOUNTER — Telehealth: Payer: Self-pay | Admitting: Obstetrics and Gynecology

## 2015-07-04 NOTE — Telephone Encounter (Signed)
Pt aware. fmla paper signed and faxed with confirmation.

## 2015-07-04 NOTE — Telephone Encounter (Signed)
Patient returned your call. The date she returned to work was 07/03/15 which needs to be on the paperwork. Thanks

## 2015-07-24 ENCOUNTER — Ambulatory Visit (INDEPENDENT_AMBULATORY_CARE_PROVIDER_SITE_OTHER): Payer: Managed Care, Other (non HMO) | Admitting: Obstetrics and Gynecology

## 2015-07-24 ENCOUNTER — Encounter: Payer: Self-pay | Admitting: Obstetrics and Gynecology

## 2015-07-24 ENCOUNTER — Ambulatory Visit (INDEPENDENT_AMBULATORY_CARE_PROVIDER_SITE_OTHER): Payer: Managed Care, Other (non HMO)

## 2015-07-24 VITALS — BP 122/76 | HR 94 | Ht 64.0 in | Wt 165.9 lb

## 2015-07-24 DIAGNOSIS — N939 Abnormal uterine and vaginal bleeding, unspecified: Secondary | ICD-10-CM | POA: Insufficient documentation

## 2015-07-24 DIAGNOSIS — Z975 Presence of (intrauterine) contraceptive device: Secondary | ICD-10-CM | POA: Diagnosis not present

## 2015-07-24 MED ORDER — NORETHIN ACE-ETH ESTRAD-FE 1-20 MG-MCG(24) PO CAPS: 1.0000 mg | ORAL_CAPSULE | Freq: Every day | ORAL | Status: DC

## 2015-07-24 NOTE — Patient Instructions (Signed)
1.  Take birth control pill as follows: 3 pills a day for 3 days, then 2 pills a day for 2 days, then 1 pill a day until all active pills are taken; then start new pack, one a day. 2.  Ultrasound is scheduled in order to check IUD placement. 3.  Return in 2 months for follow-up

## 2015-07-24 NOTE — Progress Notes (Signed)
Chief complaint: 1.  2 months status post IUD placement. 2.  Abnormal uterine bleeding.  Patient presents for follow-up on abnormal uterine bleeding.  She would like to have suppression of her bleeding.  If this cannot be controlled, she will likely proceed with removal in the near future.  She is requesting oral contraceptives.  Pros and cons of OCP use  Were discussed, including the possibility of abnormal uterine bleeding with the first 3 months of birth control pill use.  She accepts these risks. Patient is willing to proceed with ultrasound to verify IUD placement.  ASSESSMENT: 1.  2 months status post IUD placement. 2.  Abnormal uterine bleeding.  PLAN: 1.  Pelvic ultrasound. 2.  OCP use using "OVRAL Regimen" (3 pills a day 3 days, followed by 2 pills a day for 2 days, followed by daily pills). 3.  Return in 2 months for follow-up.  A total of 15 minutes were spent face-to-face with the patient during this encounter and over half of that time dealt with counseling and coordination of care. Herold Harms, MD  Note: This dictation was prepared with Dragon dictation along with smaller phrase technology. Any transcriptional errors that result from this process are unintentional.

## 2015-07-31 ENCOUNTER — Ambulatory Visit: Payer: Self-pay | Admitting: Physician Assistant

## 2015-07-31 ENCOUNTER — Encounter: Payer: Self-pay | Admitting: Physician Assistant

## 2015-07-31 VITALS — BP 110/70 | HR 74 | Temp 98.8°F

## 2015-07-31 DIAGNOSIS — J018 Other acute sinusitis: Secondary | ICD-10-CM

## 2015-07-31 MED ORDER — PREDNISONE 10 MG PO TABS: 30.0000 mg | ORAL_TABLET | Freq: Every day | ORAL | Status: DC

## 2015-07-31 MED ORDER — AMOXICILLIN-POT CLAVULANATE 875-125 MG PO TABS: 1.0000 | ORAL_TABLET | Freq: Two times a day (BID) | ORAL | Status: DC

## 2015-07-31 MED ORDER — FLUCONAZOLE 150 MG PO TABS: ORAL_TABLET | ORAL | Status: DC

## 2015-07-31 NOTE — Progress Notes (Signed)
S: C/o runny nose and congestion for 3 days, no fever, chills, cp/sob, v/d; mucus is green and thick, cough is sporadic, c/o of facial and dental pain.   Using otc meds:   O: PE: perrl eomi, normocephalic, tms dull, nasal mucosa red and swollen, throat injected, neck supple no lymph, lungs c t a, cv rrr, neuro intact  A:  Acute sinusitis   P:  Augmentin, diflucan, pred  qd x 3d, drink fluids, continue regular meds , use otc meds of choice, return if not improving in 5 days, return earlier if worsening

## 2015-08-15 ENCOUNTER — Ambulatory Visit: Payer: Self-pay | Admitting: Physician Assistant

## 2015-08-15 ENCOUNTER — Encounter: Payer: Self-pay | Admitting: Physician Assistant

## 2015-08-15 VITALS — BP 110/70 | HR 81 | Temp 98.3°F

## 2015-08-15 DIAGNOSIS — J0111 Acute recurrent frontal sinusitis: Secondary | ICD-10-CM

## 2015-08-15 DIAGNOSIS — J018 Other acute sinusitis: Secondary | ICD-10-CM

## 2015-08-15 MED ORDER — CEFDINIR 300 MG PO CAPS: 300.0000 mg | ORAL_CAPSULE | Freq: Two times a day (BID) | ORAL | Status: DC

## 2015-08-15 MED ORDER — PREDNISONE 10 MG PO TABS: 30.0000 mg | ORAL_TABLET | Freq: Every day | ORAL | Status: DC

## 2015-08-15 NOTE — Progress Notes (Signed)
S: C/o runny nose and congestion for 3 days, no fever, chills, cp/sob, v/d; mucus is green and thick,  c/o of facial pain across forehead, same sx about 2 weeks ago, took augmentin but it didn't seem to help,   Using otc meds:   O: ZO:XWRUEA wnl, nad, perrl eomi, normocephalic, tms dull, nasal mucosa red and swollen, throat injected, neck supple no lymph, lungs c t a, cv rrr, neuro intact  A:  Acute recurrent sinusitis   P: omnicef, pred  qd x 3d, use saline nose wash and flonase;  drink fluids, continue regular meds , use otc meds of choice, return if not improving in 5 days, return earlier if worsening

## 2015-09-04 ENCOUNTER — Telehealth: Payer: Self-pay | Admitting: Registered Nurse

## 2015-09-04 ENCOUNTER — Encounter: Payer: Self-pay | Admitting: Registered Nurse

## 2015-09-04 MED ORDER — FLUCONAZOLE 150 MG PO TABS: 150.0000 mg | ORAL_TABLET | Freq: Once | ORAL | Status: DC
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
## 2015-09-19 ENCOUNTER — Encounter: Payer: PRIVATE HEALTH INSURANCE | Admitting: Obstetrics and Gynecology

## 2015-09-20 ENCOUNTER — Ambulatory Visit: Payer: Managed Care, Other (non HMO) | Admitting: Obstetrics and Gynecology

## 2015-09-26 ENCOUNTER — Encounter: Payer: Managed Care, Other (non HMO) | Admitting: Obstetrics and Gynecology

## 2015-10-09 ENCOUNTER — Encounter: Payer: Self-pay | Admitting: Physician Assistant

## 2015-10-09 ENCOUNTER — Ambulatory Visit: Payer: Self-pay | Admitting: Physician Assistant

## 2015-10-09 VITALS — BP 110/75 | HR 111 | Temp 98.8°F

## 2015-10-09 DIAGNOSIS — B349 Viral infection, unspecified: Secondary | ICD-10-CM

## 2015-10-09 LAB — POCT URINALYSIS DIPSTICK
Glucose, UA: NEGATIVE
Leukocytes, UA: NEGATIVE
Nitrite, UA: NEGATIVE
PH UA: 6
SPEC GRAV UA: 1.025
Urobilinogen, UA: 2

## 2015-10-09 LAB — POCT INFLUENZA A/B
INFLUENZA B, POC: NEGATIVE
Influenza A, POC: NEGATIVE

## 2015-10-09 NOTE — Progress Notes (Signed)
S:  Pt c/o nausea and diarrhea, sx for 2 day, no fever/chills, no abd pain except for cramping with diarrhea; denies cp/sob, denies camping, bad food, recent antibiotics, or exposure to bad water Remainder ros neg  O:  Vitals wnl, nad, ENT wnl, neck supple no lymph, lungs c t a, cv rrr, abd soft tender in lower quads b/l;  bs increased lower quads b/l, neuro intact; ua with bili, blood, trace ketones; flu swab neg  A:  Viral gastroenteritis  P: zofran 4mg ; Reassurance, fluids, brat diet, immodium ad for diarrhea if needed, , return if not better in 3 days, return earlier if worsening; discussed abdominal pain and if worsening need to go to ER for imaging

## 2015-10-30 ENCOUNTER — Encounter: Payer: Self-pay | Admitting: Physician Assistant

## 2015-10-30 ENCOUNTER — Ambulatory Visit: Payer: Self-pay | Admitting: Physician Assistant

## 2015-10-30 VITALS — BP 110/60 | HR 98 | Temp 98.9°F

## 2015-10-30 DIAGNOSIS — J302 Other seasonal allergic rhinitis: Secondary | ICD-10-CM

## 2015-10-30 LAB — POCT URINALYSIS DIPSTICK
BILIRUBIN UA: NEGATIVE
Glucose, UA: NEGATIVE
Ketones, UA: NEGATIVE
LEUKOCYTES UA: NEGATIVE
NITRITE UA: NEGATIVE
PH UA: 7
Protein, UA: NEGATIVE
Spec Grav, UA: 1.02
Urobilinogen, UA: 0.2

## 2015-10-30 MED ORDER — PREDNISONE 10 MG PO TABS: 30.0000 mg | ORAL_TABLET | Freq: Every day | ORAL | Status: DC

## 2015-10-30 NOTE — Addendum Note (Signed)
Addended by: Catha BrowEACON, Harnoor Kohles T on: 10/30/2015 03:51 PM   Modules accepted: Orders

## 2015-10-30 NOTE — Progress Notes (Signed)
S: c/o runny nose, congestion, some sinus pressure, sx for about a week, denies fever/chills/body aches, cough, cp/sob, or v/d, mucus is clear  O: vitals wnl, nad, perrl eomi, conjunctiva wnl, tms dull, nasal mucosa swollen and boggy, throat wnl, neck supple no lymph, lungs c t a, cv rrr  A: acute seasonal allergies  P: saline nasal rinse, continue flonase, add prednisone 30mg  qd x 3d, if mucus turns green then will call in an antibiotic

## 2015-11-07 ENCOUNTER — Encounter: Payer: Managed Care, Other (non HMO) | Admitting: Obstetrics and Gynecology

## 2016-01-30 DIAGNOSIS — M25569 Pain in unspecified knee: Secondary | ICD-10-CM | POA: Insufficient documentation

## 2016-03-12 ENCOUNTER — Ambulatory Visit: Payer: Self-pay | Admitting: Physician Assistant

## 2016-03-12 ENCOUNTER — Encounter: Payer: Self-pay | Admitting: Physician Assistant

## 2016-03-12 VITALS — BP 119/70 | HR 96 | Temp 98.6°F

## 2016-03-12 DIAGNOSIS — J0100 Acute maxillary sinusitis, unspecified: Secondary | ICD-10-CM

## 2016-03-12 MED ORDER — AZITHROMYCIN 250 MG PO TABS: ORAL_TABLET | ORAL | 0 refills | Status: DC

## 2016-03-12 MED ORDER — FLUTICASONE PROPIONATE 50 MCG/ACT NA SUSP: 2.0000 | Freq: Every day | NASAL | 6 refills | Status: DC

## 2016-03-12 NOTE — Progress Notes (Signed)
S: C/o runny nose and congestion for 3 days, no fever, chills, cp/sob, v/d; mucus is green and thick, cough is sporadic, c/o of facial and dental pain. Some ear pressure  Using otc meds:   O: PE: vitals wnl, nad, perrl eomi, normocephalic, tms dull, nasal mucosa red and swollen, throat injected, neck supple no lymph, lungs c t a, cv rrr, neuro intact  A:  Acute sinusitis   P: drink fluids, continue regular meds , use otc meds of choice, return if not improving in 5 days, return earlier if worsening , zpack, flonase
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
## 2016-09-16 ENCOUNTER — Ambulatory Visit: Payer: Self-pay | Admitting: Physician Assistant

## 2016-09-16 ENCOUNTER — Encounter: Payer: Self-pay | Admitting: Physician Assistant

## 2016-09-16 VITALS — BP 129/80 | HR 73 | Temp 98.6°F

## 2016-09-16 DIAGNOSIS — J0100 Acute maxillary sinusitis, unspecified: Secondary | ICD-10-CM

## 2016-09-16 MED ORDER — AMOXICILLIN 875 MG PO TABS: 875.0000 mg | ORAL_TABLET | Freq: Two times a day (BID) | ORAL | 0 refills | Status: DC

## 2016-09-16 MED ORDER — PREDNISONE 10 MG PO TABS: 30.0000 mg | ORAL_TABLET | Freq: Every day | ORAL | 0 refills | Status: DC

## 2016-09-16 MED ORDER — FLUCONAZOLE 150 MG PO TABS
150.0000 mg | ORAL_TABLET | Freq: Once | ORAL | 0 refills | Status: AC
Start: 2016-09-16 — End: 2016-09-16

## 2016-09-16 NOTE — Progress Notes (Signed)
S: C/o headache and sinus congestion for 3 days, no fever, chills, cp/sob, v/d; mucus is green and thick, cough is sporadic, c/o of facial and dental pain. Son and husband have both had sinus infections, son was tested for flu and strep and both were negative  Using otc meds:   O: PE: vitals wnl, na,d perrl eomi, normocephalic, tms dull, nasal mucosa red and swollen, throat injected, neck supple no lymph, lungs c t a, cv rrr, neuro intact  A:  Acute sinusitis   P: drink fluids, continue regular meds , use otc meds of choice, return if not improving in 5 days, return earlier if worsening , amoxil, pred 30mg  qd x 3d, diflucan if needed

## 2016-11-12 ENCOUNTER — Encounter: Payer: Self-pay | Admitting: Obstetrics and Gynecology

## 2016-11-12 ENCOUNTER — Ambulatory Visit (INDEPENDENT_AMBULATORY_CARE_PROVIDER_SITE_OTHER): Payer: Managed Care, Other (non HMO) | Admitting: Obstetrics and Gynecology

## 2016-11-12 VITALS — BP 119/77 | HR 90 | Ht 64.0 in | Wt 177.2 lb

## 2016-11-12 DIAGNOSIS — Z01419 Encounter for gynecological examination (general) (routine) without abnormal findings: Secondary | ICD-10-CM

## 2016-11-12 DIAGNOSIS — Z30431 Encounter for routine checking of intrauterine contraceptive device: Secondary | ICD-10-CM | POA: Diagnosis not present

## 2016-11-12 DIAGNOSIS — IMO0001 Reserved for inherently not codable concepts without codable children: Secondary | ICD-10-CM | POA: Insufficient documentation

## 2016-11-12 DIAGNOSIS — R7989 Other specified abnormal findings of blood chemistry: Secondary | ICD-10-CM

## 2016-11-12 DIAGNOSIS — E669 Obesity, unspecified: Secondary | ICD-10-CM

## 2016-11-12 DIAGNOSIS — R945 Abnormal results of liver function studies: Secondary | ICD-10-CM

## 2016-11-12 NOTE — Patient Instructions (Addendum)
1. No Pap smear done. Next Pap is due in 2019 2. Screening labs are ordered 3. Contraception-IUD 4. Continue with healthy eating, exercise, and control weight loss 5. Return in 1 year for annual exam 6. Hepatic profile is ordered due to history of elevated liver enzymes  Health Maintenance, Female Adopting a healthy lifestyle and getting preventive care can go a long way to promote health and wellness. Talk with your health care provider about what schedule of regular examinations is right for you. This is a good chance for you to check in with your provider about disease prevention and staying healthy. In between checkups, there are plenty of things you can do on your own. Experts have done a lot of research about which lifestyle changes and preventive measures are most likely to keep you healthy. Ask your health care provider for more information. Weight and diet Eat a healthy diet  Be sure to include plenty of vegetables, fruits, low-fat dairy products, and lean protein.  Do not eat a lot of foods high in solid fats, added sugars, or salt.  Get regular exercise. This is one of the most important things you can do for your health.  Most adults should exercise for at least 150 minutes each week. The exercise should increase your heart rate and make you sweat (moderate-intensity exercise).  Most adults should also do strengthening exercises at least twice a week. This is in addition to the moderate-intensity exercise. Maintain a healthy weight  Body mass index (BMI) is a measurement that can be used to identify possible weight problems. It estimates body fat based on height and weight. Your health care provider can help determine your BMI and help you achieve or maintain a healthy weight.  For females 57 years of age and older:  A BMI below 18.5 is considered underweight.  A BMI of 18.5 to 24.9 is normal.  A BMI of 25 to 29.9 is considered overweight.  A BMI of 30 and above is  considered obese. Watch levels of cholesterol and blood lipids  You should start having your blood tested for lipids and cholesterol at 33 years of age, then have this test every 5 years.  You may need to have your cholesterol levels checked more often if:  Your lipid or cholesterol levels are high.  You are older than 33 years of age.  You are at high risk for heart disease. Cancer screening Lung Cancer  Lung cancer screening is recommended for adults 85-44 years old who are at high risk for lung cancer because of a history of smoking.  A yearly low-dose CT scan of the lungs is recommended for people who:  Currently smoke.  Have quit within the past 15 years.  Have at least a 30-pack-year history of smoking. A pack year is smoking an average of one pack of cigarettes a day for 1 year.  Yearly screening should continue until it has been 15 years since you quit.  Yearly screening should stop if you develop a health problem that would prevent you from having lung cancer treatment. Breast Cancer  Practice breast self-awareness. This means understanding how your breasts normally appear and feel.  It also means doing regular breast self-exams. Let your health care provider know about any changes, no matter how small.  If you are in your 20s or 30s, you should have a clinical breast exam (CBE) by a health care provider every 1-3 years as part of a regular health exam.  If  you are 40 or older, have a CBE every year. Also consider having a breast X-ray (mammogram) every year.  If you have a family history of breast cancer, talk to your health care provider about genetic screening.  If you are at high risk for breast cancer, talk to your health care provider about having an MRI and a mammogram every year.  Breast cancer gene (BRCA) assessment is recommended for women who have family members with BRCA-related cancers. BRCA-related cancers  include:  Breast.  Ovarian.  Tubal.  Peritoneal cancers.  Results of the assessment will determine the need for genetic counseling and BRCA1 and BRCA2 testing. Cervical Cancer  Your health care provider may recommend that you be screened regularly for cancer of the pelvic organs (ovaries, uterus, and vagina). This screening involves a pelvic examination, including checking for microscopic changes to the surface of your cervix (Pap test). You may be encouraged to have this screening done every 3 years, beginning at age 57.  For women ages 7-65, health care providers may recommend pelvic exams and Pap testing every 3 years, or they may recommend the Pap and pelvic exam, combined with testing for human papilloma virus (HPV), every 5 years. Some types of HPV increase your risk of cervical cancer. Testing for HPV may also be done on women of any age with unclear Pap test results.  Other health care providers may not recommend any screening for nonpregnant women who are considered low risk for pelvic cancer and who do not have symptoms. Ask your health care provider if a screening pelvic exam is right for you.  If you have had past treatment for cervical cancer or a condition that could lead to cancer, you need Pap tests and screening for cancer for at least 20 years after your treatment. If Pap tests have been discontinued, your risk factors (such as having a new sexual partner) need to be reassessed to determine if screening should resume. Some women have medical problems that increase the chance of getting cervical cancer. In these cases, your health care provider may recommend more frequent screening and Pap tests. Colorectal Cancer  This type of cancer can be detected and often prevented.  Routine colorectal cancer screening usually begins at 33 years of age and continues through 33 years of age.  Your health care provider may recommend screening at an earlier age if you have risk factors  for colon cancer.  Your health care provider may also recommend using home test kits to check for hidden blood in the stool.  A small camera at the end of a tube can be used to examine your colon directly (sigmoidoscopy or colonoscopy). This is done to check for the earliest forms of colorectal cancer.  Routine screening usually begins at age 13.  Direct examination of the colon should be repeated every 5-10 years through 33 years of age. However, you may need to be screened more often if early forms of precancerous polyps or small growths are found. Skin Cancer  Check your skin from head to toe regularly.  Tell your health care provider about any new moles or changes in moles, especially if there is a change in a mole's shape or color.  Also tell your health care provider if you have a mole that is larger than the size of a pencil eraser.  Always use sunscreen. Apply sunscreen liberally and repeatedly throughout the day.  Protect yourself by wearing long sleeves, pants, a wide-brimmed hat, and sunglasses whenever you  are outside. Heart disease, diabetes, and high blood pressure  High blood pressure causes heart disease and increases the risk of stroke. High blood pressure is more likely to develop in:  People who have blood pressure in the high end of the normal range (130-139/85-89 mm Hg).  People who are overweight or obese.  People who are African American.  If you are 62-43 years of age, have your blood pressure checked every 3-5 years. If you are 59 years of age or older, have your blood pressure checked every year. You should have your blood pressure measured twice-once when you are at a hospital or clinic, and once when you are not at a hospital or clinic. Record the average of the two measurements. To check your blood pressure when you are not at a hospital or clinic, you can use:  An automated blood pressure machine at a pharmacy.  A home blood pressure monitor.  If you  are between 87 years and 42 years old, ask your health care provider if you should take aspirin to prevent strokes.  Have regular diabetes screenings. This involves taking a blood sample to check your fasting blood sugar level.  If you are at a normal weight and have a low risk for diabetes, have this test once every three years after 33 years of age.  If you are overweight and have a high risk for diabetes, consider being tested at a younger age or more often. Preventing infection Hepatitis B  If you have a higher risk for hepatitis B, you should be screened for this virus. You are considered at high risk for hepatitis B if:  You were born in a country where hepatitis B is common. Ask your health care provider which countries are considered high risk.  Your parents were born in a high-risk country, and you have not been immunized against hepatitis B (hepatitis B vaccine).  You have HIV or AIDS.  You use needles to inject street drugs.  You live with someone who has hepatitis B.  You have had sex with someone who has hepatitis B.  You get hemodialysis treatment.  You take certain medicines for conditions, including cancer, organ transplantation, and autoimmune conditions. Hepatitis C  Blood testing is recommended for:  Everyone born from [birth year] through 1967.  Anyone with known risk factors for hepatitis C. Sexually transmitted infections (STIs)  You should be screened for sexually transmitted infections (STIs) including gonorrhea and chlamydia if:  You are sexually active and are younger than 33 years of age.  You are older than 33 years of age and your health care provider tells you that you are at risk for this type of infection.  Your sexual activity has changed since you were last screened and you are at an increased risk for chlamydia or gonorrhea. Ask your health care provider if you are at risk.  If you do not have HIV, but are at risk, it may be recommended that you  take a prescription medicine daily to prevent HIV infection. This is called pre-exposure prophylaxis (PrEP). You are considered at risk if:  You are sexually active and do not regularly use condoms or know the HIV status of your partner(s).  You take drugs by injection.  You are sexually active with a partner who has HIV. Talk with your health care provider about whether you are at high risk of being infected with HIV. If you choose to begin PrEP, you should first be tested for HIV. You  should then be tested every 3 months for as long as you are taking PrEP. Pregnancy  If you are premenopausal and you may become pregnant, ask your health care provider about preconception counseling.  If you may become pregnant, take 400 to 800 micrograms (mcg) of folic acid every day.  If you want to prevent pregnancy, talk to your health care provider about birth control (contraception). Osteoporosis and menopause  Osteoporosis is a disease in which the bones lose minerals and strength with aging. This can result in serious bone fractures. Your risk for osteoporosis can be identified using a bone density scan.  If you are 78 years of age or older, or if you are at risk for osteoporosis and fractures, ask your health care provider if you should be screened.  Ask your health care provider whether you should take a calcium or vitamin D supplement to lower your risk for osteoporosis.  Menopause may have certain physical symptoms and risks.  Hormone replacement therapy may reduce some of these symptoms and risks. Talk to your health care provider about whether hormone replacement therapy is right for you. Follow these instructions at home:  Schedule regular health, dental, and eye exams.  Stay current with your immunizations.  Do not use any tobacco products including cigarettes, chewing tobacco, or electronic cigarettes.  If you are pregnant, do not drink alcohol.  If you are breastfeeding, limit  how much and how often you drink alcohol.  Limit alcohol intake to no more than 1 drink per day for nonpregnant women. One drink equals 12 ounces of beer, 5 ounces of wine, or 1 ounces of hard liquor.  Do not use street drugs.  Do not share needles.  Ask your health care provider for help if you need support or information about quitting drugs.  Tell your health care provider if you often feel depressed.  Tell your health care provider if you have ever been abused or do not feel safe at home. This information is not intended to replace advice given to you by your health care provider. Make sure you discuss any questions you have with your health care provider. Document Released: 12/30/2010 Document Revised: 11/22/2015 Document Reviewed: 03/20/2015 Elsevier Interactive Patient Education  2017 [employer].

## 2016-11-12 NOTE — Progress Notes (Signed)
ANNUAL PREVENTATIVE CARE GYN  ENCOUNTER NOTE  Subjective:       Cathy Bray is a 31 y.o. [phone] female here for a routine annual gynecologic exam.  Current complaints: 1.  none   Patient is amenorrheic with Mirena IUD. Bowel function is normal Bladder function is normal. Patient has gained several pounds this year. Husband recently diagnosed with stage III colon cancer at age 30.  Gynecologic History No LMP recorded (lmp unknown). Contraception: IUD Last Pap: 08/2014 neg/neg. Results were: normal Last mammogram: 2017. Results were: normal  Obstetric History OB History  Gravida Para Term Preterm AB Living  4 3 2 1 1 3   SAB TAB Ectopic Multiple Live Births  1     0 3    # Outcome Date GA Lbr Len/2nd Weight Sex Delivery Anes PTL Lv  4 Preterm 01/08/15 [redacted]w[redacted]d  2 lb 6.8 oz (1.1 kg) M CS-Classical Spinal  LIV     Birth Comments: preterm  3 SAB 2015          2 Term 2008   7 lb (3.175 kg) M Vag-Spont   LIV  1 Term 2004   7 lb (3.175 kg) M Vag-Spont   LIV      Past Medical History:  Diagnosis Date  . Dysplasia of cervix   . Hematuria   . History of LEEP (loop electrosurgical excision procedure) of cervix complicating pregnancy   . Infertility, female   . Missed ab   . Nausea and vomiting during pregnancy   . Ovarian cyst    LEFT- 2.4CM   . S/P urgent classical cesarean section 01/08/2015   Done for malpresentation (fetal arm in vagina) at [redacted]w[redacted]d s/p PPROM    . Second trimester bleeding     Past Surgical History:  Procedure Laterality Date  . CESAREAN SECTION N/A 01/08/2015   Procedure: CESAREAN SECTION;  Surgeon: Tereso Newcomer, MD;  Location: [facility];  Service: Obstetrics;  Laterality: N/A;  classical on uterus   . LAPAROTOMY N/A 01/23/2015   Procedure: EXPLORATORY LAPAROTOMY;  Surgeon: Herold Harms, MD;  Location: [facility] ORS;  Service: Gynecology;  Laterality: N/A;  . LEEP  2012   [facility]    No current outpatient prescriptions on file prior to visit.   No current  facility-administered medications on file prior to visit.     Allergies  Allergen Reactions  . Shrimp [Shellfish Allergy] Hives    Social History   Social History  . Marital status: Married    Spouse name: N/A  . Number of children: N/A  . Years of education: N/A   Occupational History  . Not on file.   Social History Main Topics  . Smoking status: Never Smoker  . Smokeless tobacco: Never Used  . Alcohol use No  . Drug use: No  . Sexual activity: Yes   Other Topics Concern  . Not on file   Social History Narrative  . No narrative on file    Family History  Problem Relation Age of Onset  . Diabetes Mother   . Heart disease Neg Hx   . Breast cancer Neg Hx   . Colon cancer Neg Hx   . Ovarian cancer Neg Hx     The following portions of the patient's history were reviewed and updated as appropriate: allergies, current medications, past family history, past medical history, past social history, past surgical history and problem list.  Review of Systems ROS Review of Systems - General ROS: negative for -  chills, fatigue, fever, hot flashes, night sweats, weight gain or weight loss Psychological ROS: negative for - anxiety, decreased libido, depression, mood swings, physical abuse or sexual abuse Ophthalmic ROS: negative for - blurry vision, eye pain or loss of vision ENT ROS: negative for - headaches, hearing change, visual changes or vocal changes Allergy and Immunology ROS: negative for - hives, itchy/watery eyes or seasonal allergies Hematological and Lymphatic ROS: negative for - bleeding problems, bruising, swollen lymph nodes or weight loss Endocrine ROS: negative for - galactorrhea, hair pattern changes, hot flashes, malaise/lethargy, mood swings, palpitations, polydipsia/polyuria, skin changes, temperature intolerance or unexpected weight changes Breast ROS: negative for - new or changing breast lumps or nipple discharge Respiratory ROS: negative for - cough or  shortness of breath Cardiovascular ROS: negative for - chest pain, irregular heartbeat, palpitations or shortness of breath Gastrointestinal ROS: no abdominal pain, change in bowel habits, or black or bloody stools Genito-Urinary ROS: no dysuria, trouble voiding, or hematuria Musculoskeletal ROS: negative for - joint pain or joint stiffness Neurological ROS: negative for - bowel and bladder control changes Dermatological ROS: negative for rash and skin lesion changes   Objective:   BP 119/77   Pulse 90   Ht 5\' 4"  (1.626 m)   Wt 177 lb 3.2 oz (80.4 kg)   LMP  (LMP Unknown)   Breastfeeding? No   BMI 30.42 kg/m  CONSTITUTIONAL: Well-developed, well-nourished female in no acute distress.  PSYCHIATRIC: Normal mood and affect. Normal behavior. Normal judgment and thought content. NEUROLGIC: Alert and oriented to person, place, and time. Normal muscle tone coordination. No cranial nerve deficit noted. HENT:  Normocephalic, atraumatic, External right and left ear normal. Oropharynx is clear and moist EYES: Conjunctivae and EOM are normal.  No scleral icterus.  NECK: Normal range of motion, supple, no masses.  Normal thyroid.  SKIN: Skin is warm and dry. No rash noted. Not diaphoretic. No erythema. No pallor. CARDIOVASCULAR: Normal heart rate noted, regular rhythm, no murmur. RESPIRATORY: Clear to auscultation bilaterally. Effort and breath sounds normal, no problems with respiration noted. BREASTS: Symmetric in size. No masses, skin changes, nipple drainage, or lymphadenopathy. ABDOMEN: Soft, normal bowel sounds, no distention noted.  No tenderness, rebound or guarding. Pfannenstiel incision is well-healed BLADDER: Normal PELVIC:  External Genitalia: Normal  BUS: Normal  Vagina: Normal  Cervix: Normal; no cervical motion tenderness  Uterus: Normal; midplane, normal size and shape, mobile, nontender  Adnexa: Normal  RV: External Exam NormaI  MUSCULOSKELETAL: Normal range of motion. No  tenderness.  No cyanosis, clubbing, or edema.  2+ distal pulses. LYMPHATIC: No Axillary, Supraclavicular, or Inguinal Adenopathy.    Assessment:   Annual gynecologic examination 31 y.o. Contraception: IUD bmi-30 History of elevated liver function tests   Plan:  Pap: due 2019 Mammogram: Not Indicated Stool Guaiac Testing:  Not Indicated Labs: lipid fbs a1c lipid Hepatic profile Routine preventative health maintenance measures emphasized: Exercise/Diet/Weight control, Tobacco Warnings and Alcohol/Substance use risks Return to Clinic - 1 Year   Crystal Ocean ParkMiller, CMA  Herold HarmsMartin A Talal Fritchman, MD   Note: This dictation was prepared with Dragon dictation along with smaller phrase technology. Any transcriptional errors that result from this process are unintentional.

## 2016-11-20 ENCOUNTER — Other Ambulatory Visit: Payer: Managed Care, Other (non HMO)

## 2016-11-21 LAB — LIPID PANEL
CHOLESTEROL TOTAL: 156 mg/dL (ref 100–199)
Chol/HDL Ratio: 5.2 ratio — ABNORMAL HIGH (ref 0.0–4.4)
HDL: 30 mg/dL — AB (ref >39)
LDL Calculated: 108 mg/dL — ABNORMAL HIGH (ref 0–99)
TRIGLYCERIDES: 91 mg/dL (ref 0–149)
VLDL Cholesterol Cal: 18 mg/dL (ref 5–40)

## 2016-11-21 LAB — HEPATIC FUNCTION PANEL
ALBUMIN: 4.7 g/dL (ref 3.5–5.5)
ALT: 68 IU/L — AB (ref 0–32)
AST: 30 IU/L (ref 0–40)
Alkaline Phosphatase: 89 IU/L (ref 39–117)
BILIRUBIN TOTAL: 0.8 mg/dL (ref 0.0–1.2)
BILIRUBIN, DIRECT: 0.17 mg/dL (ref 0.00–0.40)
Total Protein: 6.8 g/dL (ref 6.0–8.5)

## 2016-11-21 LAB — TSH: TSH: 2.88 u[IU]/mL (ref 0.450–4.500)

## 2016-11-21 LAB — HEMOGLOBIN A1C
ESTIMATED AVERAGE GLUCOSE: 91 mg/dL
HEMOGLOBIN A1C: 4.8 % (ref 4.8–5.6)

## 2016-11-21 LAB — GLUCOSE, RANDOM: Glucose: 88 mg/dL (ref 65–99)

## 2016-12-18 ENCOUNTER — Ambulatory Visit (INDEPENDENT_AMBULATORY_CARE_PROVIDER_SITE_OTHER): Payer: Managed Care, Other (non HMO) | Admitting: Obstetrics and Gynecology

## 2016-12-18 ENCOUNTER — Encounter: Payer: Self-pay | Admitting: Obstetrics and Gynecology

## 2016-12-18 VITALS — BP 126/85 | HR 82 | Ht 64.0 in | Wt 175.3 lb

## 2016-12-18 DIAGNOSIS — Z30011 Encounter for initial prescription of contraceptive pills: Secondary | ICD-10-CM | POA: Diagnosis not present

## 2016-12-18 DIAGNOSIS — Z30431 Encounter for routine checking of intrauterine contraceptive device: Secondary | ICD-10-CM | POA: Diagnosis not present

## 2016-12-18 DIAGNOSIS — R102 Pelvic and perineal pain: Secondary | ICD-10-CM

## 2016-12-18 NOTE — Patient Instructions (Signed)
1. IUD is removed 2. Begin Ortho Tri-Cyclen Lo oral contraceptive 3. Return for annual exam as scheduled in the fall

## 2016-12-18 NOTE — Progress Notes (Signed)
GYN ENCOUNTER NOTE  Subjective:       Cathy Bray is a 31 y.o. [phone] female is here for gynecologic evaluation of the following issues:   1. IUD removal secondary to painful intercourse, increased cramping and bleeding 2. Start OCP 3. Discuss Tubal Ligation  Patient presents to the office with issues concerning her Mirena IUD. History of IUD in place since 05/17/2015 with no issues or complaints.  Today she reports new onset of painful intercourse, increased cramping and bleeding. She says, "not sure if it has moved, but I would like it removed".  Before having the IUD, she had been on OCP and did fine on them and wishes to restart OCP therapy and schedule tubal ligation procedure for more permanent birth control.  She denies pain at rest, fever, chills or any systemic symptoms.      Gynecologic History Contraception: IUD d/c start OCP Last Pap: 09/12/2014. Results were: normal   Obstetric History OB History  Gravida Para Term Preterm AB Living  4 3 2 1 1 3   SAB TAB Ectopic Multiple Live Births  1     0 3    # Outcome Date GA Lbr Len/2nd Weight Sex Delivery Anes PTL Lv  4 Preterm 01/08/15 [redacted]w[redacted]d  2 lb 6.8 oz (1.1 kg) M CS-Classical Spinal  LIV     Birth Comments: preterm  3 SAB 2015          2 Term 2008   7 lb (3.175 kg) M Vag-Spont   LIV  1 Term 2004   7 lb (3.175 kg) M Vag-Spont   LIV      Past Medical History:  Diagnosis Date  . Dysplasia of cervix   . Hematuria   . History of LEEP (loop electrosurgical excision procedure) of cervix complicating pregnancy   . Infertility, female   . Missed ab   . Nausea and vomiting during pregnancy   . Ovarian cyst    LEFT- 2.4CM   . S/P urgent classical cesarean section 01/08/2015   Done for malpresentation (fetal arm in vagina) at [redacted]w[redacted]d s/p PPROM    . Second trimester bleeding     Past Surgical History:  Procedure Laterality Date  . CESAREAN SECTION N/A 01/08/2015   Procedure: CESAREAN SECTION;  Surgeon: Tereso Newcomer, MD;   Location: [facility];  Service: Obstetrics;  Laterality: N/A;  classical on uterus   . LAPAROTOMY N/A 01/23/2015   Procedure: EXPLORATORY LAPAROTOMY;  Surgeon: Herold Harms, MD;  Location: [facility];  Service: Gynecology;  Laterality: N/A;  . LEEP  2012   [facility]    Current Outpatient Prescriptions on File Prior to Visit  Medication Sig Dispense Refill  . levonorgestrel (MIRENA) 20 MCG/24HR IUD 1 each by Intrauterine route once.     No current facility-administered medications on file prior to visit.     Allergies  Allergen Reactions  . Shrimp [Shellfish Allergy] Hives    Social History   Social History  . Marital status: Married    Spouse name: N/A  . Number of children: N/A  . Years of education: N/A   Occupational History  . Not on file.   Social History Main Topics  . Smoking status: Never Smoker  . Smokeless tobacco: Never Used  . Alcohol use No  . Drug use: No  . Sexual activity: Yes    Birth control/ protection: IUD   Other Topics Concern  . Not on file   Social History Narrative  . No  narrative on file    Family History  Problem Relation Age of Onset  . Diabetes Mother   . Heart disease Neg Hx   . Breast cancer Neg Hx   . Colon cancer Neg Hx   . Ovarian cancer Neg Hx     The following portions of the patient's history were reviewed and updated as appropriate: allergies, current medications, past family history, past medical history, past social history, past surgical history and problem list.  Review of Systems Review of Systems - History obtained from the patient.  See HPI for pertinent ROS  Objective:   BP 126/85   Pulse 82   Ht 5\' 4"  (1.626 m)   Wt 175 lb 4.8 oz (79.5 kg)   LMP  (LMP Unknown)   Breastfeeding? No   BMI 30.09 kg/m  CONSTITUTIONAL: Well-developed, well-nourished female in no acute distress.  HENT:  Normocephalic, atraumatic.  SKIN: Skin is warm and dry. No rash noted. Not diaphoretic. No erythema. No pallor. NEUROLGIC:  Alert and oriented to person, place, and time. PSYCHIATRIC: Normal mood and affect. Normal behavior. Normal judgment and thought content. CARDIOVASCULAR:Not Examined RESPIRATORY: Not Examined BREASTS: Not Examined ABDOMEN: Soft, non distended; Non tender.  No Organomegaly. PELVIC:  External Genitalia: Normal  BUS: Normal  Vagina: Normal  Cervix: Normal- IUD string visualized; the IUD could not be palpated on bimanual exam  Uterus: Mid plane, normal size and shape, mobile and nontender  Adnexa: Normal; nonpalpable nontender  RV: Normal external exam  Bladder: Nontender MUSCULOSKELETAL: Normal range of motion. No tenderness.  No cyanosis, clubbing, or edema.     Assessment:   1. IUD discomfort with increased bleeding and cramping 2. IUD removal successful  3. Contraceptive measures discussed    Plan:   1. Begin OCP for birth control -Ortho Tricyclin Lo 2. Education provided about possibility that IUD was not causing the pain and bleeding and that removal of the IUD may or may not be effective in eliminating her symptoms.    3. Education provided about tubal ligation and the possibility of painful heavy periods returning upon stopping hormonal birth control.  4. Patient voiced understanding. She will follow up at a later date about tubal ligation 5. Will follow up for annual exam 10/2017.   A total of 15 minutes were spent face-to-face with the patient during this encounter and over half of that time dealt with counseling and coordination of care.   Flint MelterKristen Moore, PA-S [facility] [employer] Herold HarmsMartin A Bryleigh Ottaway, MD   I have seen, interviewed, and examined the patient in conjunction with the [facility] student and affirm the diagnosis and management plan. Harold Moncus A. Maite Burlison, MD, FACOG   Note: This dictation was prepared with Dragon dictation along with smaller phrase technology. Any transcriptional errors that result from this process are unintentional.

## 2017-01-12 ENCOUNTER — Ambulatory Visit: Payer: Self-pay | Admitting: Physician Assistant

## 2017-01-12 VITALS — BP 119/79 | HR 97 | Temp 98.5°F | Resp 16

## 2017-01-12 DIAGNOSIS — J3489 Other specified disorders of nose and nasal sinuses: Secondary | ICD-10-CM

## 2017-01-12 NOTE — Progress Notes (Signed)
S: C/o runny nose and congestion for 3 days, no fever, chills, cp/sob, v/d; mucus is clear and thick, c/o of facial and dental pain. Some right ear pressure  Using otc meds:   O: PE: vitals wnl, nad, perrl eomi, normocephalic, tms dull, nasal mucosa pink, throat injected, neck supple no lymph, lungs c t a, cv rrr, neuro intact  A:  Acute sinusitis   P: drink fluids, continue regular meds , use otc meds of choice, return if not improving in 5 days, return earlier if worsening , otc flonase

## 2017-01-15 ENCOUNTER — Ambulatory Visit: Payer: Self-pay | Admitting: Physician Assistant

## 2017-01-15 DIAGNOSIS — R3915 Urgency of urination: Secondary | ICD-10-CM

## 2017-01-15 LAB — POCT URINALYSIS DIPSTICK
Bilirubin, UA: NEGATIVE
Glucose, UA: NEGATIVE
Ketones, UA: NEGATIVE
Leukocytes, UA: NEGATIVE
Nitrite, UA: NEGATIVE
Protein, UA: NEGATIVE
SPEC GRAV UA: 1.02 (ref 1.010–1.025)
UROBILINOGEN UA: 0.2 U/dL
pH, UA: 6 (ref 5.0–8.0)

## 2017-01-15 NOTE — Progress Notes (Signed)
Pt came by office and left urine sample, states she has been having urgency, no fever/chills ua 2+ blood otc meds, F/u with urology for freq utis

## 2017-01-16 NOTE — Progress Notes (Signed)
Patient has been referred to see Dr. Evelene CroonWolff at [facility] per Susan's authorization on 01/20/2017 @ 10:45.  Patient has been notified and has accepted the appointment and clinical notes were sent to the provider's offices.

## 2017-01-20 ENCOUNTER — Other Ambulatory Visit: Payer: Self-pay | Admitting: Urology

## 2017-01-20 DIAGNOSIS — R3129 Other microscopic hematuria: Secondary | ICD-10-CM

## 2017-02-03 ENCOUNTER — Ambulatory Visit: Payer: Managed Care, Other (non HMO)

## 2017-05-25 ENCOUNTER — Encounter: Payer: Self-pay | Admitting: Physician Assistant

## 2017-05-25 ENCOUNTER — Ambulatory Visit: Payer: Self-pay | Admitting: Physician Assistant

## 2017-05-25 VITALS — BP 140/85 | HR 98 | Temp 99.1°F | Resp 16

## 2017-05-25 DIAGNOSIS — J020 Streptococcal pharyngitis: Secondary | ICD-10-CM

## 2017-05-25 LAB — POCT INFLUENZA A/B
INFLUENZA B, POC: NEGATIVE
Influenza A, POC: NEGATIVE

## 2017-05-25 LAB — POCT RAPID STREP A (OFFICE): Rapid Strep A Screen: NEGATIVE

## 2017-05-25 MED ORDER — PSEUDOEPH-BROMPHEN-DM 30-2-10 MG/5ML PO SYRP: 5.0000 mL | ORAL_SOLUTION | Freq: Four times a day (QID) | ORAL | 0 refills | Status: DC | PRN

## 2017-05-25 MED ORDER — IBUPROFEN 800 MG PO TABS: 800.0000 mg | ORAL_TABLET | Freq: Three times a day (TID) | ORAL | 0 refills | Status: DC | PRN

## 2017-05-25 NOTE — Progress Notes (Signed)
   Subjective: Sore throat and chest congestion     Patient ID: Cathy EhlersJanet Bray, female    DOB: 11/17/1985, 31 y.o.   MRN: [id number]  HPI Patient complaining of sore throat and chest congestion for one day. Patient is concerned due to her children currently being treated for strep pharyngitis and exposure to flu. Patient state low-grade fever and chills. Patient denies nausea, vomiting, diarrhea. No palliative measures for complaint.   Review of Systems   negative except for complaints  Objective:   Physical Exam HEENT was remarkable for edematous nasal turbinates and clear rhinorrhea. There is postnasal drainage but the pharynx is not erythematous. Neck is supple without adenopathy. Lungs clear to auscultation heart is regular rate and rhythm. Patient had a negative rapid flu and rapid strep test.       Assessment & Plan: Viral illness   Discussed negative level results with patient. Patient given prescription for Bromfed-DM and ibuprofen. Patient advised to follow with PCP if condition persists.

## 2017-07-02 ENCOUNTER — Ambulatory Visit
Admission: RE | Admit: 2017-07-02 | Discharge: 2017-07-02 | Disposition: A | Discharge status: Home / Self Care | Payer: Managed Care, Other (non HMO) | Source: Ambulatory Visit | Attending: Physician Assistant | Admitting: Physician Assistant

## 2017-07-02 ENCOUNTER — Ambulatory Visit: Payer: Self-pay | Admitting: Physician Assistant

## 2017-07-02 ENCOUNTER — Encounter: Payer: Self-pay | Admitting: Physician Assistant

## 2017-07-02 VITALS — BP 120/70 | HR 82 | Temp 98.2°F | Resp 16

## 2017-07-02 DIAGNOSIS — R197 Diarrhea, unspecified: Secondary | ICD-10-CM

## 2017-07-02 DIAGNOSIS — K760 Fatty (change of) liver, not elsewhere classified: Secondary | ICD-10-CM | POA: Diagnosis not present

## 2017-07-02 DIAGNOSIS — R1031 Right lower quadrant pain: Secondary | ICD-10-CM

## 2017-07-02 LAB — POCT URINALYSIS DIPSTICK
Bilirubin, UA: NEGATIVE
Glucose, UA: NEGATIVE
KETONES UA: NEGATIVE
Leukocytes, UA: NEGATIVE
Nitrite, UA: NEGATIVE
Protein, UA: NEGATIVE
SPEC GRAV UA: 1.025 (ref 1.010–1.025)
Urobilinogen, UA: 0.2 E.U./dL
pH, UA: 6 (ref 5.0–8.0)

## 2017-07-02 LAB — POCT URINE PREGNANCY: Preg Test, Ur: NEGATIVE

## 2017-07-02 MED ORDER — DICYCLOMINE HCL 20 MG PO TABS: 20.0000 mg | ORAL_TABLET | Freq: Four times a day (QID) | ORAL | 0 refills | Status: DC

## 2017-07-02 MED ORDER — IOPAMIDOL (ISOVUE-300) INJECTION 61%
100.0000 mL | Freq: Once | INTRAVENOUS | Status: AC | PRN
  Administered 2017-07-02: 100 mL via INTRAVENOUS

## 2017-07-02 NOTE — Progress Notes (Signed)
   Subjective:abdominal pain    Patient ID: Marigene EhlersJanet Giaimo, female    DOB: 05/12/1986, 32 y.o.   MRN: [id number]  HPI Patient c/o right lower abdominal pain since 29 June 2018.  States nausea, cramping,  and vomiting.  Pain has increased today.  Patient denies urinary complaints. No palliative measure except for Tylenol and Pepto Bismol. Review of Systems   Negative except for complaint.    Objective:   Physical Exam No obvious distention.Normal BS, with moderate guarding with palpation of RLQ. Dip UA unremarkable.      Assessment & Plan:acute abdomen  [facility] Scan of Abdomen.

## 2017-08-11 ENCOUNTER — Ambulatory Visit: Payer: Self-pay | Admitting: Family Medicine

## 2017-08-11 ENCOUNTER — Other Ambulatory Visit: Payer: Self-pay | Admitting: Family Medicine

## 2017-08-11 ENCOUNTER — Encounter: Payer: Self-pay | Admitting: Family Medicine

## 2017-08-11 VITALS — BP 139/89 | HR 100 | Temp 98.3°F | Resp 16

## 2017-08-11 DIAGNOSIS — R202 Paresthesia of skin: Secondary | ICD-10-CM

## 2017-08-11 DIAGNOSIS — R110 Nausea: Secondary | ICD-10-CM

## 2017-08-11 DIAGNOSIS — R200 Anesthesia of skin: Secondary | ICD-10-CM

## 2017-08-11 DIAGNOSIS — R42 Dizziness and giddiness: Secondary | ICD-10-CM

## 2017-08-11 LAB — POCT URINALYSIS DIPSTICK
BILIRUBIN UA: NEGATIVE
Glucose, UA: NEGATIVE
KETONES UA: NEGATIVE
Leukocytes, UA: NEGATIVE
Nitrite, UA: NEGATIVE
PH UA: 6.5 (ref 5.0–8.0)
Protein, UA: NEGATIVE
Spec Grav, UA: <=1.005 — AB (ref 1.010–1.025)
UROBILINOGEN UA: 0.2 U/dL

## 2017-08-11 LAB — POCT URINE PREGNANCY: Preg Test, Ur: NEGATIVE

## 2017-08-11 NOTE — Progress Notes (Signed)
Patient ID: Cathy Bray, female    DOB: 1985/12/29  Age: 32 y.o. MRN: [id number]  Chief Complaint  Patient presents with  . Dizziness  . Nausea  . Blurred Vision  . Tingling    bilat arms    Subjective:   Pleasant lady who is here with history of having developed dizziness, nausea, blurred vision and tingling in her arms since yesterday.  She just does not feel right and does not feel good.  She denies any new illnesses or stresses.  Has a little stuffy nose.  She works a Office manager.  She is married and has 3 children.  She says her husband has just recently returned to work after almost a year since his diagnosis with colorectal cancer, and is now apparently cancer free.  He still has a colostomy.  All of this is been stressful, but she does not feel like in the last few days she has new stresses.  Job stays busy and she has to run back and forth to [place] to various meetings, and it is difficult to manage her children.  No syncopal episodes.  Has not had spells like this in the past.  Her blood pressure at work today was elevated at 132/90 seated and 143/97 when she was standing.  The heart rate was 90 and 96 respectively.  Current allergies, medications, problem list, past/family and social histories reviewed.  Objective:  BP 139/89   Pulse 100   Temp 98.3 F (36.8 C) (Oral)   Resp 16   SpO2 98%   No major acute distress.  Alert and oriented.  TMs normal.  Eyes PRL.  Throat clear.  Neck supple without nodes or thyromegaly.  No carotid bruits.  Chest clear to auscultation.  Heart regular without murmurs.  Cranial nerves II to XII grossly intact.  Sensory grossly normal.  Motor strength is good and symmetrical.  No palmar drift.  Finger to nose good.  Good tandem walk.  She is of normal mentation and does not seem sluggish at all.  Assessment & Plan:   Assessment: 1. Nausea without vomiting   2. Dizziness   3. Numbness and tingling of upper extremity       Plan: See instructions.   Uncertain the cause of things, but almost sounds like some of the flu symptoms minus the fever and cough.  I wonder if she is having a prodrome to a viral illness  Orders Placed This Encounter  Procedures  . POCT Urine Pregnancy (CPT 81025)  . POCT Urinalysis Dipstick (CPT 81002)    No orders of the defined types were placed in this encounter.   Pregnancy test is negative.  Urinalysis is normal except for 2+ blood.  We will check a BMP CBC and TSH NO those results in a few days.  See instructions.     Patient Instructions  I suspect this is some kind of a viral illness triggering the symptoms.    Your blood pressure is higher than it typically is for you, which you usually went running in the 110s or 120s.  Get the blood pressure checked several times in the near future and if it continues to run up near 140/90 or higher return for further assessment.  Drink plenty of fluids and get enough rest.  Stay off work through tomorrow and rest.  Take Tylenol (acetaminophen) 2 pills 3 times daily if needed for headache.  If symptoms get worse please return or go to the emergency room if  necessary.    Return if symptoms worsen or fail to improve.   Alek Borges, MD 08/11/2017

## 2017-08-11 NOTE — Patient Instructions (Addendum)
I suspect this is some kind of a viral illness triggering the symptoms.    Your blood pressure is higher than it typically is for you, which you usually went running in the 110s or 120s.  Get the blood pressure checked several times in the near future and if it continues to run up near 140/90 or higher return for further assessment.  Drink plenty of fluids and get enough rest.  Stay off work through tomorrow and rest.  Take Tylenol (acetaminophen) 2 pills 3 times daily if needed for headache.  If symptoms get worse please return or go to the emergency room if necessary.

## 2017-08-12 LAB — CBC WITH DIFFERENTIAL/PLATELET
BASOS ABS: 0 10*3/uL (ref 0.0–0.2)
Basos: 0 %
EOS (ABSOLUTE): 0.6 10*3/uL — AB (ref 0.0–0.4)
Eos: 8 %
Hematocrit: 44.3 % (ref 34.0–46.6)
Hemoglobin: 15 g/dL (ref 11.1–15.9)
IMMATURE GRANS (ABS): 0 10*3/uL (ref 0.0–0.1)
Immature Granulocytes: 0 %
LYMPHS ABS: 1.7 10*3/uL (ref 0.7–3.1)
LYMPHS: 23 %
MCH: 33.7 pg — AB (ref 26.6–33.0)
MCHC: 33.9 g/dL (ref 31.5–35.7)
MCV: 100 fL — AB (ref 79–97)
MONOS ABS: 0.5 10*3/uL (ref 0.1–0.9)
Monocytes: 7 %
NEUTROS ABS: 4.6 10*3/uL (ref 1.4–7.0)
Neutrophils: 62 %
PLATELETS: 246 10*3/uL (ref 150–379)
RBC: 4.45 x10E6/uL (ref 3.77–5.28)
RDW: 13.4 % (ref 12.3–15.4)
WBC: 7.4 10*3/uL (ref 3.4–10.8)

## 2017-08-12 LAB — BASIC METABOLIC PANEL
BUN / CREAT RATIO: 14 (ref 9–23)
BUN: 10 mg/dL (ref 6–20)
CHLORIDE: 101 mmol/L (ref 96–106)
CO2: 22 mmol/L (ref 20–29)
Calcium: 9.8 mg/dL (ref 8.7–10.2)
Creatinine, Ser: 0.7 mg/dL (ref 0.57–1.00)
GFR calc non Af Amer: 114 mL/min/{1.73_m2} (ref >59)
GFR, EST AFRICAN AMERICAN: 132 mL/min/{1.73_m2} (ref >59)
GLUCOSE: 77 mg/dL (ref 65–99)
POTASSIUM: 4.1 mmol/L (ref 3.5–5.2)
SODIUM: 142 mmol/L (ref 134–144)

## 2017-08-12 LAB — TSH: TSH: 3.17 u[IU]/mL (ref 0.450–4.500)

## 2017-09-22 NOTE — Progress Notes (Signed)
This patient apparently never had the TSH and BMP.  Please contact her to be sure it was done.  I have no more shifts at that practice.

## 2017-11-09 NOTE — Progress Notes (Signed)
ANNUAL PREVENTATIVE CARE GYN  ENCOUNTER NOTE  Subjective:       Cathy Bray is a 32 y.o. [phone] female here for a routine annual gynecologic exam.  Current complaints: 1.  No cycle since 02/2017; patient had Mirena IUD in until September 2018; following IUD removal, one menses occurred, followed by no subsequent periods.  She reports no vasomotor symptoms, no vaginal dryness, no galactorrhea, no change in her growth; she has had a 13 pound weight loss over the past several months.  Bowel function is normal Bladder function is normal.  Husband recently diagnosed with stage III colon cancer at age 49; he is status post colectomy and removal of rectum, now with permanent colostomy; he has erectile dysfunction and is self-conscious regarding needing the use of Viagra and obtaining a prescription. Patient reports significant stress having 3 children, a sick husband, work Catering manager. overall she is coping very well.  Gynecologic History lmp-03/03/2017 Contraception: none Last Pap: 08/2014 neg/neg. Results were: normal Last mammogram: 05/2015 birad 1. Results were: normal  Obstetric History OB History  Gravida Para Term Preterm AB Living  SAB TAB Ectopic Multiple Live Births  1     0 3    # Outcome Date GA Lbr Len/2nd Weight Sex Delivery Anes PTL Lv  4 Preterm 01/08/15 [redacted]w[redacted]d  2 lb 6.8 oz (1.1 kg) M CS-Classical Spinal  LIV     Birth Comments: preterm  3 SAB 2015          2 Term 2008   7 lb (3.175 kg) M Vag-Spont   LIV  1 Term 2004   7 lb (3.175 kg) M Vag-Spont   LIV    Past Medical History:  Diagnosis Date  . Dysplasia of cervix   . Hematuria   . History of LEEP (loop electrosurgical excision procedure) of cervix complicating pregnancy   . Infertility, female   . Missed ab   . Nausea and vomiting during pregnancy   . Ovarian cyst    LEFT- 2.4CM   . S/P urgent classical cesarean section 01/08/2015   Done for malpresentation (fetal arm in vagina) at [redacted]w[redacted]d s/p PPROM    . Second  trimester bleeding     Past Surgical History:  Procedure Laterality Date  . CESAREAN SECTION N/A 01/08/2015   Procedure: CESAREAN SECTION;  Surgeon: Tereso Newcomer, MD;  Location: [facility];  Service: Obstetrics;  Laterality: N/A;  classical on uterus   . LAPAROTOMY N/A 01/23/2015   Procedure: EXPLORATORY LAPAROTOMY;  Surgeon: Herold Harms, MD;  Location: [facility] ORS;  Service: Gynecology;  Laterality: N/A;  . LEEP  2012   [facility]    Current Outpatient Medications on File Prior to Visit  Medication Sig Dispense Refill  . dicyclomine (BENTYL) 20 MG tablet Take 1 tablet (20 mg total) by mouth every 6 (six) hours. (Patient not taking: Reported on 08/11/2017) 40 tablet 0  . ibuprofen (ADVIL,MOTRIN) 800 MG tablet Take 1 tablet (800 mg total) by mouth every 8 (eight) hours as needed for moderate pain. (Patient not taking: Reported on 08/11/2017) 15 tablet 0   No current facility-administered medications on file prior to visit.     Allergies  Allergen Reactions  . Shrimp [Shellfish Allergy] Hives    Social History   Socioeconomic History  . Marital status: Married    Spouse name: Not on file  . Number of children: Not on file  . Years of education: Not on file  .  Highest education level: Not on file  Occupational History  . Not on file  Social Needs  . Financial resource strain: Not on file  . Food insecurity:    Worry: Not on file    Inability: Not on file  . Transportation needs:    Medical: Not on file    Non-medical: Not on file  Tobacco Use  . Smoking status: Never Smoker  . Smokeless tobacco: Never Used  Substance and Sexual Activity  . Alcohol use: No  . Drug use: No  . Sexual activity: Yes    Birth control/protection: IUD  Lifestyle  . Physical activity:    Days per week: Not on file    Minutes per session: Not on file  . Stress: Not on file  Relationships  . Social connections:    Talks on phone: Not on file    Gets together: Not on file    Attends religious  service: Not on file    Active member of club or organization: Not on file    Attends meetings of clubs or organizations: Not on file    Relationship status: Not on file  . Intimate partner violence:    Fear of current or ex partner: Not on file    Emotionally abused: Not on file    Physically abused: Not on file    Forced sexual activity: Not on file  Other Topics Concern  . Not on file  Social History Narrative  . Not on file    Family History  Problem Relation Age of Onset  . Diabetes Mother   . Heart disease Neg Hx   . Breast cancer Neg Hx   . Colon cancer Neg Hx   . Ovarian cancer Neg Hx     The following portions of the patient's history were reviewed and updated as appropriate: allergies, current medications, past family history, past medical history, past social history, past surgical history and problem list.  Review of Systems Review of Systems  Constitutional:       No vasomotor symptoms  HENT: Negative.   Eyes: Negative.   Respiratory: Negative.   Cardiovascular: Negative.   Gastrointestinal: Negative.   Genitourinary: Negative.        No galactorrhea  Musculoskeletal: Negative.   Skin: Negative.        No increased hair growth    Neurological: Negative.  Negative for headaches.  Endo/Heme/Allergies: Negative.   Psychiatric/Behavioral: Negative.       Objective:   BP 116/76   Pulse 80   Ht  (1.626 m)   Wt 163 lb 12.8 oz (74.3 kg)   LMP 03/03/2017   BMI 28.12 kg/m  CONSTITUTIONAL: Well-developed, well-nourished female in no acute distress.  PSYCHIATRIC: Normal mood and affect. Normal behavior. Normal judgment and thought content.  No obvious anxiety symptoms NEUROLGIC: Alert and oriented to person, place, and time. Normal muscle tone coordination. No cranial nerve deficit noted. HENT:  Normocephalic, atraumatic, External right and left ear normal.  EYES: Conjunctivae and EOM are normal.  No scleral icterus.  NECK: Normal range of motion,  supple, no masses.  Normal thyroid.  SKIN: Skin is warm and dry. No rash noted. Not diaphoretic. No erythema. No pallor. CARDIOVASCULAR: Normal heart rate noted, regular rhythm, no murmur. RESPIRATORY: Clear to auscultation bilaterally. Effort and breath sounds normal, no problems with respiration noted. BREASTS: Symmetric in size. No masses, skin changes, nipple drainage, or lymphadenopathy. ABDOMEN: Soft, normal bowel sounds, no distention noted.  No  tenderness, rebound or guarding. Pfannenstiel incision is well-healed BLADDER: Normal PELVIC:  External Genitalia: Normal  BUS: Normal  Vagina: Normal; good estrogen effect  Cervix: Normal; no cervical motion tenderness, eversion present  Uterus: Normal; midplane, normal size and shape, mobile, nontender  Adnexa: Normal; nonpalpable nontender  RV: External Exam NormaI  MUSCULOSKELETAL: Normal range of motion. No tenderness.  No cyanosis, clubbing, or edema.  2+ distal pulses. LYMPHATIC: No Axillary, Supraclavicular, or Inguinal Adenopathy.    Assessment:   Annual gynecologic examination 32 y.o. Contraception: none [id number] History of elevated liver function tests Amenorrhea since removal of Mirena IUD in September 2018; no evidence of premature menopause, galactorrhea, or hirsutism.  No significant weight change Increased life stressors without obvious anxiety/depression   Plan:  Pap: pap/hpv Mammogram: Not Indicated Stool Guaiac Testing:  Not Indicated Labs: lipid fbs a1c lipid Hepatic profile prolactin level FSH TSH Provera 10 mg a day for 10 days Menstrual calendar monitoring Follow-up in 3 months regarding amenorrhea Routine preventative health maintenance measures emphasized: Exercise/Diet/Weight control, Tobacco Warnings and Alcohol/Substance use risks Return to Clinic - 1 Year   Crystal Leroy, CMA  Herold Harms, MD  Note: This dictation was prepared with Dragon dictation along with smaller phrase technology.  Any transcriptional errors that result from this process are unintentional.

## 2017-11-12 ENCOUNTER — Encounter: Payer: Self-pay | Admitting: Obstetrics and Gynecology

## 2017-11-12 ENCOUNTER — Ambulatory Visit (INDEPENDENT_AMBULATORY_CARE_PROVIDER_SITE_OTHER): Payer: Managed Care, Other (non HMO) | Admitting: Obstetrics and Gynecology

## 2017-11-12 VITALS — BP 116/76 | HR 80 | Ht 64.0 in | Wt 163.8 lb

## 2017-11-12 DIAGNOSIS — E669 Obesity, unspecified: Secondary | ICD-10-CM

## 2017-11-12 DIAGNOSIS — Z01419 Encounter for gynecological examination (general) (routine) without abnormal findings: Secondary | ICD-10-CM | POA: Diagnosis not present

## 2017-11-12 DIAGNOSIS — Z30431 Encounter for routine checking of intrauterine contraceptive device: Secondary | ICD-10-CM

## 2017-11-12 DIAGNOSIS — N912 Amenorrhea, unspecified: Secondary | ICD-10-CM | POA: Diagnosis not present

## 2017-11-12 LAB — POCT URINE PREGNANCY: Preg Test, Ur: NEGATIVE

## 2017-11-12 MED ORDER — MEDROXYPROGESTERONE ACETATE 10 MG PO TABS: 10.0000 mg | ORAL_TABLET | Freq: Every day | ORAL | 0 refills | Status: DC

## 2017-11-12 NOTE — Patient Instructions (Signed)
1.  Pap smear is done. 2.  Self breast awareness is encouraged 3.  Screening labs are obtained.  In addition hepatic function panel is obtained along with FSH and prolactin level 4.  Continue with healthy eating and exercise 5.  Contraception-none 6.  Return in 3 months for follow-up on amenorrhea status 7.  Provera 10 mg a day for 10 days is given to help because of withdrawal bleed 8.  Maintain menstrual calendar monitoring 9.  Return in 1 year for annual exam  Health Maintenance, Female Adopting a healthy lifestyle and getting preventive care can go a long way to promote health and wellness. Talk with your health care provider about what schedule of regular examinations is right for you. This is a good chance for you to check in with your provider about disease prevention and staying healthy. In between checkups, there are plenty of things you can do on your own. Experts have done a lot of research about which lifestyle changes and preventive measures are most likely to keep you healthy. Ask your health care provider for more information. Weight and diet Eat a healthy diet  Be sure to include plenty of vegetables, fruits, low-fat dairy products, and lean protein.  Do not eat a lot of foods high in solid fats, added sugars, or salt.  Get regular exercise. This is one of the most important things you can do for your health. ? Most adults should exercise for at least 150 minutes each week. The exercise should increase your heart rate and make you sweat (moderate-intensity exercise). ? Most adults should also do strengthening exercises at least twice a week. This is in addition to the moderate-intensity exercise.  Maintain a healthy weight  Body mass index (BMI) is a measurement that can be used to identify possible weight problems. It estimates body fat based on height and weight. Your health care provider can help determine your BMI and help you achieve or maintain a healthy weight.  For  females 53 years of age and older: ? A BMI below 18.5 is considered underweight. ? A BMI of 18.5 to 24.9 is normal. ? A BMI of 25 to 29.9 is considered overweight. ? A BMI of 30 and above is considered obese.  Watch levels of cholesterol and blood lipids  You should start having your blood tested for lipids and cholesterol at 34 years of age, then have this test every 5 years.  You may need to have your cholesterol levels checked more often if: ? Your lipid or cholesterol levels are high. ? You are older than 34 years of age. ? You are at high risk for heart disease.  Cancer screening Lung Cancer  Lung cancer screening is recommended for adults 11-83 years old who are at high risk for lung cancer because of a history of smoking.  A yearly low-dose CT scan of the lungs is recommended for people who: ? Currently smoke. ? Have quit within the past 15 years. ? Have at least a 30-pack-year history of smoking. A pack year is smoking an average of one pack of cigarettes a day for 1 year.  Yearly screening should continue until it has been 15 years since you quit.  Yearly screening should stop if you develop a health problem that would prevent you from having lung cancer treatment.  Breast Cancer  Practice breast self-awareness. This means understanding how your breasts normally appear and feel.  It also means doing regular breast self-exams. Let your health  care provider know about any changes, no matter how small.  If you are in your 20s or 30s, you should have a clinical breast exam (CBE) by a health care provider every 1-3 years as part of a regular health exam.  If you are 37 or older, have a CBE every year. Also consider having a breast X-ray (mammogram) every year.  If you have a family history of breast cancer, talk to your health care provider about genetic screening.  If you are at high risk for breast cancer, talk to your health care provider about having an MRI and a  mammogram every year.  Breast cancer gene (BRCA) assessment is recommended for women who have family members with BRCA-related cancers. BRCA-related cancers include: ? Breast. ? Ovarian. ? Tubal. ? Peritoneal cancers.  Results of the assessment will determine the need for genetic counseling and BRCA1 and BRCA2 testing.  Cervical Cancer Your health care provider may recommend that you be screened regularly for cancer of the pelvic organs (ovaries, uterus, and vagina). This screening involves a pelvic examination, including checking for microscopic changes to the surface of your cervix (Pap test). You may be encouraged to have this screening done every 3 years, beginning at age 72.  For women ages 76-65, health care providers may recommend pelvic exams and Pap testing every 3 years, or they may recommend the Pap and pelvic exam, combined with testing for human papilloma virus (HPV), every 5 years. Some types of HPV increase your risk of cervical cancer. Testing for HPV may also be done on women of any age with unclear Pap test results.  Other health care providers may not recommend any screening for nonpregnant women who are considered low risk for pelvic cancer and who do not have symptoms. Ask your health care provider if a screening pelvic exam is right for you.  If you have had past treatment for cervical cancer or a condition that could lead to cancer, you need Pap tests and screening for cancer for at least 20 years after your treatment. If Pap tests have been discontinued, your risk factors (such as having a new sexual partner) need to be reassessed to determine if screening should resume. Some women have medical problems that increase the chance of getting cervical cancer. In these cases, your health care provider may recommend more frequent screening and Pap tests.  Colorectal Cancer  This type of cancer can be detected and often prevented.  Routine colorectal cancer screening usually  begins at 34 years of age and continues through 34 years of age.  Your health care provider may recommend screening at an earlier age if you have risk factors for colon cancer.  Your health care provider may also recommend using home test kits to check for hidden blood in the stool.  A small camera at the end of a tube can be used to examine your colon directly (sigmoidoscopy or colonoscopy). This is done to check for the earliest forms of colorectal cancer.  Routine screening usually begins at age 67.  Direct examination of the colon should be repeated every 5-10 years through 34 years of age. However, you may need to be screened more often if early forms of precancerous polyps or small growths are found.  Skin Cancer  Check your skin from head to toe regularly.  Tell your health care provider about any new moles or changes in moles, especially if there is a change in a mole's shape or color.  Also tell  your health care provider if you have a mole that is larger than the size of a pencil eraser.  Always use sunscreen. Apply sunscreen liberally and repeatedly throughout the day.  Protect yourself by wearing long sleeves, pants, a wide-brimmed hat, and sunglasses whenever you are outside.  Heart disease, diabetes, and high blood pressure  High blood pressure causes heart disease and increases the risk of stroke. High blood pressure is more likely to develop in: ? People who have blood pressure in the high end of the normal range (130-139/85-89 mm Hg). ? People who are overweight or obese. ? People who are African American.  If you are 69-47 years of age, have your blood pressure checked every 3-5 years. If you are 76 years of age or older, have your blood pressure checked every year. You should have your blood pressure measured twice-once when you are at a hospital or clinic, and once when you are not at a hospital or clinic. Record the average of the two measurements. To check your  blood pressure when you are not at a hospital or clinic, you can use: ? An automated blood pressure machine at a pharmacy. ? A home blood pressure monitor.  If you are between 13 years and 69 years old, ask your health care provider if you should take aspirin to prevent strokes.  Have regular diabetes screenings. This involves taking a blood sample to check your fasting blood sugar level. ? If you are at a normal weight and have a low risk for diabetes, have this test once every three years after 34 years of age. ? If you are overweight and have a high risk for diabetes, consider being tested at a younger age or more often. Preventing infection Hepatitis B  If you have a higher risk for hepatitis B, you should be screened for this virus. You are considered at high risk for hepatitis B if: ? You were born in a country where hepatitis B is common. Ask your health care provider which countries are considered high risk. ? Your parents were born in a high-risk country, and you have not been immunized against hepatitis B (hepatitis B vaccine). ? You have HIV or AIDS. ? You use needles to inject street drugs. ? You live with someone who has hepatitis B. ? You have had sex with someone who has hepatitis B. ? You get hemodialysis treatment. ? You take certain medicines for conditions, including cancer, organ transplantation, and autoimmune conditions.  Hepatitis C  Blood testing is recommended for: ? Everyone born from [birth year] through 1967. ? Anyone with known risk factors for hepatitis C.  Sexually transmitted infections (STIs)  You should be screened for sexually transmitted infections (STIs) including gonorrhea and chlamydia if: ? You are sexually active and are younger than 34 years of age. ? You are older than 34 years of age and your health care provider tells you that you are at risk for this type of infection. ? Your sexual activity has changed since you were last screened and you are at  an increased risk for chlamydia or gonorrhea. Ask your health care provider if you are at risk.  If you do not have HIV, but are at risk, it may be recommended that you take a prescription medicine daily to prevent HIV infection. This is called pre-exposure prophylaxis (PrEP). You are considered at risk if: ? You are sexually active and do not regularly use condoms or know the HIV status of your partner(s). ?  You take drugs by injection. ? You are sexually active with a partner who has HIV.  Talk with your health care provider about whether you are at high risk of being infected with HIV. If you choose to begin PrEP, you should first be tested for HIV. You should then be tested every 3 months for as long as you are taking PrEP. Pregnancy  If you are premenopausal and you may become pregnant, ask your health care provider about preconception counseling.  If you may become pregnant, take 400 to 800 micrograms (mcg) of folic acid every day.  If you want to prevent pregnancy, talk to your health care provider about birth control (contraception). Osteoporosis and menopause  Osteoporosis is a disease in which the bones lose minerals and strength with aging. This can result in serious bone fractures. Your risk for osteoporosis can be identified using a bone density scan.  If you are 11 years of age or older, or if you are at risk for osteoporosis and fractures, ask your health care provider if you should be screened.  Ask your health care provider whether you should take a calcium or vitamin D supplement to lower your risk for osteoporosis.  Menopause may have certain physical symptoms and risks.  Hormone replacement therapy may reduce some of these symptoms and risks. Talk to your health care provider about whether hormone replacement therapy is right for you. Follow these instructions at home:  Schedule regular health, dental, and eye exams.  Stay current with your immunizations.  Do not  use any tobacco products including cigarettes, chewing tobacco, or electronic cigarettes.  If you are pregnant, do not drink alcohol.  If you are breastfeeding, limit how much and how often you drink alcohol.  Limit alcohol intake to no more than 1 drink per day for nonpregnant women. One drink equals 12 ounces of beer, 5 ounces of wine, or 1 ounces of hard liquor.  Do not use street drugs.  Do not share needles.  Ask your health care provider for help if you need support or information about quitting drugs.  Tell your health care provider if you often feel depressed.  Tell your health care provider if you have ever been abused or do not feel safe at home. This information is not intended to replace advice given to you by your health care provider. Make sure you discuss any questions you have with your health care provider. Document Released: 12/30/2010 Document Revised: 11/22/2015 Document Reviewed: 03/20/2015 Elsevier Interactive Patient Education  [employer].

## 2017-11-13 LAB — HEPATIC FUNCTION PANEL
ALK PHOS: 95 IU/L (ref 39–117)
ALT: 92 IU/L — ABNORMAL HIGH (ref 0–32)
AST: 35 IU/L (ref 0–40)
Albumin: 4.7 g/dL (ref 3.5–5.5)
Bilirubin Total: 0.9 mg/dL (ref 0.0–1.2)
Bilirubin, Direct: 0.21 mg/dL (ref 0.00–0.40)
TOTAL PROTEIN: 6.8 g/dL (ref 6.0–8.5)

## 2017-11-13 LAB — LIPID PANEL
Chol/HDL Ratio: 4 ratio (ref 0.0–4.4)
Cholesterol, Total: 145 mg/dL (ref 100–199)
HDL: 36 mg/dL — ABNORMAL LOW (ref >39)
LDL CALC: 93 mg/dL (ref 0–99)
Triglycerides: 80 mg/dL (ref 0–149)
VLDL CHOLESTEROL CAL: 16 mg/dL (ref 5–40)

## 2017-11-13 LAB — HEMOGLOBIN A1C
Est. average glucose Bld gHb Est-mCnc: 85 mg/dL
Hgb A1c MFr Bld: 4.6 % — ABNORMAL LOW (ref 4.8–5.6)

## 2017-11-13 LAB — GLUCOSE, RANDOM: Glucose: 79 mg/dL (ref 65–99)

## 2017-11-13 LAB — FOLLICLE STIMULATING HORMONE: FSH: 6.5 m[IU]/mL

## 2017-11-13 LAB — PROLACTIN: PROLACTIN: 7.2 ng/mL (ref 4.8–23.3)

## 2017-11-13 LAB — TSH: TSH: 1.88 u[IU]/mL (ref 0.450–4.500)

## 2017-11-25 LAB — IGP, COBASHPV16/18
HPV 16: NEGATIVE
HPV 18: NEGATIVE
HPV other hr types: NEGATIVE
PAP Smear Comment: 0

## 2018-02-16 ENCOUNTER — Encounter: Payer: Managed Care, Other (non HMO) | Admitting: Obstetrics and Gynecology

## 2018-04-02 ENCOUNTER — Ambulatory Visit: Payer: Self-pay | Admitting: Emergency Medicine

## 2018-04-02 VITALS — BP 118/76 | HR 100 | Temp 99.2°F | Resp 12 | Ht 64.0 in | Wt 173.0 lb

## 2018-04-02 DIAGNOSIS — N3001 Acute cystitis with hematuria: Secondary | ICD-10-CM

## 2018-04-02 DIAGNOSIS — R300 Dysuria: Secondary | ICD-10-CM

## 2018-04-02 LAB — POCT URINALYSIS DIPSTICK
Bilirubin, UA: NEGATIVE
Glucose, UA: NEGATIVE
Ketones, UA: NEGATIVE
Protein, UA: POSITIVE — AB
Spec Grav, UA: 1.025 (ref 1.010–1.025)
Urobilinogen, UA: 1 E.U./dL
pH, UA: 5.5 (ref 5.0–8.0)

## 2018-04-02 MED ORDER — CEPHALEXIN 250 MG PO CAPS: 250.0000 mg | ORAL_CAPSULE | Freq: Three times a day (TID) | ORAL | 0 refills | Status: DC

## 2018-04-02 NOTE — Patient Instructions (Addendum)
All  Urinary Tract Infection, Adult A urinary tract infection (UTI) is an infection of any part of the urinary tract, which includes the kidneys, ureters, bladder, and urethra. These organs make, store, and get rid of urine in the body. UTI can be a bladder infection (cystitis) or kidney infection (pyelonephritis). What are the causes? This infection may be caused by fungi, viruses, or bacteria. Bacteria are the most common cause of UTIs. This condition can also be caused by repeated incomplete emptying of the bladder during urination. What increases the risk? This condition is more likely to develop if:  You ignore your need to urinate or hold urine for long periods of time.  You do not empty your bladder completely during urination.  You wipe back to front after urinating or having a bowel movement, if you are female.  You are uncircumcised, if you are female.  You are constipated.  You have a urinary catheter that stays in place (indwelling).  You have a weak defense (immune) system.  You have a medical condition that affects your bowels, kidneys, or bladder.  You have diabetes.  You take antibiotic medicines frequently or for long periods of time, and the antibiotics no longer work well against certain types of infections (antibiotic resistance).  You take medicines that irritate your urinary tract.  You are exposed to chemicals that irritate your urinary tract.  You are female.  What are the signs or symptoms? Symptoms of this condition include:  Fever.  Frequent urination or passing small amounts of urine frequently.  Needing to urinate urgently.  Pain or burning with urination.  Urine that smells bad or unusual.  Cloudy urine.  Pain in the lower abdomen or back.  Trouble urinating.  Blood in the urine.  Vomiting or being less hungry than normal.  Diarrhea or abdominal pain.  Vaginal discharge, if you are female.  How is this diagnosed? This  condition is diagnosed with a medical history and physical exam. You will also need to provide a urine sample to test your urine. Other tests may be done, including:  Blood tests.  Sexually transmitted disease (STD) testing.  If you have had more than one UTI, a cystoscopy or imaging studies may be done to determine the cause of the infections. How is this treated? Treatment for this condition often includes a combination of two or more of the following:  Antibiotic medicine.  Other medicines to treat less common causes of UTI.  Over-the-counter medicines to treat pain.  Drinking enough water to stay hydrated.  Follow these instructions at home:  Take over-the-counter and prescription medicines only as told by your health care provider.  If you were prescribed an antibiotic, take it as told by your health care provider. Do not stop taking the antibiotic even if you start to feel better.  Avoid alcohol, caffeine, tea, and carbonated beverages. They can irritate your bladder.  Drink enough fluid to keep your urine clear or pale yellow.  Keep all follow-up visits as told by your health care provider. This is important.  Make sure to: ? Empty your bladder often and completely. Do not hold urine for long periods of time. ? Empty your bladder before and after sex. ? Wipe from front to back after a bowel movement if you are female. Use each tissue one time when you wipe. Contact a health care provider if:  You have back pain.  You have a fever.  You feel nauseous or vomit.  Your symptoms  do not get better after 3 days.  Your symptoms go away and then return. Get help right away if:  You have severe back pain or lower abdominal pain.  You are vomiting and cannot keep down any medicines or water. This information is not intended to replace advice given to you by your health care provider. Make sure you discuss any questions you have with your health care provider. Document  Released: 03/26/2005 Document Revised: 11/28/2015 Document Reviewed: 05/07/2015 Elsevier Interactive Patient Education  [employer].

## 2018-04-02 NOTE — Progress Notes (Signed)
This is a 32 y.o. female who presents today with UTI symptoms for 1 days.  + dysuria + frequency + urgency Positive hematuria No vaginal discharge Had low-grade fever without chills. Complains of mild suprapubic and mild left flank pain, but no back pain. Had some nausea, but that resolved.  Tolerating p.o.'s No vomiting No fatigue, lightheadedness or syncope. She denies chance of pregnancy.  LMP 03/12/2018 Tried Azo, which helped dysuria a little bit. Review of systems otherwise negative.  Past medical history: Denies history of frequent recurrent UTIs, but has had a couple of uncomplicated urinary tract infections in her life.  She states last UTI was over a year ago. No known drug allergies.  Objective: Pleasant female, no acute distress height 5 feet 4 inches, weight 173 pounds.  99.2, respirations 12 oxygen saturation.  98% on room air. Oropharynx clear and moist Neck negative. Lungs, equal expansion Heart, regular rate and rhythm Back, nontender.  No CVA tenderness. Abdomen: Soft, minimal suprapubic and left flank tenderness but no other tenderness, masses, guarding, or rebound. Skin: No rash.  Normal skin turgor. Extremities: Normal capillary refill.  Urinalysis today: +3+ blood, 2+ protein, positive nitrate, 1+ leukocytes.  Negative for glucose.  Assessment: Uncomplicated acute cystitis.  By her symptoms and minimal left flank tenderness, she might have early onset ascending nephritis, but no evidence of pyelonephritis.  No significant systemic symptoms.  In my opinion, she can be treated as an outpatient with close follow-up if not improving in 3 to 5 days.  Plans: Treatment options discussed, as well as risks, benefits, alternatives. Patient voiced understanding and agreement with the following plans: New Prescriptions   CEPHALEXIN (KEFLEX) 250 MG CAPSULE    Take 1 capsule (250 mg total) by mouth 3 (three) times daily. For 7 days  Send off urine culture. An After Visit  Summary was printed and given to the patient. Urinary tract information sheet.  "Take antibiotics as prescribed. Okay to take Azo today if needed for urinary pain. We are sending off a urine culture. Follow-up with your PCP or this clinic if not improving in 3 to 5 days, sooner if worse or new symptoms."  Precautions discussed. Red flags discussed. Questions invited and answered. Patient voiced understanding and agreement.

## 2018-04-05 ENCOUNTER — Telehealth: Payer: Self-pay | Admitting: Emergency Medicine

## 2018-04-05 LAB — URINE CULTURE

## 2018-04-05 MED ORDER — FLUCONAZOLE 150 MG PO TABS: ORAL_TABLET | ORAL | 0 refills | Status: DC

## 2018-04-05 NOTE — Telephone Encounter (Signed)
I called patient.Informed urine culture grew out E. coli, sensitive to cephalosporins.Pan sensitive to other antibiotics.Pt states UTI sxs improving. C/o new yeast infxn sxs from abx, requests Diflucan. Rx sent. advise f/u here or with PCP as needed

## 2018-09-18 ENCOUNTER — Emergency Department
Admission: EM | Admit: 2018-09-18 | Discharge: 2018-09-18 | Disposition: A | Discharge status: Home / Self Care | Payer: Managed Care, Other (non HMO) | Attending: Emergency Medicine | Admitting: Emergency Medicine

## 2018-09-18 ENCOUNTER — Encounter: Payer: Self-pay | Admitting: Emergency Medicine

## 2018-09-18 ENCOUNTER — Other Ambulatory Visit: Payer: Self-pay

## 2018-09-18 DIAGNOSIS — N946 Dysmenorrhea, unspecified: Secondary | ICD-10-CM | POA: Insufficient documentation

## 2018-09-18 LAB — URINALYSIS, COMPLETE (UACMP) WITH MICROSCOPIC
BACTERIA UA: NONE SEEN
Bilirubin Urine: NEGATIVE
Glucose, UA: NEGATIVE mg/dL
KETONES UR: 80 mg/dL — AB
Leukocytes,Ua: NEGATIVE
Nitrite: NEGATIVE
PROTEIN: NEGATIVE mg/dL
Specific Gravity, Urine: 1.013 (ref 1.005–1.030)
pH: 5 (ref 5.0–8.0)

## 2018-09-18 LAB — POCT PREGNANCY, URINE: PREG TEST UR: NEGATIVE

## 2018-09-18 MED ORDER — HYDROCODONE-ACETAMINOPHEN 5-325 MG PO TABS
1.0000 | ORAL_TABLET | Freq: Once | ORAL | Status: AC
  Administered 2018-09-18: 1 via ORAL
  Filled 2018-09-18: qty 1

## 2018-09-18 MED ORDER — HYDROCODONE-ACETAMINOPHEN 5-325 MG PO TABS: 1.0000 | ORAL_TABLET | Freq: Three times a day (TID) | ORAL | 0 refills | Status: AC | PRN

## 2018-09-18 MED ORDER — ONDANSETRON 4 MG PO TBDP: 4.0000 mg | ORAL_TABLET | Freq: Three times a day (TID) | ORAL | 0 refills | Status: DC | PRN

## 2018-09-18 MED ORDER — KETOROLAC TROMETHAMINE 30 MG/ML IJ SOLN
30.0000 mg | Freq: Once | INTRAMUSCULAR | Status: AC
  Administered 2018-09-18: 30 mg via INTRAMUSCULAR
  Filled 2018-09-18: qty 1

## 2018-09-18 MED ORDER — ONDANSETRON 4 MG PO TBDP
4.0000 mg | ORAL_TABLET | Freq: Once | ORAL | Status: AC
  Administered 2018-09-18: 4 mg via ORAL
  Filled 2018-09-18: qty 1

## 2018-09-18 NOTE — Discharge Instructions (Signed)
Your exam and urine labs are reassuring at this time. Take the prescription meds as directed. Follow-up with your GYN provider for further management.

## 2018-09-18 NOTE — ED Notes (Signed)
See triage note  Presents with some abd cramping  States she started her period yesterday  lmp period was 08/11/2018    States her periods are normally irregular  Denies any heavier bleeding

## 2018-09-18 NOTE — ED Provider Notes (Signed)
[facility] Emergency Department Provider Note ____________________________________________  Time seen: 1219  I have reviewed the triage vital signs and the nursing notes.  HISTORY  Chief Complaint  Abdominal Cramping  HPI Cathy Bray is a 33 y.o. female presents to the ED accompanied by her mother, for evaluation of abdominal/pelvic cramping.  Patient describes cramps that are similar to her previous menstrual cramps, but notes that they are more severe than typical.  She reports onset of a typical menses yesterday, but denies active flow at this time.  She denies any current birth control method and is sexually active.  She denies any abnormal vaginal discharge, leg pain, vomiting, or diarrhea.  She reports some nausea but denies any fevers, chills, or sweats.  Patient took 800 mg of ibuprofen this morning followed by single dose of Pamprin with no significant benefit to her menstrual pain.  She has a history of ovarian cyst abnormal Pap status post LEEP.  Past Medical History:  Diagnosis Date  . Dysplasia of cervix   . Hematuria   . History of LEEP (loop electrosurgical excision procedure) of cervix complicating pregnancy   . Infertility, female   . Missed ab   . Nausea and vomiting during pregnancy   . Ovarian cyst    LEFT- 2.4CM   . S/P urgent classical cesarean section 01/08/2015   Done for malpresentation (fetal arm in vagina) at [redacted]w[redacted]d s/p PPROM    . Second trimester bleeding     Patient Active Problem List   Diagnosis Date Noted  . Abnormal uterine bleeding 07/24/2015  . History of dysmenorrhea 05/17/2015  . Status post exploratory laparotomy 02/15/2015  . Post op infection 01/21/2015  . S/P cesarean section 01/08/2015  . Cervical dysplasia 12/15/2014  . History of loop electrical excision procedure (LEEP) 12/15/2014    Past Surgical History:  Procedure Laterality Date  . CESAREAN SECTION N/A 01/08/2015   Procedure: CESAREAN SECTION;  Surgeon:  Tereso Newcomer, MD;  Location: [facility] ORS;  Service: Obstetrics;  Laterality: N/A;  classical on uterus   . LAPAROTOMY N/A 01/23/2015   Procedure: EXPLORATORY LAPAROTOMY;  Surgeon: Herold Harms, MD;  Location: [facility] ORS;  Service: Gynecology;  Laterality: N/A;  . LEEP  2012   [facility]    Prior to Admission medications   Medication Sig Start Date End Date Taking? Authorizing Provider  HYDROcodone-acetaminophen (NORCO) 5-325 MG tablet Take 1 tablet by mouth 3 (three) times daily as needed for up to 3 days. 09/18/18 09/21/18  Khanh Cordner, Charlesetta Ivory, PA-C  ondansetron (ZOFRAN ODT) 4 MG disintegrating tablet Take 1 tablet (4 mg total) by mouth every 8 (eight) hours as needed. 09/18/18   Diahn Waidelich, Charlesetta Ivory, PA-C    Allergies Shrimp [shellfish allergy]  Family History  Problem Relation Age of Onset  . Diabetes Mother   . Heart disease Neg Hx   . Breast cancer Neg Hx   . Colon cancer Neg Hx   . Ovarian cancer Neg Hx     Social History Social History   Tobacco Use  . Smoking status: Never Smoker  . Smokeless tobacco: Never Used  Substance Use Topics  . Alcohol use: No  . Drug use: No    Review of Systems  Constitutional: Negative for fever. Cardiovascular: Negative for chest pain. Respiratory: Negative for shortness of breath. Gastrointestinal: Negative for abdominal pain, vomiting and diarrhea. Genitourinary: Negative for dysuria.  Reports pelvic cramping as above Musculoskeletal: Negative for back pain. Skin: Negative for rash.  Neurological: Negative for headaches, focal weakness or numbness. ____________________________________________  PHYSICAL EXAM:  VITAL SIGNS: ED Triage Vitals  Enc Vitals Group     BP 09/18/18 1206 132/89     Pulse Rate 09/18/18 1206 90     Resp 09/18/18 1206 18     Temp 09/18/18 1206 97.7 F (36.5 C)     Temp Source 09/18/18 1206 Oral     SpO2 09/18/18 1206 99 %     Weight 09/18/18 1207 153 lb (69.4 kg)     Height 09/18/18 1207 5\' 4"   (1.626 m)     Head Circumference --      Peak Flow --      Pain Score 09/18/18 1207 10     Pain Loc --      Pain Edu? --      Excl. in GC? --     Constitutional: Alert and oriented. Well appearing and in no distress. Head: Normocephalic and atraumatic. Eyes: Conjunctivae are normal. Normal extraocular movements Cardiovascular: Normal rate, regular rhythm. Normal distal pulses. Respiratory: Normal respiratory effort. No wheezes/rales/rhonchi. Gastrointestinal: Soft and nontender. No distention. No CVA tenderness. No lower abdominal pain on palpation GU: Deferred Musculoskeletal: Nontender with normal range of motion in all extremities.  Neurologic:  Normal gait without ataxia. Normal speech and language. No gross focal neurologic deficits are appreciated. Skin:  Skin is warm, dry and intact. No rash noted. Psychiatric: Mood and affect are normal. Patient exhibits appropriate insight and judgment. ____________________________________________   LABS (pertinent positives/negatives) Labs Reviewed  URINALYSIS, COMPLETE (UACMP) WITH MICROSCOPIC - Abnormal; Notable for the following components:      Result Value   Color, Urine YELLOW (*)    APPearance CLEAR (*)    Hgb urine dipstick LARGE (*)    Ketones, ur 80 (*)    All other components within normal limits  POC URINE PREG, ED  POCT PREGNANCY, URINE  ____________________________________________  PROCEDURES  Procedures Toradol 30 mg IM Zofran 4 mg ODT Norco 5-325 mg PO ____________________________________________  INITIAL IMPRESSION / ASSESSMENT AND PLAN / ED COURSE  Patient with ED evaluation of a 1 day complaint of severe menstrual cramps.  Patient with a history of dysmenorrhea presents today for evaluation of her symptoms but she was concerned because her pain not respond to her typical dose of ibuprofen and Pamprin.  She denies any abnormal vaginal bleeding or vaginal discharge at this time.  She is reassured by her normal  exam and labs at this time.  She is referred to her GYN provider for definitive treatment.  A prescription for Zofran and hydrocodone is provided for her to take along with regular dosing of ibuprofen and Pamprin.  Return precautions have been reviewed. ____________________________________________  FINAL CLINICAL IMPRESSION(S) / ED DIAGNOSES  Final diagnoses:  Severe menstrual cramps      Karmen Stabs, Charlesetta Ivory, PA-C 09/18/18 1439    Arnaldo Natal, MD 09/18/18 [phone]

## 2018-09-18 NOTE — ED Triage Notes (Signed)
States began period yesterday. States has menstrual cramps worse than usual. States bleeding is not heavier than usual. Has irregular cycles. Denies possibility of pregnancy.

## 2018-09-24 ENCOUNTER — Telehealth: Payer: Self-pay

## 2018-09-24 NOTE — Telephone Encounter (Signed)
Coronavirus (COVID-19) Are you at risk?  Are you at risk for the Coronavirus (COVID-19)?  To be considered HIGH RISK for Coronavirus (COVID-19), you have to meet the following criteria:  . Traveled to [place], [place], [place], [place] or [place]; or in the [place] to [place], [place], [place], or [place]; and have fever, cough, and shortness of breath within the last 2 weeks of travel OR . Been in close contact with a person diagnosed with COVID-19 within the last 2 weeks and have fever, cough, and shortness of breath . IF YOU DO NOT MEET THESE CRITERIA, YOU ARE CONSIDERED LOW RISK FOR COVID-19.  What to do if you are HIGH RISK for COVID-19?  . If you are having a medical emergency, call 911. . Seek medical care right away. Before you go to a doctor's office, urgent care or emergency department, call ahead and tell them about your recent travel, contact with someone diagnosed with COVID-19, and your symptoms. You should receive instructions from your physician's office regarding next steps of care.  . When you arrive at healthcare provider, tell the healthcare staff immediately you have returned from visiting [place], [place], [place], [place] or [place]; or traveled in the [place] to [place], [place], [place], or [place]; in the last two weeks or you have been in close contact with a person diagnosed with COVID-19 in the last 2 weeks.   . Tell the health care staff about your symptoms: fever, cough and shortness of breath. . After you have been seen by a medical provider, you will be either: o Tested for (COVID-19) and discharged home on quarantine except to seek medical care if symptoms worsen, and asked to  - Stay home and avoid contact with others until you get your results (4-5 days)  - Avoid travel on public transportation if possible (such as bus, train, or airplane) or o Sent to the Emergency Department by EMS for evaluation, COVID-19 testing, and possible  admission depending on your condition and test results.  What to do if you are LOW RISK for COVID-19?  Reduce your risk of any infection by using the same precautions used for avoiding the common cold or flu:  . Wash your hands often with soap and warm water for at least 20 seconds.  If soap and water are not readily available, use an alcohol-based hand sanitizer with at least 60% alcohol.  . If coughing or sneezing, cover your mouth and nose by coughing or sneezing into the elbow areas of your shirt or coat, into a tissue or into your sleeve (not your hands). . Avoid shaking hands with others and consider head nods or verbal greetings only. . Avoid touching your eyes, nose, or mouth with unwashed hands.  . Avoid close contact with people who are sick. . Avoid places or events with large numbers of people in one location, like concerts or sporting events. . Carefully consider travel plans you have or are making. . If you are planning any travel outside or inside the [place], visit the CDC's Travelers' Health webpage for the latest health notices. . If you have some symptoms but not all symptoms, continue to monitor at home and seek medical attention if your symptoms worsen. . If you are having a medical emergency, call 911.   ADDITIONAL HEALTHCARE OPTIONS FOR PATIENTS  [facility] / e-Visit: [email or web address]         [facility]: [phone]  [facility]: [phone]                   [facility]: [phone]  Prescreened for covid19- neg. cm

## 2018-09-27 ENCOUNTER — Other Ambulatory Visit (HOSPITAL_COMMUNITY)
Admission: RE | Admit: 2018-09-27 | Discharge: 2018-09-27 | Disposition: A | Discharge status: Home / Self Care | Payer: Managed Care, Other (non HMO) | Source: Ambulatory Visit | Attending: Certified Nurse Midwife | Admitting: Certified Nurse Midwife

## 2018-09-27 ENCOUNTER — Ambulatory Visit (INDEPENDENT_AMBULATORY_CARE_PROVIDER_SITE_OTHER): Payer: Managed Care, Other (non HMO) | Admitting: Certified Nurse Midwife

## 2018-09-27 ENCOUNTER — Encounter: Payer: Self-pay | Admitting: Certified Nurse Midwife

## 2018-09-27 ENCOUNTER — Other Ambulatory Visit: Payer: Self-pay

## 2018-09-27 VITALS — BP 116/85 | HR 83 | Ht 64.0 in | Wt 153.7 lb

## 2018-09-27 DIAGNOSIS — N946 Dysmenorrhea, unspecified: Secondary | ICD-10-CM | POA: Diagnosis not present

## 2018-09-27 DIAGNOSIS — R109 Unspecified abdominal pain: Secondary | ICD-10-CM | POA: Insufficient documentation

## 2018-09-27 DIAGNOSIS — Z8742 Personal history of other diseases of the female genital tract: Secondary | ICD-10-CM

## 2018-09-27 NOTE — Patient Instructions (Signed)
Dysmenorrhea    Dysmenorrhea refers to cramps caused by the muscles of the uterus tightening (contracting) during a menstrual period. Dysmenorrhea may be mild, or it may be severe enough to interfere with everyday activities for a few days each month.  Primary dysmenorrhea is menstrual cramps that last a couple of days when you start having menstrual periods or soon after. This often begins after a teenager starts having her period. As a woman gets older or has a baby, the cramps will usually lessen or disappear. Secondary dysmenorrhea begins later in life and is caused by a disorder in the reproductive system. It lasts longer, and it may cause more pain than primary dysmenorrhea. The pain may start before the period and last a few days after the period.  What are the causes?  Dysmenorrhea is usually caused by an underlying problem, such as:  · The tissue that lines the uterus (endometrium) growing outside of the uterus in other areas of the body (endometriosis).  · Endometrial tissue growing into the muscular walls of the uterus (adenomyosis).  · Blood vessels in the pelvis becoming filled with blood just before the menstrual period (pelvic congestive syndrome).  · Overgrowth of cells (polyps) in the endometrium or the lower part of the uterus (cervix).  · The uterus dropping down into the vagina (prolapse) due to stretched or weak muscles.  · Bladder problems, such as infection or inflammation.  · Intestinal problems, such as a tumor or irritable bowel syndrome.  · Cancer of the reproductive organs or bladder.  · A severely tipped uterus.  · A cervix that is closed or has a very small opening.  · Noncancerous (benign) tumors of the uterus (fibroids).  · Pelvic inflammatory disease (PID).  · Pelvic scarring (adhesions) from a previous surgery.  · An ovarian cyst.  · An IUD (intrauterine device).  What increases the risk?  You are more likely to develop this condition if:  · You are younger than age 30.  · You  started puberty early.  · You have irregular or heavy bleeding.  · You have never given birth.  · You have a family history of dysmenorrhea.  · You smoke.  What are the signs or symptoms?  Symptoms of this condition include:  · Cramping, throbbing pain, or a feeling of fullness in the lower abdomen.  · Lower back pain.  · Periods lasting for longer than 7 days.  · Headaches.  · Bloating.  · Fatigue.  · Nausea or vomiting.  · Diarrhea.  · Sweating or dizziness.  · Loose stools.  How is this diagnosed?  This condition may be diagnosed based on:  · Your symptoms.  · Your medical history.  · A physical exam.  · Blood tests.  · A Pap test. This is a test in which cells from the cervix are tested for signs of cancer or infection.  · A pregnancy test.  · Imaging tests, such as:  ? Ultrasound.  ? A procedure to remove and examine a sample of endometrial tissue (dilation and curettage, D&C).  ? A procedure to visually examine the inside of:  § The uterus (hysteroscopy).  § The abdomen or pelvis (laparoscopy).  § The bladder (cystoscopy).  § The intestine (colonoscopy).  § The stomach (gastroscopy).  ? X-rays.  ? CT scan.  ? MRI.  How is this treated?  Treatment depends on the cause of the dysmenorrhea. Treatment may include:  · Pain medicine prescribed by your health   care provider.  · Birth control pills that contain the hormone progesterone.  · An IUD that contains the hormone progesterone.  · Medicines to control bleeding.  · Hormone replacement therapy.  · NSAIDs. These may help to stop the production of hormones that cause cramps.  · Antidepressant medicines.  · Surgery to remove adhesions, endometriosis, ovarian cysts, fibroids, or the entire uterus (hysterectomy).  · Injections of progesterone to stop the menstrual period.  · A procedure to destroy the endometrium (endometrial ablation).  · A procedure to cut the nerves in the bottom of the spine (sacrum) that go to the reproductive organs (presacral neurectomy).  · A  procedure to apply an electric current to nerves in the sacrum (sacral nerve stimulation).  · Exercise and physical therapy.  · Meditation and yoga therapy.  · Acupuncture.  Work with your health care provider to determine what treatment or combination of treatments is best for you.  Follow these instructions at home:  Relieving pain and cramping  · Apply heat to your lower back or abdomen when you experience pain or cramps. Use the heat source that your health care provider recommends, such as a moist heat pack or a heating pad.  ? Place a towel between your skin and the heat source.  ? Leave the heat on for 20-30 minutes.  ? Remove the heat if your skin turns bright red. This is especially important if you are unable to feel pain, heat, or cold. You may have a greater risk of getting burned.  ? Do not sleep with a heating pad on.  · Do aerobic exercises, such as walking, swimming, or biking. This can help to relieve cramps.  · Massage your lower back or abdomen to help relieve pain.  General instructions  · Take over-the-counter and prescription medicines only as told by your health care provider.  · Do not drive or use heavy machinery while taking prescription pain medicine.  · Avoid alcohol and caffeine during and right before your menstrual period. These can make cramps worse.  · Do not use any products that contain nicotine or tobacco, such as cigarettes and e-cigarettes. If you need help quitting, ask your health care provider.  · Keep all follow-up visits as told by your health care provider. This is important.  Contact a health care provider if:  · You have pain that gets worse or does not get better with medicine.  · You have pain with sex.  · You develop nausea or vomiting with your period that is not controlled with medicine.  Get help right away if:  · You faint.  Summary  · Dysmenorrhea refers to cramps caused by the muscles of the uterus tightening (contracting) during a menstrual  period.  · Dysmenorrhea may be mild, or it may be severe enough to interfere with everyday activities for a few days each month.  · Treatment depends on the cause of the dysmenorrhea.  · Work with your health care provider to determine what treatment or combination of treatments is best for you.  This information is not intended to replace advice given to you by your health care provider. Make sure you discuss any questions you have with your health care provider.  Document Released: 06/16/2005 Document Revised: 07/19/2016 Document Reviewed: 07/19/2016  Elsevier Interactive Patient Education © 2019 [employer]

## 2018-09-27 NOTE — Progress Notes (Signed)
GYN ENCOUNTER NOTE  Subjective:       Cathy Bray is a 33 y.o. [phone] female is here for gynecologic evaluation of the following issues:  1. Painful periods  Notes abdominal cramping with menses this month unrelieved by Motrin and Pamprin. Patient was seen in ER and given oral narcotic and antiemetic.   History significant for irregular cycle and ovarian cyst. AUB labs on 11/12/2017 normal. Last pelvic ultrasound on 07/24/2015 normal. Declines hormonal contraception. Has tried pills, depo provera, and Mirena in the pass to manage symptoms without success.   Questions possibility of BTL for sterilization or hysterectomy for resolution of all symptoms.   Denies difficulty breathing or respiratory distress, chest pain, excessive vaginal bleeding, dysuria, and leg pain or swelling.   Gynecologic History  Patient's last menstrual period was 09/17/2018.  Contraception: coitus interruptus  Last Pap: 10/2017. Results were: Negative/Negative  Last mammogram: 05/2015. Results were: BI-RADS 2  Obstetric History  OB History  Gravida Para Term Preterm AB Living  4 3 2 1 1 3   SAB TAB Ectopic Multiple Live Births  1     0 3    # Outcome Date GA Lbr Len/2nd Weight Sex Delivery Anes PTL Lv  4 Preterm 01/08/15 [redacted]w[redacted]d  2 lb 6.8 oz (1.1 kg) M CS-Classical Spinal  LIV     Birth Comments: preterm  3 SAB 2015          2 Term 2008   7 lb (3.175 kg) M Vag-Spont   LIV  1 Term 2004   7 lb (3.175 kg) M Vag-Spont   LIV    Past Medical History:  Diagnosis Date  . Dysplasia of cervix   . Hematuria   . History of LEEP (loop electrosurgical excision procedure) of cervix complicating pregnancy   . Infertility, female   . Missed ab   . Nausea and vomiting during pregnancy   . Ovarian cyst    LEFT- 2.4CM   . S/P urgent classical cesarean section 01/08/2015   Done for malpresentation (fetal arm in vagina) at [redacted]w[redacted]d s/p PPROM    . Second trimester bleeding     Past Surgical History:  Procedure  Laterality Date  . CESAREAN SECTION N/A 01/08/2015   Procedure: CESAREAN SECTION;  Surgeon: Tereso Newcomer, MD;  Location: [facility] ORS;  Service: Obstetrics;  Laterality: N/A;  classical on uterus   . LAPAROTOMY N/A 01/23/2015   Procedure: EXPLORATORY LAPAROTOMY;  Surgeon: Herold Harms, MD;  Location: [facility] ORS;  Service: Gynecology;  Laterality: N/A;  . LEEP  2012   [facility]    Current Outpatient Medications on File Prior to Visit  Medication Sig Dispense Refill  . ondansetron (ZOFRAN ODT) 4 MG disintegrating tablet Take 1 tablet (4 mg total) by mouth every 8 (eight) hours as needed. (Patient not taking: Reported on 09/27/2018) 15 tablet 0   No current facility-administered medications on file prior to visit.     Allergies  Allergen Reactions  . Shrimp [Shellfish Allergy] Hives    Social History   Socioeconomic History  . Marital status: Married    Spouse name: Not on file  . Number of children: Not on file  . Years of education: Not on file  . Highest education level: Not on file  Occupational History  . Not on file  Social Needs  . Financial resource strain: Not on file  . Food insecurity:    Worry: Not on file    Inability: Not on file  .  Transportation needs:    Medical: Not on file    Non-medical: Not on file  Tobacco Use  . Smoking status: Never Smoker  . Smokeless tobacco: Never Used  Substance and Sexual Activity  . Alcohol use: No  . Drug use: No  . Sexual activity: Yes    Birth control/protection: None  Lifestyle  . Physical activity:    Days per week: 5 days    Minutes per session: 30 min  . Stress: Not on file  Relationships  . Social connections:    Talks on phone: Not on file    Gets together: Not on file    Attends religious service: Not on file    Active member of club or organization: Not on file    Attends meetings of clubs or organizations: Not on file    Relationship status: Not on file  . Intimate partner violence:    Fear of current or  ex partner: Not on file    Emotionally abused: Not on file    Physically abused: Not on file    Forced sexual activity: Not on file  Other Topics Concern  . Not on file  Social History Narrative  . Not on file    Family History  Problem Relation Age of Onset  . Diabetes Mother   . Heart disease Neg Hx   . Breast cancer Neg Hx   . Colon cancer Neg Hx   . Ovarian cancer Neg Hx     The following portions of the patient's history were reviewed and updated as appropriate: allergies, current medications, past family history, past medical history, past social history, past surgical history and problem list.  Review of Systems  ROS negative except as noted above. Information obtained from patient.   Objective:   BP 116/85   Pulse 83   Ht 5\' 4"  (1.626 m)   Wt 153 lb 11.2 oz (69.7 kg)   LMP 09/17/2018   BMI 26.38 kg/m    CONSTITUTIONAL: Well-developed, well-nourished female in no acute distress.   ABDOMEN: Soft, non distended; Non tender.  No Organomegaly.  PELVIC:  External Genitalia: Normal  Vagina: White discharge present, vaginal swab collected  Cervix: Normal  Uterus: Normal size, shape,consistency, mobile  Adnexa: Normal  MUSCULOSKELETAL: Normal range of motion. No tenderness.  No cyanosis, clubbing, or edema.  Assessment:   1. Dysmenorrhea   Plan:   Labs: urine culture and vaginal swab, see orders.   Discussed herbal options for management of symptoms; handouts given.   Offered ultrasound, will scheduled after trial of herbs.   Education regarding COVID restrictions. Advised to follow up with MD in a few months if BTL or hysterectomy is desired.   Reviewed red flag symptoms and when to call.   RTC as needed.    Gunnar Bulla, CNM [facility], [facility] 09/27/18 2:46 PM

## 2018-09-28 LAB — CERVICOVAGINAL ANCILLARY ONLY
Bacterial vaginitis: NEGATIVE
Candida vaginitis: NEGATIVE

## 2018-12-20 ENCOUNTER — Telehealth: Payer: Self-pay

## 2018-12-20 NOTE — Telephone Encounter (Signed)
Coronavirus (COVID-19) Are you at risk?  Are you at risk for the Coronavirus (COVID-19)?  To be considered HIGH RISK for Coronavirus (COVID-19), you have to meet the following criteria:  . Traveled to [place], [place], [place], [place] or [place]; or in the [place] to [place], [place], [place], or [place]; and have fever, cough, and shortness of breath within the last 2 weeks of travel OR . Been in close contact with a person diagnosed with COVID-19 within the last 2 weeks and have fever, cough, and shortness of breath . IF YOU DO NOT MEET THESE CRITERIA, YOU ARE CONSIDERED LOW RISK FOR COVID-19.  What to do if you are HIGH RISK for COVID-19?  . If you are having a medical emergency, call 911. . Seek medical care right away. Before you go to a doctor's office, urgent care or emergency department, call ahead and tell them about your recent travel, contact with someone diagnosed with COVID-19, and your symptoms. You should receive instructions from your physician's office regarding next steps of care.  . When you arrive at healthcare provider, tell the healthcare staff immediately you have returned from visiting [place], [place], [place], [place] or [place]; or traveled in the [place] to [place], [place], [place], or [place]; in the last two weeks or you have been in close contact with a person diagnosed with COVID-19 in the last 2 weeks.   . Tell the health care staff about your symptoms: fever, cough and shortness of breath. . After you have been seen by a medical provider, you will be either: o Tested for (COVID-19) and discharged home on quarantine except to seek medical care if symptoms worsen, and asked to  - Stay home and avoid contact with others until you get your results (4-5 days)  - Avoid travel on public transportation if possible (such as bus, train, or airplane) or o Sent to the Emergency Department by EMS for evaluation, COVID-19 testing, and possible  admission depending on your condition and test results.  What to do if you are LOW RISK for COVID-19?  Reduce your risk of any infection by using the same precautions used for avoiding the common cold or flu:  . Wash your hands often with soap and warm water for at least 20 seconds.  If soap and water are not readily available, use an alcohol-based hand sanitizer with at least 60% alcohol.  . If coughing or sneezing, cover your mouth and nose by coughing or sneezing into the elbow areas of your shirt or coat, into a tissue or into your sleeve (not your hands). . Avoid shaking hands with others and consider head nods or verbal greetings only. . Avoid touching your eyes, nose, or mouth with unwashed hands.  . Avoid close contact with people who are sick. . Avoid places or events with large numbers of people in one location, like concerts or sporting events. . Carefully consider travel plans you have or are making. . If you are planning any travel outside or inside the [place], visit the CDC's Travelers' Health webpage for the latest health notices. . If you have some symptoms but not all symptoms, continue to monitor at home and seek medical attention if your symptoms worsen. . If you are having a medical emergency, call 911.   ADDITIONAL HEALTHCARE OPTIONS FOR PATIENTS  [facility] / e-Visit: [email or web address]         [facility]: [phone]  [facility]   [facility]: [phone]                   [facility]: [phone]   Pre-screen negative, DM.   

## 2018-12-21 ENCOUNTER — Encounter: Payer: Self-pay | Admitting: Certified Nurse Midwife

## 2018-12-21 ENCOUNTER — Other Ambulatory Visit: Payer: Self-pay

## 2018-12-21 ENCOUNTER — Ambulatory Visit (INDEPENDENT_AMBULATORY_CARE_PROVIDER_SITE_OTHER): Payer: Managed Care, Other (non HMO) | Admitting: Certified Nurse Midwife

## 2018-12-21 VITALS — BP 118/87 | HR 81 | Ht 64.0 in | Wt 150.6 lb

## 2018-12-21 DIAGNOSIS — Z01419 Encounter for gynecological examination (general) (routine) without abnormal findings: Secondary | ICD-10-CM | POA: Diagnosis not present

## 2018-12-21 DIAGNOSIS — E663 Overweight: Secondary | ICD-10-CM

## 2018-12-21 NOTE — Progress Notes (Signed)
Patient here for annual exam, no complaints.  
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
## 2018-12-21 NOTE — Patient Instructions (Signed)
Preventive Care 18-39 Years, Female Preventive care refers to lifestyle choices and visits with your health care provider that can promote health and wellness. What does preventive care include?   A yearly physical exam. This is also called an annual well check.  Dental exams once or twice a year.  Routine eye exams. Ask your health care provider how often you should have your eyes checked.  Personal lifestyle choices, including: ? Daily care of your teeth and gums. ? Regular physical activity. ? Eating a healthy diet. ? Avoiding tobacco and drug use. ? Limiting alcohol use. ? Practicing safe sex. ? Taking vitamin and mineral supplements as recommended by your health care provider. What happens during an annual well check? The services and screenings done by your health care provider during your annual well check will depend on your age, overall health, lifestyle risk factors, and family history of disease. Counseling Your health care provider may ask you questions about your:  Alcohol use.  Tobacco use.  Drug use.  Emotional well-being.  Home and relationship well-being.  Sexual activity.  Eating habits.  Work and work environment.  Method of birth control.  Menstrual cycle.  Pregnancy history. Screening You may have the following tests or measurements:  Height, weight, and BMI.  Diabetes screening. This is done by checking your blood sugar (glucose) after you have not eaten for a while (fasting).  Blood pressure.  Lipid and cholesterol levels. These may be checked every 5 years starting at age 20.  Skin check.  Hepatitis C blood test.  Hepatitis B blood test.  Sexually transmitted disease (STD) testing.  BRCA-related cancer screening. This may be done if you have a family history of breast, ovarian, tubal, or peritoneal cancers.  Pelvic exam and Pap test. This may be done every 3 years starting at age 21. Starting at age 30, this may be done every 5  years if you have a Pap test in combination with an HPV test. Discuss your test results, treatment options, and if necessary, the need for more tests with your health care provider. Vaccines Your health care provider may recommend certain vaccines, such as:  Influenza vaccine. This is recommended every year.  Tetanus, diphtheria, and acellular pertussis (Tdap, Td) vaccine. You may need a Td booster every 10 years.  Varicella vaccine. You may need this if you have not been vaccinated.  HPV vaccine. If you are 26 or younger, you may need three doses over 6 months.  Measles, mumps, and rubella (MMR) vaccine. You may need at least one dose of MMR. You may also need a second dose.  Pneumococcal 13-valent conjugate (PCV13) vaccine. You may need this if you have certain conditions and were not previously vaccinated.  Pneumococcal polysaccharide (PPSV23) vaccine. You may need one or two doses if you smoke cigarettes or if you have certain conditions.  Meningococcal vaccine. One dose is recommended if you are age 19-21 years and a first-year college student living in a residence hall, or if you have one of several medical conditions. You may also need additional booster doses.  Hepatitis A vaccine. You may need this if you have certain conditions or if you travel or work in places where you may be exposed to hepatitis A.  Hepatitis B vaccine. You may need this if you have certain conditions or if you travel or work in places where you may be exposed to hepatitis B.  Haemophilus influenzae type b (Hib) vaccine. You may need this if you   have certain risk factors. Talk to your health care provider about which screenings and vaccines you need and how often you need them. This information is not intended to replace advice given to you by your health care provider. Make sure you discuss any questions you have with your health care provider. Document Released: 08/12/2001 Document Revised: 01/27/2017  Document Reviewed: 04/17/2015 Elsevier Interactive Patient Education  2019 [employer].

## 2018-12-22 ENCOUNTER — Encounter: Payer: Self-pay | Admitting: Certified Nurse Midwife

## 2018-12-22 LAB — LIPID PANEL
Chol/HDL Ratio: 4.1 ratio (ref 0.0–4.4)
Cholesterol, Total: 170 mg/dL (ref 100–199)
HDL: 41 mg/dL (ref >39)
LDL Calculated: 118 mg/dL — ABNORMAL HIGH (ref 0–99)
Triglycerides: 55 mg/dL (ref 0–149)
VLDL Cholesterol Cal: 11 mg/dL (ref 5–40)

## 2018-12-22 LAB — GLUCOSE, RANDOM: Glucose: 87 mg/dL (ref 65–99)

## 2018-12-23 NOTE — Addendum Note (Signed)
Addended by: Garner Nash on: 12/23/2018 09:08 AM   Modules accepted: Orders

## 2018-12-29 LAB — HEPATIC FUNCTION PANEL
ALT: 19 IU/L (ref 0–32)
AST: 14 IU/L (ref 0–40)
Albumin: 4.4 g/dL (ref 3.8–4.8)
Alkaline Phosphatase: 82 IU/L (ref 39–117)
Bilirubin Total: 0.5 mg/dL (ref 0.0–1.2)
Bilirubin, Direct: 0.12 mg/dL (ref 0.00–0.40)
Total Protein: 6.1 g/dL (ref 6.0–8.5)

## 2018-12-29 LAB — SPECIMEN STATUS REPORT

## 2019-02-18 ENCOUNTER — Ambulatory Visit: Payer: Managed Care, Other (non HMO) | Admitting: Podiatry

## 2019-02-18 ENCOUNTER — Other Ambulatory Visit: Payer: Self-pay

## 2019-02-18 ENCOUNTER — Encounter: Payer: Self-pay | Admitting: Podiatry

## 2019-02-18 VITALS — Temp 98.4°F

## 2019-02-18 DIAGNOSIS — B353 Tinea pedis: Secondary | ICD-10-CM | POA: Diagnosis not present

## 2019-02-18 DIAGNOSIS — L600 Ingrowing nail: Secondary | ICD-10-CM

## 2019-02-18 MED ORDER — TERBINAFINE HCL 250 MG PO TABS: 250.0000 mg | ORAL_TABLET | Freq: Every day | ORAL | 0 refills | Status: DC

## 2019-02-18 MED ORDER — CLOTRIMAZOLE-BETAMETHASONE 1-0.05 % EX CREA: 1.0000 "application " | TOPICAL_CREAM | Freq: Two times a day (BID) | CUTANEOUS | 2 refills | Status: DC

## 2019-02-18 MED ORDER — GENTAMICIN SULFATE 0.1 % EX CREA: 1.0000 "application " | TOPICAL_CREAM | Freq: Two times a day (BID) | CUTANEOUS | 1 refills | Status: DC

## 2019-02-18 MED ORDER — CEPHALEXIN 500 MG PO CAPS: 500.0000 mg | ORAL_CAPSULE | Freq: Three times a day (TID) | ORAL | 0 refills | Status: DC

## 2019-02-18 NOTE — Patient Instructions (Signed)

## 2019-02-20 NOTE — Progress Notes (Signed)
   Subjective: Patient presents today for evaluation of pain to the medial borders of the bilateral great toes that began about two weeks ago. Patient is concerned for possible ingrown nail. Wearing shoes and touching the toes increases the pain. She has not had any treatment for the symptoms. Patient presents today for further treatment and evaluation.  Past Medical History:  Diagnosis Date  . Dysplasia of cervix   . Hematuria   . History of LEEP (loop electrosurgical excision procedure) of cervix complicating pregnancy   . Infertility, female   . Missed ab   . Nausea and vomiting during pregnancy   . Ovarian cyst    LEFT- 2.4CM   . S/P urgent classical cesarean section 01/08/2015   Done for malpresentation (fetal arm in vagina) at [redacted]w[redacted]d s/p PPROM    . Second trimester bleeding     Objective:  General: Well developed, nourished, in no acute distress, alert and oriented x3   Dermatology: Skin is warm, dry and supple bilateral. Medial borders of the bilateral great toes appears to be erythematous with evidence of an ingrowing nail. Pain on palpation noted to the border of the nail fold. Pruritus noted to the right foot with hyperkeratosis. There are no open sores, lesions.  Vascular: Dorsalis Pedis artery and Posterior Tibial artery pedal pulses palpable. No lower extremity edema noted.   Neruologic: Grossly intact via light touch bilateral.  Musculoskeletal: Muscular strength within normal limits in all groups bilateral. Normal range of motion noted to all pedal and ankle joints.   Assesement: #1 Paronychia with ingrowing nail medial borders bilateral great toes #2 Pain in toe #3 Incurvated nail  Plan of Care:  1. Patient evaluated.  2. Discussed treatment alternatives and plan of care. Explained nail avulsion procedure and post procedure course to patient. 3. Patient opted for permanent partial nail avulsion of the medial borders of the bilateral great toes.  4. Prior to  procedure, local anesthesia infiltration utilized using 3 ml of a 50:50 mixture of 2% plain lidocaine and 0.5% plain marcaine in a normal hallux block fashion and a betadine prep performed.  5. Partial permanent nail avulsion with chemical matrixectomy performed using [id number] applications of phenol followed by alcohol flush.  6. Light dressing applied. 7. Prescription for Gentamicin cream provided to patient to use daily with a bandage.  8. Prescription for Keflex 500 mg TID for 7 days provided to patient.  9. Prescription for Lotrisone cream provided to patient.  10. Prescription for Lamisil 250 mg #28 provided to patient.  11. Return to clinic in 2 weeks.  Edrick Kins, DPM [facility]  Dr. Edrick Kins, [address]                                        [place], [place] [address]                Office [phone]  Fax [phone]

## 2019-02-22 ENCOUNTER — Other Ambulatory Visit: Payer: Self-pay

## 2019-02-22 DIAGNOSIS — Z20822 Contact with and (suspected) exposure to covid-19: Secondary | ICD-10-CM

## 2019-02-23 LAB — NOVEL CORONAVIRUS, NAA: SARS-CoV-2, NAA: NOT DETECTED

## 2019-03-04 ENCOUNTER — Ambulatory Visit: Payer: Managed Care, Other (non HMO) | Admitting: Podiatry

## 2019-04-15 ENCOUNTER — Other Ambulatory Visit: Payer: Self-pay

## 2019-04-15 DIAGNOSIS — Z20822 Contact with and (suspected) exposure to covid-19: Secondary | ICD-10-CM

## 2019-04-17 LAB — NOVEL CORONAVIRUS, NAA: SARS-CoV-2, NAA: NOT DETECTED

## 2019-12-23 ENCOUNTER — Ambulatory Visit (INDEPENDENT_AMBULATORY_CARE_PROVIDER_SITE_OTHER): Payer: Managed Care, Other (non HMO) | Admitting: Certified Nurse Midwife

## 2019-12-23 ENCOUNTER — Encounter: Payer: Managed Care, Other (non HMO) | Admitting: Certified Nurse Midwife

## 2019-12-23 ENCOUNTER — Other Ambulatory Visit: Payer: Self-pay

## 2019-12-23 ENCOUNTER — Encounter: Payer: Self-pay | Admitting: Certified Nurse Midwife

## 2019-12-23 VITALS — BP 115/85 | HR 81 | Ht 64.0 in | Wt 165.1 lb

## 2019-12-23 DIAGNOSIS — Z6828 Body mass index (BMI) 28.0-28.9, adult: Secondary | ICD-10-CM

## 2019-12-23 DIAGNOSIS — Z8742 Personal history of other diseases of the female genital tract: Secondary | ICD-10-CM | POA: Diagnosis not present

## 2019-12-23 DIAGNOSIS — E7800 Pure hypercholesterolemia, unspecified: Secondary | ICD-10-CM

## 2019-12-23 DIAGNOSIS — Z01419 Encounter for gynecological examination (general) (routine) without abnormal findings: Secondary | ICD-10-CM | POA: Diagnosis not present

## 2019-12-23 DIAGNOSIS — N926 Irregular menstruation, unspecified: Secondary | ICD-10-CM

## 2019-12-23 DIAGNOSIS — N946 Dysmenorrhea, unspecified: Secondary | ICD-10-CM

## 2019-12-23 NOTE — Patient Instructions (Addendum)
Dysmenorrhea Dysmenorrhea means painful cramps during your period (menstrual period). You will have pain in your lower belly (abdomen). The pain is caused by the tightening (contracting) of the muscles of the womb (uterus). The pain may be mild or very bad. With this condition, you may:  Have a headache.  Feel sick to your stomach (nauseous).  Throw up (vomit).  Have lower back pain. Follow these instructions at home: Helping pain and cramping   Put heat on your lower back or belly when you have pain or cramps. Use the heat source that your doctor tells you to use. ? Place a towel between your skin and the heat. ? Leave the heat on for 20-30 minutes. ? Remove the heat if your skin turns bright red. This is especially important if you cannot feel pain, heat, or cold. ? Do not have a heating pad on during sleep.  Do aerobic exercises. These include walking, swimming, or biking. These may help with cramps.  Massage your lower back or belly. This may help lessen pain. General instructions  Take over-the-counter and prescription medicines only as told by your doctor.  Do not drive or use heavy machinery while taking prescription pain medicine.  Avoid alcohol and caffeine during and right before your period. These can make cramps worse.  Do not use any products that have nicotine or tobacco. These include cigarettes and e-cigarettes. If you need help quitting, ask your doctor.  Keep all follow-up visits as told by your doctor. This is important. Contact a doctor if:  You have pain that gets worse.  You have pain that does not get better with medicine.  You have pain during sex.  You feel sick to your stomach or you throw up during your period, and medicine does not help. Get help right away if:  You pass out (faint). Summary  Dysmenorrhea means painful cramps during your period (menstrual period).  Put heat on your lower back or belly when you have pain or cramps.  Do  exercises like walking, swimming, or biking to help with cramps.  Contact a doctor if you have pain during sex. This information is not intended to replace advice given to you by your health care provider. Make sure you discuss any questions you have with your health care provider. Document Revised: 05/29/2017 Document Reviewed: 07/03/2016 Elsevier Patient Education  2020 [place] 55-55 Years Old, Female Preventive care refers to visits with your health care provider and lifestyle choices that can promote health and wellness. This includes:  A yearly physical exam. This may also be called an annual well check.  Regular dental visits and eye exams.  Immunizations.  Screening for certain conditions.  Healthy lifestyle choices, such as eating a healthy diet, getting regular exercise, not using drugs or products that contain nicotine and tobacco, and limiting alcohol use. What can I expect for my preventive care visit? Physical exam Your health care provider will check your:  Height and weight. This may be used to calculate body mass index (BMI), which tells if you are at a healthy weight.  Heart rate and blood pressure.  Skin for abnormal spots. Counseling Your health care provider may ask you questions about your:  Alcohol, tobacco, and drug use.  Emotional well-being.  Home and relationship well-being.  Sexual activity.  Eating habits.  Work and work Statistician.  Method of birth control.  Menstrual cycle.  Pregnancy history. What immunizations do I need?  Influenza (flu) vaccine  This is recommended every year. Tetanus, diphtheria, and pertussis (Tdap) vaccine  You may need a Td booster every 10 years. Varicella (chickenpox) vaccine  You may need this if you have not been vaccinated. Human papillomavirus (HPV) vaccine  If recommended by your health care provider, you may need three doses over 6 months. Measles, mumps, and rubella  (MMR) vaccine  You may need at least one dose of MMR. You may also need a second dose. Meningococcal conjugate (MenACWY) vaccine  One dose is recommended if you are age 67-21 years and a first-year college student living in a residence hall, or if you have one of several medical conditions. You may also need additional booster doses. Pneumococcal conjugate (PCV13) vaccine  You may need this if you have certain conditions and were not previously vaccinated. Pneumococcal polysaccharide (PPSV23) vaccine  You may need one or two doses if you smoke cigarettes or if you have certain conditions. Hepatitis A vaccine  You may need this if you have certain conditions or if you travel or work in places where you may be exposed to hepatitis A. Hepatitis B vaccine  You may need this if you have certain conditions or if you travel or work in places where you may be exposed to hepatitis B. Haemophilus influenzae type b (Hib) vaccine  You may need this if you have certain conditions. You may receive vaccines as individual doses or as more than one vaccine together in one shot (combination vaccines). Talk with your health care provider about the risks and benefits of combination vaccines. What tests do I need?  Blood tests  Lipid and cholesterol levels. These may be checked every 5 years starting at age 48.  Hepatitis C test.  Hepatitis B test. Screening  Diabetes screening. This is done by checking your blood sugar (glucose) after you have not eaten for a while (fasting).  Sexually transmitted disease (STD) testing.  BRCA-related cancer screening. This may be done if you have a family history of breast, ovarian, tubal, or peritoneal cancers.  Pelvic exam and Pap test. This may be done every 3 years starting at age 68. Starting at age 30, this may be done every 5 years if you have a Pap test in combination with an HPV test. Talk with your health care provider about your test results, treatment  options, and if necessary, the need for more tests. Follow these instructions at home: Eating and drinking   Eat a diet that includes fresh fruits and vegetables, whole grains, lean protein, and low-fat dairy.  Take vitamin and mineral supplements as recommended by your health care provider.  Do not drink alcohol if: ? Your health care provider tells you not to drink. ? You are pregnant, may be pregnant, or are planning to become pregnant.  If you drink alcohol: ? Limit how much you have to 0-1 drink a day. ? Be aware of how much alcohol is in your drink. In the [place] one drink equals one 12 oz bottle of beer (355 mL), one 5 oz glass of wine (148 mL), or one 1 oz glass of hard liquor (44 mL). Lifestyle  Take daily care of your teeth and gums.  Stay active. Exercise for at least 30 minutes on 5 or more days each week.  Do not use any products that contain nicotine or tobacco, such as cigarettes, e-cigarettes, and chewing tobacco. If you need help quitting, ask your health care provider.  If you are sexually active, practice safe sex.  Use a condom or other form of birth control (contraception) in order to prevent pregnancy and STIs (sexually transmitted infections). If you plan to become pregnant, see your health care provider for a preconception visit. What's next?  Visit your health care provider once a year for a well check visit.  Ask your health care provider how often you should have your eyes and teeth checked.  Stay up to date on all vaccines. This information is not intended to replace advice given to you by your health care provider. Make sure you discuss any questions you have with your health care provider. Document Revised: 02/25/2018 Document Reviewed: 02/25/2018 Elsevier Patient Education  2020 Elsevier Inc.  
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
## 2019-12-27 LAB — LIPID PANEL

## 2019-12-27 LAB — HEPATIC FUNCTION PANEL

## 2019-12-27 LAB — CBC

## 2019-12-27 LAB — TSH

## 2019-12-27 LAB — FSH/LH

## 2019-12-27 LAB — GLUCOSE, RANDOM

## 2019-12-29 ENCOUNTER — Encounter: Payer: Self-pay | Admitting: General Practice

## 2019-12-29 LAB — SPECIMEN STATUS REPORT

## 2019-12-29 NOTE — Progress Notes (Signed)
They are now saying there are no longer samples available. JML

## 2019-12-29 NOTE — Progress Notes (Signed)
Why were all of her labs cancelled? JML

## 2019-12-30 ENCOUNTER — Other Ambulatory Visit: Payer: Self-pay

## 2019-12-30 ENCOUNTER — Other Ambulatory Visit: Payer: Managed Care, Other (non HMO)

## 2019-12-30 DIAGNOSIS — E7800 Pure hypercholesterolemia, unspecified: Secondary | ICD-10-CM

## 2019-12-30 DIAGNOSIS — Z01419 Encounter for gynecological examination (general) (routine) without abnormal findings: Secondary | ICD-10-CM

## 2019-12-30 DIAGNOSIS — Z6828 Body mass index (BMI) 28.0-28.9, adult: Secondary | ICD-10-CM

## 2019-12-31 LAB — HEPATIC FUNCTION PANEL
ALT: 34 IU/L — ABNORMAL HIGH (ref 0–32)
AST: 18 IU/L (ref 0–40)
Albumin: 4.5 g/dL (ref 3.8–4.8)
Alkaline Phosphatase: 93 IU/L (ref 48–121)
Bilirubin Total: 0.8 mg/dL (ref 0.0–1.2)
Bilirubin, Direct: 0.18 mg/dL (ref 0.00–0.40)
Total Protein: 6.6 g/dL (ref 6.0–8.5)

## 2019-12-31 LAB — LIPID PANEL
Chol/HDL Ratio: 4.7 ratio — ABNORMAL HIGH (ref 0.0–4.4)
Cholesterol, Total: 168 mg/dL (ref 100–199)
HDL: 36 mg/dL — ABNORMAL LOW (ref >39)
LDL Chol Calc (NIH): 114 mg/dL — ABNORMAL HIGH (ref 0–99)
Triglycerides: 99 mg/dL (ref 0–149)
VLDL Cholesterol Cal: 18 mg/dL (ref 5–40)

## 2019-12-31 LAB — CBC
Hematocrit: 43 % (ref 34.0–46.6)
Hemoglobin: 14.8 g/dL (ref 11.1–15.9)
MCH: 33.4 pg — ABNORMAL HIGH (ref 26.6–33.0)
MCHC: 34.4 g/dL (ref 31.5–35.7)
MCV: 97 fL (ref 79–97)
Platelets: 224 10*3/uL (ref 150–450)
RBC: 4.43 x10E6/uL (ref 3.77–5.28)
RDW: 12.3 % (ref 11.7–15.4)
WBC: 5.5 10*3/uL (ref 3.4–10.8)

## 2019-12-31 LAB — TSH: TSH: 1.92 u[IU]/mL (ref 0.450–4.500)

## 2019-12-31 LAB — GLUCOSE, RANDOM: Glucose: 82 mg/dL (ref 65–99)

## 2020-01-26 ENCOUNTER — Encounter: Payer: Self-pay | Admitting: Obstetrics and Gynecology

## 2020-01-30 ENCOUNTER — Encounter: Payer: Managed Care, Other (non HMO) | Admitting: Certified Nurse Midwife

## 2020-02-13 ENCOUNTER — Other Ambulatory Visit: Payer: Self-pay

## 2020-02-13 ENCOUNTER — Other Ambulatory Visit: Payer: Managed Care, Other (non HMO)

## 2020-02-13 DIAGNOSIS — Z20822 Contact with and (suspected) exposure to covid-19: Secondary | ICD-10-CM

## 2020-02-14 LAB — SARS-COV-2, NAA 2 DAY TAT

## 2020-02-14 LAB — NOVEL CORONAVIRUS, NAA: SARS-CoV-2, NAA: NOT DETECTED

## 2020-05-14 NOTE — Telephone Encounter (Signed)
Please schedule appt with [facility] to discuss hysterectomy, Dr. Valentino Saxon aware and approved. Thanks, JML

## 2020-05-18 ENCOUNTER — Encounter: Payer: Managed Care, Other (non HMO) | Admitting: Obstetrics and Gynecology

## 2020-12-24 ENCOUNTER — Ambulatory Visit (INDEPENDENT_AMBULATORY_CARE_PROVIDER_SITE_OTHER): Payer: Managed Care, Other (non HMO) | Admitting: Certified Nurse Midwife

## 2020-12-24 ENCOUNTER — Encounter: Payer: Self-pay | Admitting: Certified Nurse Midwife

## 2020-12-24 ENCOUNTER — Other Ambulatory Visit: Payer: Self-pay

## 2020-12-24 ENCOUNTER — Other Ambulatory Visit (HOSPITAL_COMMUNITY)
Admission: RE | Admit: 2020-12-24 | Discharge: 2020-12-24 | Disposition: A | Discharge status: Home / Self Care | Payer: Managed Care, Other (non HMO) | Source: Ambulatory Visit | Attending: Certified Nurse Midwife | Admitting: Certified Nurse Midwife

## 2020-12-24 VITALS — BP 124/81 | HR 90 | Resp 16 | Ht 64.0 in | Wt 166.7 lb

## 2020-12-24 DIAGNOSIS — Z008 Encounter for other general examination: Secondary | ICD-10-CM | POA: Diagnosis not present

## 2020-12-24 DIAGNOSIS — E7800 Pure hypercholesterolemia, unspecified: Secondary | ICD-10-CM | POA: Diagnosis not present

## 2020-12-24 DIAGNOSIS — Z01419 Encounter for gynecological examination (general) (routine) without abnormal findings: Secondary | ICD-10-CM

## 2020-12-24 DIAGNOSIS — E786 Lipoprotein deficiency: Secondary | ICD-10-CM

## 2020-12-24 DIAGNOSIS — Z124 Encounter for screening for malignant neoplasm of cervix: Secondary | ICD-10-CM | POA: Insufficient documentation

## 2020-12-24 DIAGNOSIS — Z1159 Encounter for screening for other viral diseases: Secondary | ICD-10-CM

## 2020-12-24 LAB — GLUCOSE, POCT (MANUAL RESULT ENTRY): POC Glucose: 89 mg/dl (ref 70–99)
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
## 2020-12-24 NOTE — Patient Instructions (Signed)
Preventive Care 21-37 Years Old, Female Preventive care refers to lifestyle choices and visits with your health care provider that can promote health and wellness. This includes: A yearly physical exam. This is also called an annual wellness visit. Regular dental and eye exams. Immunizations. Screening for certain conditions. Healthy lifestyle choices, such as: Eating a healthy diet. Getting regular exercise. Not using drugs or products that contain nicotine and tobacco. Limiting alcohol use. What can I expect for my preventive care visit? Physical exam Your health care provider may check your: Height and weight. These may be used to calculate your BMI (body mass index). BMI is a measurement that tells if you are at a healthy weight. Heart rate and blood pressure. Body temperature. Skin for abnormal spots. Counseling Your health care provider may ask you questions about your: Past medical problems. Family's medical history. Alcohol, tobacco, and drug use. Emotional well-being. Home life and relationship well-being. Sexual activity. Diet, exercise, and sleep habits. Work and work environment. Access to firearms. Method of birth control. Menstrual cycle. Pregnancy history. What immunizations do I need?  Vaccines are usually given at various ages, according to a schedule. Your health care provider will recommend vaccines for you based on your age, medicalhistory, and lifestyle or other factors, such as travel or where you work. What tests do I need?  Blood tests Lipid and cholesterol levels. These may be checked every 5 years starting at age 20. Hepatitis C test. Hepatitis B test. Screening Diabetes screening. This is done by checking your blood sugar (glucose) after you have not eaten for a while (fasting). STD (sexually transmitted disease) testing, if you are at risk. BRCA-related cancer screening. This may be done if you have a family history of breast, ovarian, tubal, or  peritoneal cancers. Pelvic exam and Pap test. This may be done every 3 years starting at age 21. Starting at age 30, this may be done every 5 years if you have a Pap test in combination with an HPV test. Talk with your health care provider about your test results, treatment options,and if necessary, the need for more tests. Follow these instructions at home: Eating and drinking  Eat a healthy diet that includes fresh fruits and vegetables, whole grains, lean protein, and low-fat dairy products. Take vitamin and mineral supplements as recommended by your health care provider. Do not drink alcohol if: Your health care provider tells you not to drink. You are pregnant, may be pregnant, or are planning to become pregnant. If you drink alcohol: Limit how much you have to 0-1 drink a day. Be aware of how much alcohol is in your drink. In the [place] one drink equals one 12 oz bottle of beer (355 mL), one 5 oz glass of wine (148 mL), or one 1 oz glass of hard liquor (44 mL).  Lifestyle Take daily care of your teeth and gums. Brush your teeth every morning and night with fluoride toothpaste. Floss one time each day. Stay active. Exercise for at least 30 minutes 5 or more days each week. Do not use any products that contain nicotine or tobacco, such as cigarettes, e-cigarettes, and chewing tobacco. If you need help quitting, ask your health care provider. Do not use drugs. If you are sexually active, practice safe sex. Use a condom or other form of protection to prevent STIs (sexually transmitted infections). If you do not wish to become pregnant, use a form of birth control. If you plan to become pregnant, see your health care   provider for a prepregnancy visit. Find healthy ways to cope with stress, such as: Meditation, yoga, or listening to music. Journaling. Talking to a trusted person. Spending time with friends and family. Safety Always wear your seat belt while driving or riding in a  vehicle. Do not drive: If you have been drinking alcohol. Do not ride with someone who has been drinking. When you are tired or distracted. While texting. Wear a helmet and other protective equipment during sports activities. If you have firearms in your house, make sure you follow all gun safety procedures. Seek help if you have been physically or sexually abused. What's next? Go to your health care provider once a year for an annual wellness visit. Ask your health care provider how often you should have your eyes and teeth checked. Stay up to date on all vaccines. This information is not intended to replace advice given to you by your health care provider. Make sure you discuss any questions you have with your healthcare provider. Document Revised: 02/12/2020 Document Reviewed: 02/25/2018 Elsevier Patient Education  2022 [employer].

## 2020-12-25 DIAGNOSIS — Z01419 Encounter for gynecological examination (general) (routine) without abnormal findings: Secondary | ICD-10-CM

## 2020-12-25 LAB — LIPID PANEL
Chol/HDL Ratio: 4.8 ratio — ABNORMAL HIGH (ref 0.0–4.4)
Cholesterol, Total: 173 mg/dL (ref 100–199)
HDL: 36 mg/dL — ABNORMAL LOW (ref >39)
LDL Chol Calc (NIH): 119 mg/dL — ABNORMAL HIGH (ref 0–99)
Triglycerides: 96 mg/dL (ref 0–149)
VLDL Cholesterol Cal: 18 mg/dL (ref 5–40)

## 2020-12-27 NOTE — Telephone Encounter (Signed)
Can you please add CMP or hepatic function to blood already collected? Thanks, JML

## 2020-12-27 NOTE — Addendum Note (Signed)
Addended by: Tommie Raymond on: 12/27/2020 09:31 AM   Modules accepted: Orders

## 2020-12-28 ENCOUNTER — Encounter: Payer: Self-pay | Admitting: Certified Nurse Midwife

## 2020-12-28 DIAGNOSIS — R7401 Elevation of levels of liver transaminase levels: Secondary | ICD-10-CM | POA: Insufficient documentation

## 2020-12-28 DIAGNOSIS — E785 Hyperlipidemia, unspecified: Secondary | ICD-10-CM | POA: Insufficient documentation

## 2020-12-28 LAB — COMPREHENSIVE METABOLIC PANEL
ALT: 42 IU/L — ABNORMAL HIGH (ref 0–32)
AST: 21 IU/L (ref 0–40)
Albumin/Globulin Ratio: 2.3 — ABNORMAL HIGH (ref 1.2–2.2)
Albumin: 4.6 g/dL (ref 3.8–4.8)
Alkaline Phosphatase: 99 IU/L (ref 44–121)
BUN/Creatinine Ratio: 13 (ref 9–23)
BUN: 9 mg/dL (ref 6–20)
Bilirubin Total: 0.5 mg/dL (ref 0.0–1.2)
CO2: 24 mmol/L (ref 20–29)
Calcium: 9.3 mg/dL (ref 8.7–10.2)
Chloride: 103 mmol/L (ref 96–106)
Creatinine, Ser: 0.68 mg/dL (ref 0.57–1.00)
Globulin, Total: 2 g/dL (ref 1.5–4.5)
Glucose: 95 mg/dL (ref 65–99)
Potassium: 4.3 mmol/L (ref 3.5–5.2)
Sodium: 143 mmol/L (ref 134–144)
Total Protein: 6.6 g/dL (ref 6.0–8.5)
eGFR: 116 mL/min/{1.73_m2} (ref >59)

## 2020-12-28 LAB — CYTOLOGY - PAP
Comment: NEGATIVE
Diagnosis: NEGATIVE
Diagnosis: REACTIVE
High risk HPV: NEGATIVE

## 2020-12-28 LAB — SPECIMEN STATUS REPORT

## 2021-02-06 ENCOUNTER — Other Ambulatory Visit: Payer: Self-pay

## 2021-02-06 ENCOUNTER — Emergency Department: Payer: Managed Care, Other (non HMO)

## 2021-02-06 ENCOUNTER — Encounter: Payer: Self-pay | Admitting: Internal Medicine

## 2021-02-06 ENCOUNTER — Inpatient Hospital Stay
Admission: EM | Admit: 2021-02-06 | Discharge: 2021-02-08 | DRG: 390 | Disposition: A | Discharge status: Home / Self Care | Payer: Managed Care, Other (non HMO) | Attending: Internal Medicine | Admitting: Internal Medicine

## 2021-02-06 DIAGNOSIS — Z91013 Allergy to seafood: Secondary | ICD-10-CM

## 2021-02-06 DIAGNOSIS — K56609 Unspecified intestinal obstruction, unspecified as to partial versus complete obstruction: Secondary | ICD-10-CM

## 2021-02-06 DIAGNOSIS — Z20822 Contact with and (suspected) exposure to covid-19: Secondary | ICD-10-CM | POA: Diagnosis present

## 2021-02-06 DIAGNOSIS — K5651 Intestinal adhesions [bands], with partial obstruction: Principal | ICD-10-CM | POA: Diagnosis present

## 2021-02-06 DIAGNOSIS — Z87410 Personal history of cervical dysplasia: Secondary | ICD-10-CM

## 2021-02-06 DIAGNOSIS — Z98891 History of uterine scar from previous surgery: Secondary | ICD-10-CM | POA: Diagnosis not present

## 2021-02-06 DIAGNOSIS — R1900 Intra-abdominal and pelvic swelling, mass and lump, unspecified site: Secondary | ICD-10-CM

## 2021-02-06 DIAGNOSIS — K56600 Partial intestinal obstruction, unspecified as to cause: Secondary | ICD-10-CM | POA: Diagnosis not present

## 2021-02-06 DIAGNOSIS — Z833 Family history of diabetes mellitus: Secondary | ICD-10-CM

## 2021-02-06 LAB — MAGNESIUM: Magnesium: 1.9 mg/dL (ref 1.7–2.4)

## 2021-02-06 LAB — CBC
HCT: 42.1 % (ref 36.0–46.0)
HCT: 39.9 % (ref 36.0–46.0)
Hemoglobin: 15.6 g/dL — ABNORMAL HIGH (ref 12.0–15.0)
Hemoglobin: 14.1 g/dL (ref 12.0–15.0)
MCH: 35.5 pg — ABNORMAL HIGH (ref 26.0–34.0)
MCH: 34.6 pg — ABNORMAL HIGH (ref 26.0–34.0)
MCHC: 37.1 g/dL — ABNORMAL HIGH (ref 30.0–36.0)
MCHC: 35.3 g/dL (ref 30.0–36.0)
MCV: 95.9 fL (ref 80.0–100.0)
MCV: 97.8 fL (ref 80.0–100.0)
Platelets: 232 10*3/uL (ref 150–400)
Platelets: 209 10*3/uL (ref 150–400)
RBC: 4.39 MIL/uL (ref 3.87–5.11)
RBC: 4.08 MIL/uL (ref 3.87–5.11)
RDW: 12.3 % (ref 11.5–15.5)
RDW: 12.3 % (ref 11.5–15.5)
WBC: 9.3 10*3/uL (ref 4.0–10.5)
WBC: 6.7 10*3/uL (ref 4.0–10.5)
nRBC: 0 % (ref 0.0–0.2)
nRBC: 0 % (ref 0.0–0.2)

## 2021-02-06 LAB — URINALYSIS, COMPLETE (UACMP) WITH MICROSCOPIC
Bacteria, UA: NONE SEEN
Bilirubin Urine: NEGATIVE
Glucose, UA: NEGATIVE mg/dL
Ketones, ur: NEGATIVE mg/dL
Nitrite: NEGATIVE
Protein, ur: NEGATIVE mg/dL
Specific Gravity, Urine: 1.006 (ref 1.005–1.030)
pH: 7 (ref 5.0–8.0)

## 2021-02-06 LAB — LACTIC ACID, PLASMA
Lactic Acid, Venous: 1.6 mmol/L (ref 0.5–1.9)
Lactic Acid, Venous: 0.9 mmol/L (ref 0.5–1.9)

## 2021-02-06 LAB — GLUCOSE, CAPILLARY: Glucose-Capillary: 98 mg/dL (ref 70–99)

## 2021-02-06 LAB — BASIC METABOLIC PANEL
Anion gap: 8 (ref 5–15)
BUN: 7 mg/dL (ref 6–20)
CO2: 25 mmol/L (ref 22–32)
Calcium: 7.9 mg/dL — ABNORMAL LOW (ref 8.9–10.3)
Chloride: 107 mmol/L (ref 98–111)
Creatinine, Ser: 0.63 mg/dL (ref 0.44–1.00)
GFR, Estimated: >60 mL/min (ref >60)
Glucose, Bld: 86 mg/dL (ref 70–99)
Potassium: 3.3 mmol/L — ABNORMAL LOW (ref 3.5–5.1)
Sodium: 140 mmol/L (ref 135–145)

## 2021-02-06 LAB — COMPREHENSIVE METABOLIC PANEL
ALT: 38 U/L (ref 0–44)
AST: 23 U/L (ref 15–41)
Albumin: 4.5 g/dL (ref 3.5–5.0)
Alkaline Phosphatase: 87 U/L (ref 38–126)
Anion gap: 6 (ref 5–15)
BUN: 9 mg/dL (ref 6–20)
CO2: 28 mmol/L (ref 22–32)
Calcium: 9.3 mg/dL (ref 8.9–10.3)
Chloride: 106 mmol/L (ref 98–111)
Creatinine, Ser: 0.62 mg/dL (ref 0.44–1.00)
GFR, Estimated: >60 mL/min (ref >60)
Glucose, Bld: 110 mg/dL — ABNORMAL HIGH (ref 70–99)
Potassium: 3.8 mmol/L (ref 3.5–5.1)
Sodium: 140 mmol/L (ref 135–145)
Total Bilirubin: 1.1 mg/dL (ref 0.3–1.2)
Total Protein: 7.7 g/dL (ref 6.5–8.1)

## 2021-02-06 LAB — HEMOGLOBIN A1C
Hgb A1c MFr Bld: 4.7 % — ABNORMAL LOW (ref 4.8–5.6)
Mean Plasma Glucose: 88.19 mg/dL

## 2021-02-06 LAB — LIPASE, BLOOD: Lipase: 23 U/L (ref 11–51)

## 2021-02-06 LAB — RESP PANEL BY RT-PCR (FLU A&B, COVID) ARPGX2
Influenza A by PCR: NEGATIVE
Influenza B by PCR: NEGATIVE
SARS Coronavirus 2 by RT PCR: NEGATIVE

## 2021-02-06 LAB — POC URINE PREG, ED: Preg Test, Ur: NEGATIVE

## 2021-02-06 LAB — HIV ANTIBODY (ROUTINE TESTING W REFLEX): HIV Screen 4th Generation wRfx: NONREACTIVE

## 2021-02-06 LAB — PHOSPHORUS: Phosphorus: 2.4 mg/dL — ABNORMAL LOW (ref 2.5–4.6)

## 2021-02-06 MED ORDER — ONDANSETRON HCL 4 MG/2ML IJ SOLN
4.0000 mg | Freq: Four times a day (QID) | INTRAMUSCULAR | Status: DC | PRN
  Administered 2021-02-07: 4 mg via INTRAVENOUS
  Filled 2021-02-06: qty 2

## 2021-02-06 MED ORDER — MORPHINE SULFATE (PF) 4 MG/ML IV SOLN
4.0000 mg | Freq: Once | INTRAVENOUS | Status: AC
  Administered 2021-02-06: 4 mg via INTRAVENOUS
  Filled 2021-02-06: qty 1

## 2021-02-06 MED ORDER — SODIUM CHLORIDE 0.9 % IV BOLUS
1000.0000 mL | Freq: Once | INTRAVENOUS | Status: AC
  Administered 2021-02-06: 1000 mL via INTRAVENOUS

## 2021-02-06 MED ORDER — SODIUM CHLORIDE 0.9 % IV SOLN: INTRAVENOUS | Status: DC

## 2021-02-06 MED ORDER — ONDANSETRON HCL 4 MG/2ML IJ SOLN
4.0000 mg | Freq: Once | INTRAMUSCULAR | Status: AC
  Administered 2021-02-06: 4 mg via INTRAVENOUS
  Filled 2021-02-06: qty 2

## 2021-02-06 MED ORDER — ENOXAPARIN SODIUM 40 MG/0.4ML IJ SOSY
40.0000 mg | PREFILLED_SYRINGE | INTRAMUSCULAR | Status: DC
  Administered 2021-02-06 – 2021-02-07 (×2): 40 mg via SUBCUTANEOUS
  Filled 2021-02-06 (×2): qty 0.4

## 2021-02-06 MED ORDER — PANTOPRAZOLE SODIUM 40 MG IV SOLR
40.0000 mg | INTRAVENOUS | Status: DC
  Administered 2021-02-06 – 2021-02-07 (×2): 40 mg via INTRAVENOUS
  Filled 2021-02-06 (×2): qty 40

## 2021-02-06 MED ORDER — ONDANSETRON 4 MG PO TBDP: 4.0000 mg | ORAL_TABLET | Freq: Once | ORAL | Status: DC | PRN

## 2021-02-06 MED ORDER — ONDANSETRON HCL 4 MG PO TABS: 4.0000 mg | ORAL_TABLET | Freq: Four times a day (QID) | ORAL | Status: DC | PRN

## 2021-02-06 MED ORDER — IOHEXOL 350 MG/ML SOLN
80.0000 mL | Freq: Once | INTRAVENOUS | Status: AC | PRN
  Administered 2021-02-06: 80 mL via INTRAVENOUS

## 2021-02-06 MED ORDER — MORPHINE SULFATE (PF) 2 MG/ML IV SOLN
2.0000 mg | INTRAVENOUS | Status: DC | PRN
Start: 2021-02-06 — End: 2021-02-08
  Administered 2021-02-06: 2 mg via INTRAVENOUS
  Filled 2021-02-06: qty 1

## 2021-02-06 MED ORDER — ALBUTEROL SULFATE (2.5 MG/3ML) 0.083% IN NEBU
2.5000 mg | INHALATION_SOLUTION | Freq: Four times a day (QID) | RESPIRATORY_TRACT | Status: DC | PRN
Start: 2021-02-06 — End: 2021-02-08

## 2021-02-06 NOTE — ED Notes (Signed)
Patient transported to CT 

## 2021-02-06 NOTE — ED Triage Notes (Signed)
Pt arrived via POV with reports of low abd pain, n and v that started around 9pm last night. Pt reports the pain feels like a cramping pain and contractions.  Pt has hx of c-section and exp. Lap.  C/o 10/10 pain, Denies any diarrhea.

## 2021-02-06 NOTE — ED Provider Notes (Signed)
[facility] Emergency Department Provider Note  ____________________________________________   Event Date/Time   First MD Initiated Contact with Patient 02/06/21 0720     (approximate)  I have reviewed the triage vital signs and the nursing notes.   HISTORY  Chief Complaint Abdominal Pain    HPI Cathy Bray is a 35 y.o. female with past medical history as below here with abdominal pain.  Patient states that for the last 24 hours or so, she has had progressively worsening aching, gnawing, generalized, but primarily lower abdominal pain.  The pain is fairly constant.  It was initially cramp-like and is now constant.  She has had associated nausea and wanted to vomit.  No diarrhea or constipation.  No fevers or chills.  No urinary symptoms.  No vaginal bleeding.  She just finished her cycle last week and it was normal.  No specific alleviating or aggravating factors.    Past Medical History:  Diagnosis Date   Dysplasia of cervix    Hematuria    History of LEEP (loop electrosurgical excision procedure) of cervix complicating pregnancy    Infertility, female    Missed ab    Nausea and vomiting during pregnancy    Ovarian cyst    LEFT- 2.4CM    S/P urgent classical cesarean section 01/08/2015   Done for malpresentation (fetal arm in vagina) at [redacted]w[redacted]d s/p PPROM     Second trimester bleeding     Patient Active Problem List   Diagnosis Date Noted   Elevated ALT measurement 12/28/2020   Dyslipidemia with elevated low density lipoprotein (LDL) cholesterol and abnormally low high density lipoprotein cholesterol 12/28/2020   Knee pain 01/30/2016   Abnormal uterine bleeding 07/24/2015   History of dysmenorrhea 05/17/2015   Status post exploratory laparotomy 02/15/2015   Post op infection 01/21/2015   S/P cesarean section 01/08/2015   Cervical dysplasia 12/15/2014   History of loop electrical excision procedure (LEEP) 12/15/2014    Past Surgical History:   Procedure Laterality Date   CESAREAN SECTION N/A 01/08/2015   Procedure: CESAREAN SECTION;  Surgeon: Tereso Newcomer, MD;  Location: [facility] ORS;  Service: Obstetrics;  Laterality: N/A;  classical on uterus    LAPAROTOMY N/A 01/23/2015   Procedure: EXPLORATORY LAPAROTOMY;  Surgeon: Herold Harms, MD;  Location: [facility] ORS;  Service: Gynecology;  Laterality: N/A;   LEEP  2012   [facility]    Prior to Admission medications   Not on File    Allergies Shrimp extract allergy skin test and Shrimp [shellfish allergy]  Family History  Problem Relation Age of Onset   Diabetes Mother    Heart disease Neg Hx    Breast cancer Neg Hx    Colon cancer Neg Hx    Ovarian cancer Neg Hx     Social History Social History   Tobacco Use   Smoking status: Never   Smokeless tobacco: Never  Vaping Use   Vaping Use: Never used  Substance Use Topics   Alcohol use: No   Drug use: No    Review of Systems  Review of Systems  Constitutional:  Positive for fatigue. Negative for fever.  HENT:  Negative for congestion and sore throat.   Eyes:  Negative for visual disturbance.  Respiratory:  Negative for cough and shortness of breath.   Cardiovascular:  Negative for chest pain.  Gastrointestinal:  Positive for abdominal pain and nausea. Negative for diarrhea and vomiting.  Genitourinary:  Negative for flank pain.  Musculoskeletal:  Negative for back pain and neck pain.  Skin:  Negative for rash and wound.  Neurological:  Negative for weakness.  All other systems reviewed and are negative.   ____________________________________________  PHYSICAL EXAM:      VITAL SIGNS: ED Triage Vitals  Enc Vitals Group     BP 02/06/21 0547 (!) 149/101     Pulse Rate 02/06/21 0547 95     Resp 02/06/21 0547 16     Temp 02/06/21 0547 98.9 F (37.2 C)     Temp Source 02/06/21 0547 Oral     SpO2 02/06/21 0547 98 %     Weight 02/06/21 0546 166 lb (75.3 kg)     Height 02/06/21 0546 5\' 4"  (1.626 m)     Head  Circumference --      Peak Flow --      Pain Score 02/06/21 0550 10     Pain Loc --      Pain Edu? --      Excl. in GC? --      Physical Exam Vitals and nursing note reviewed.  Constitutional:      General: She is not in acute distress.    Appearance: She is well-developed.  HENT:     Head: Normocephalic and atraumatic.  Eyes:     Conjunctiva/sclera: Conjunctivae normal.  Cardiovascular:     Rate and Rhythm: Normal rate and regular rhythm.     Heart sounds: Normal heart sounds. No murmur heard.   No friction rub.  Pulmonary:     Effort: Pulmonary effort is normal. No respiratory distress.     Breath sounds: Normal breath sounds. No wheezing or rales.  Abdominal:     General: There is no distension.     Palpations: Abdomen is soft.     Tenderness: There is abdominal tenderness in the right lower quadrant, suprapubic area and left lower quadrant. There is no guarding or rebound. Negative signs include Murphy's sign.  Musculoskeletal:     Cervical back: Neck supple.  Skin:    General: Skin is warm.     Capillary Refill: Capillary refill takes less than 2 seconds.  Neurological:     Mental Status: She is alert and oriented to person, place, and time.     Motor: No abnormal muscle tone.      ____________________________________________   LABS (all labs ordered are listed, but only abnormal results are displayed)  Labs Reviewed  COMPREHENSIVE METABOLIC PANEL - Abnormal; Notable for the following components:      Result Value   Glucose, Bld 110 (*)    All other components within normal limits  CBC - Abnormal; Notable for the following components:   Hemoglobin 15.6 (*)    MCH 35.5 (*)    MCHC 37.1 (*)    All other components within normal limits  URINALYSIS, COMPLETE (UACMP) WITH MICROSCOPIC - Abnormal; Notable for the following components:   Color, Urine STRAW (*)    APPearance CLEAR (*)    Hgb urine dipstick LARGE (*)    Leukocytes,Ua SMALL (*)    All other  components within normal limits  RESP PANEL BY RT-PCR (FLU A&B, COVID) [id number]  LIPASE, BLOOD  POC URINE PREG, ED    ____________________________________________  EKG:  ________________________________________  RADIOLOGY All imaging, including plain films, CT scans, and ultrasounds, independently reviewed by me, and interpretations confirmed via formal radiology reads.  ED MD interpretation:   CT abdomen/pelvis: Small bowel obstruction with transition point secondary to 1.9 cm  partially calcified mass in the small bowel mesentery  Official radiology report(s): CT ABDOMEN PELVIS W CONTRAST  Result Date: 02/06/2021 CLINICAL DATA:  Right lower quadrant abdominal pain with nausea and vomiting. EXAM: CT ABDOMEN AND PELVIS WITH CONTRAST TECHNIQUE: Multidetector CT imaging of the abdomen and pelvis was performed using the standard protocol following bolus administration of intravenous contrast. CONTRAST:  60mL OMNIPAQUE IOHEXOL 350 MG/ML SOLN COMPARISON:  CT abdomen pelvis 07/02/2017 FINDINGS: Lower chest: No acute abnormality. Hepatobiliary: No focal liver abnormality is seen. No gallstones, gallbladder wall thickening, or biliary dilatation. Pancreas: Unremarkable. No pancreatic ductal dilatation or surrounding inflammatory changes. Spleen: Normal in size without focal abnormality. Adrenals/Urinary Tract: Adrenal glands are unremarkable. Kidneys are normal, without renal calculi, focal lesion, or hydronephrosis. Bladder is unremarkable. Stomach/Bowel: Within the central mesentery of the distal small bowel, there is a fat containing, partially calcified mass measuring 1.9 x 1.5 x 1.9 cm (series 2, image 68; series 5, image 40). There is focal angulation and tethering of the adjacent small bowel loops. A mid small bowel loop is dilated up to 3 cm in diameter with small bowel feces sign immediately proximal to a focal narrowing adjacent to the mesenteric mass (series 2, images 61, 72). The small bowel  immediately distal to the narrowing demonstrates thickening (series 5, image 32). No enhancing mass is visualized within the small bowel lumen. Colon is normal in caliber with mild fecal burden. Vascular/Lymphatic: No significant vascular findings are present. No enlarged abdominal or pelvic lymph nodes. Reproductive: Bilateral simple appearing ovarian cysts measuring up to 2.5 cm in the left ovary. Other: No abdominal wall hernia or abnormality. No abdominopelvic ascites. Musculoskeletal: No acute or significant osseous findings. Mild multilevel degenerative changes in the visualized distal thoracic spine. 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 Given prior history of exploratory laparotomy, differential includes sclerosing mesenteritis. Carcinoid tumor with mesenteric metastasis is also considered, though a primary carcinoid tumor is not definitely seen. Other considerations would include desmoid tumor. ok Electronically Signed   By: Sherron Ales MD   On: 02/06/2021 09:40    ____________________________________________  PROCEDURES   Procedure(s) performed (including Critical Care):  .1-3 Lead EKG Interpretation  Date/Time: 02/06/2021 10:25 AM Performed by: Shaune Pollack, MD Authorized by: Shaune Pollack, MD     Interpretation: normal     ECG rate:  90-100   ECG rate assessment: normal     Rhythm: sinus rhythm     Ectopy: none     Conduction: normal   Comments:     Indication: weakness  ____________________________________________  INITIAL IMPRESSION / MDM / ASSESSMENT AND PLAN / ED COURSE  As part of my medical decision making, I reviewed the following data within the electronic MEDICAL RECORD NUMBER Nursing notes reviewed and incorporated, Old chart reviewed, Notes from prior ED visits, and [place] Controlled Substance Database       *Aliani Caccavale  was evaluated in Emergency Department on 02/06/2021 for the symptoms described in the history of present illness. She was evaluated in the context of the global COVID-19 pandemic, which necessitated consideration that the patient might be at risk for infection with the SARS-CoV-2 virus that causes COVID-19. Institutional protocols and algorithms that pertain to the evaluation of patients at risk for COVID-19 are in a state of rapid change based on information released by regulatory bodies including the CDC and federal and state  organizations. These policies and algorithms were followed during the patient's care in the ED.  Some ED evaluations and interventions may be delayed as a result of limited staffing during the pandemic.*     Medical Decision Making: 35 year old female here with lower abdominal pain, nausea, and vomiting.  CT imaging reviewed, shows partial small bowel obstruction with possible mass in the mesentery.  I suspect this is related to her history of complex hysterectomy requiring repeat surgery, though carcinoid is on the differential.  No definitive mass seen on CT.  CBC without significant leukocytosis.  CMP unremarkable with normal LFTs and renal function.  UA largely unremarkable.  UPT negative.  Will start fluids, n.p.o., admit to medicine.  General surgery consulted, Dr. Claudine Moutonodenberg, who agrees with admission to medicine.  ____________________________________________  FINAL CLINICAL IMPRESSION(S) / ED DIAGNOSES  Final diagnoses:  Small bowel obstruction (HCC)     MEDICATIONS GIVEN DURING THIS VISIT:  Medications  ondansetron (ZOFRAN-ODT) disintegrating tablet 4 mg (has no administration in time range)  sodium chloride 0.9 % bolus 1,000 mL (1,000 mLs Intravenous New Bag/Given 02/06/21 0826)  morphine 4 MG/ML injection 4 mg (4 mg Intravenous Given 02/06/21 0826)  ondansetron (ZOFRAN) injection 4 mg (4 mg Intravenous Given 02/06/21 0825)  iohexol (OMNIPAQUE) 350 MG/ML injection  80 mL (80 mLs Intravenous Contrast Given 02/06/21 0815)  morphine 4 MG/ML injection 4 mg (4 mg Intravenous Given 02/06/21 1015)     ED Discharge Orders     None        Note:  This document was prepared using Dragon voice recognition software and may include unintentional dictation errors.   Shaune PollackIsaacs, Jaela Yepez, MD 02/06/21 1135

## 2021-02-06 NOTE — H&P (Signed)
History and Physical    Cathy Bray OMV:[id number] DOB: 20-Jan-1986 DOA: 02/06/2021  PCP: Patient, No Pcp Per (Inactive)  Patient coming from: home  I have personally briefly reviewed patient's old medical records in [facility]  Chief Complaint:  abdominal pain   HPI: Cathy Bray is a 35 y.o. female without significant past medical history who presents with n/v low abdominal pain started last pm hours PTA to ED. She noted that pain is diffuse but worse in lower abdomen below umbilicus.Patient describes pain as aching/cramping and sharp. She notes associated n/v and progression of symptoms. She states she used pepto and chamomile tea w/o improvement.She states she has had no fever/chills/ sob/ chest /pain/prior to onset no diarrhea or constipation. She notes she has history of C-section followed by xlap due to complication of infection 6 years go, but no other abdominal surgery.  In ed on evaluation patient found to have psbo with transition point with concern for possible associated mass. Surgery was consulted who recommended medicine admission with surgical consult. Patient currently states feels improved no pain  just notes mild tenderness after being medicated with morphine. She notes no further n/v  and states she is passing gas.  ED Course: Afeb, bp 149/101, sat 98% on ra  Labs:9.3, hgb 15.6, plt 232 UA large blood, with 6-19 wbc Lipase 23 NA:140, K 3.8, cl 106, glu 110 CT abd: IMPRESSION: Partial small bowel obstruction with focal tethering and angulation at the transition point secondary to a 1.9 cm partially calcified mass in the small bowel mesentery. Given prior history of exploratory laparotomy, differential includes sclerosing mesenteritis. Carcinoid tumor with mesenteric metastasis is also considered, though a primary carcinoid tumor is not definitely seen. Other considerations would include desmoid tumor.   Review of Systems: As per HPI otherwise 10 point review of  systems negative.   Past Medical History:  Diagnosis Date   Dysplasia of cervix    Hematuria    History of LEEP (loop electrosurgical excision procedure) of cervix complicating pregnancy    Infertility, female    Missed ab    Nausea and vomiting during pregnancy    Ovarian cyst    LEFT- 2.4CM    S/P urgent classical cesarean section 01/08/2015   Done for malpresentation (fetal arm in vagina) at [redacted]w[redacted]d s/p PPROM     Second trimester bleeding     Past Surgical History:  Procedure Laterality Date   CESAREAN SECTION N/A 01/08/2015   Procedure: CESAREAN SECTION;  Surgeon: Tereso Newcomer, MD;  Location: [facility];  Service: Obstetrics;  Laterality: N/A;  classical on uterus    LAPAROTOMY N/A 01/23/2015   Procedure: EXPLORATORY LAPAROTOMY;  Surgeon: Herold Harms, MD;  Location: [facility] ORS;  Service: Gynecology;  Laterality: N/A;   LEEP  2012   [facility]     reports that she has never smoked. She has never used smokeless tobacco. She reports that she does not drink alcohol and does not use drugs.  Allergies  Allergen Reactions   Shrimp Extract Allergy Skin Test    Shrimp [Shellfish Allergy] Hives    Family History  Problem Relation Age of Onset   Diabetes Mother    Heart disease Neg Hx    Breast cancer Neg Hx    Colon cancer Neg Hx    Ovarian cancer Neg Hx     Prior to Admission medications   Not on File    Physical Exam: Vitals:   02/06/21 0547 02/06/21 0857 02/06/21 1222 02/06/21  1223  BP: (!) 149/101 136/78 133/77 133/77  Pulse: 95 97 98 98  Resp: 16 18  20   Temp: 98.9 F (37.2 C)     TempSrc: Oral     SpO2: 98% 98% 99% 99%  Weight:      Height:         Vitals:   02/06/21 0547 02/06/21 0857 02/06/21 1222 02/06/21 1223  BP: (!) 149/101 136/78 133/77 133/77  Pulse: 95 97 98 98  Resp: 16 18  20   Temp: 98.9 F (37.2 C)     TempSrc: Oral     SpO2: 98% 98% 99% 99%  Weight:      Height:      Constitutional: NAD, calm, comfortable Eyes: PERRL, lids and  conjunctivae normal ENMT: Mucous membranes are moist. Posterior pharynx clear of any exudate or lesions.Normal dentition.  Neck: normal, supple, no masses, no thyromegaly Respiratory: clear to auscultation bilaterally, no wheezing, no crackles. Normal respiratory effort. No accessory muscle use.  Cardiovascular: Regular rate and rhythm, no murmurs / rubs / gallops. No extremity edema. 2+ pedal pulses.ds  Abdomen: mild tenderness below umbilicus, no masses palpated. No hepatosplenomegaly. Bowel sounds positive. Not distended ,soft  Musculoskeletal: no clubbing / cyanosis. No joint deformity upper and lower extremities. Good ROM, no contractures. Normal muscle tone.  Skin: no rashes, lesions, ulcers. No induration Neurologic: CN 2-12 grossly intact. Sensation intact,Strength 5/5 in all 4.  Psychiatric: Normal judgment and insight. Alert and oriented x 3. Normal mood.    Labs on Admission: I have personally reviewed following labs and imaging studies  CBC: Recent Labs  Lab 02/06/21 0550  WBC 9.3  HGB 15.6*  HCT 42.1  MCV 95.9  PLT 232   Basic Metabolic Panel: Recent Labs  Lab 02/06/21 0550  NA 140  K 3.8  CL 106  CO2 28  GLUCOSE 110*  BUN 9  CREATININE 0.62  CALCIUM 9.3   GFR: Estimated Creatinine Clearance: 96.5 mL/min (by C-G formula based on SCr of 0.62 mg/dL). Liver Function Tests: Recent Labs  Lab 02/06/21 0550  AST 23  ALT 38  ALKPHOS 87  BILITOT 1.1  PROT 7.7  ALBUMIN 4.5   Recent Labs  Lab 02/06/21 0550  LIPASE 23   No results for input(s): AMMONIA in the last 168 hours. Coagulation Profile: No results for input(s): INR, PROTIME in the last 168 hours. Cardiac Enzymes: No results for input(s): CKTOTAL, CKMB, CKMBINDEX, TROPONINI in the last 168 hours. BNP (last 3 results) No results for input(s): PROBNP in the last 8760 hours. HbA1C: No results for input(s): HGBA1C in the last 72 hours. CBG: No results for input(s): GLUCAP in the last 168  hours. Lipid Profile: No results for input(s): CHOL, HDL, LDLCALC, TRIG, CHOLHDL, LDLDIRECT in the last 72 hours. Thyroid Function Tests: No results for input(s): TSH, T4TOTAL, FREET4, T3FREE, THYROIDAB in the last 72 hours. Anemia Panel: No results for input(s): VITAMINB12, FOLATE, FERRITIN, TIBC, IRON, RETICCTPCT in the last 72 hours. Urine analysis:    Component Value Date/Time   COLORURINE STRAW (A) 02/06/2021 0550   APPEARANCEUR CLEAR (A) 02/06/2021 0550   APPEARANCEUR Hazy 10/28/2014 1958   LABSPEC 1.006 02/06/2021 0550   LABSPEC 1.012 10/28/2014 1958   PHURINE 7.0 02/06/2021 0550   GLUCOSEU NEGATIVE 02/06/2021 0550   GLUCOSEU Negative 10/28/2014 1958   HGBUR LARGE (A) 02/06/2021 0550   BILIRUBINUR NEGATIVE 02/06/2021 0550   BILIRUBINUR Negative 04/02/2018 0944   BILIRUBINUR Negative 10/28/2014 1958   KETONESUR NEGATIVE  02/06/2021 0550   PROTEINUR NEGATIVE 02/06/2021 0550   UROBILINOGEN 1.0 04/02/2018 0944   UROBILINOGEN 1.0 12/31/2014 0130   NITRITE NEGATIVE 02/06/2021 0550   LEUKOCYTESUR SMALL (A) 02/06/2021 0550   LEUKOCYTESUR Trace 10/28/2014 1958    Radiological Exams on Admission: CT ABDOMEN PELVIS W CONTRAST  Result Date: 02/06/2021 CLINICAL DATA:  Right lower quadrant abdominal pain with nausea and vomiting. EXAM: CT ABDOMEN AND PELVIS WITH CONTRAST TECHNIQUE: Multidetector CT imaging of the abdomen and pelvis was performed using the standard protocol following bolus administration of intravenous contrast. CONTRAST:  43mL OMNIPAQUE IOHEXOL 350 MG/ML SOLN COMPARISON:  CT abdomen pelvis 07/02/2017 FINDINGS: Lower chest: No acute abnormality. Hepatobiliary: No focal liver abnormality is seen. No gallstones, gallbladder wall thickening, or biliary dilatation. Pancreas: Unremarkable. No pancreatic ductal dilatation or surrounding inflammatory changes. Spleen: Normal in size without focal abnormality. Adrenals/Urinary Tract: Adrenal glands are unremarkable. Kidneys are  normal, without renal calculi, focal lesion, or hydronephrosis. Bladder is unremarkable. Stomach/Bowel: Within the central mesentery of the distal small bowel, there is a fat containing, partially calcified mass measuring 1.9 x 1.5 x 1.9 cm (series 2, image 68; series 5, image 40). There is focal angulation and tethering of the adjacent small bowel loops. A mid small bowel loop is dilated up to 3 cm in diameter with small bowel feces sign immediately proximal to a focal narrowing adjacent to the mesenteric mass (series 2, images 61, 72). The small bowel immediately distal to the narrowing demonstrates thickening (series 5, image 32). No enhancing mass is visualized within the small bowel lumen. Colon is normal in caliber with mild fecal burden. Vascular/Lymphatic: No significant vascular findings are present. No enlarged abdominal or pelvic lymph nodes. Reproductive: Bilateral simple appearing ovarian cysts measuring up to 2.5 cm in the left ovary. Other: No abdominal wall hernia or abnormality. No abdominopelvic ascites. Musculoskeletal: No acute or significant osseous findings. Mild multilevel degenerative changes in the visualized distal thoracic spine. 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 Given prior history of exploratory laparotomy, differential includes sclerosing mesenteritis. Carcinoid tumor with mesenteric metastasis is also considered, though a primary carcinoid tumor is not definitely seen. Other considerations would include desmoid tumor. ok Electronically Signed   By: Sherron Ales MD   On: 02/06/2021 09:40    EKG: Independently reviewed. nsr  Assessment/Plan  Psbo due to adhesions   -hx c-section followed by xlap  -npo, no need for ng currently patient w/o recurrent n/v  -await final surgical recs  -check lactic acid  -ivfs supportive with pain  medicine and antiemetics   Concern for possible associated mass -surgery to leave final recs  DVT prophylaxis: lwmh Code Status: full Family Communication:  none at bedside  Disposition Plan:  patient  expected to be admitted greater than 2 midnights Consults called: surgery Dr Toula Moos  Admission status: inpatient    Lurline Del MD [facility]   If 7PM-7AM, please contact night-coverage [email or web address] Password [email or web address]  02/06/2021, 1:44 PM

## 2021-02-06 NOTE — ED Notes (Signed)
Patient is resting comfortably in room, just gave pt an update on plan of care. VSS. NAD. Will continue to monitor for changes.

## 2021-02-06 NOTE — Consult Note (Addendum)
[facility] SURGICAL CONSULTATION NOTE (initial) - cpt: [id number]: [id number])   HISTORY OF PRESENT ILLNESS (HPI):  35 y.o. female presented to [facility] ED today for evaluation of diffuse lower abdominal pain with associated nausea and vomiting. Patient reports acute onset of lower abdominal pain yesterday, without associated bloating or cramping or abdominal distention.  She reports vomiting her last meal which was yesterday evening.  She resents today with persistence of the nausea and associated lower abdominal pain.  She admits that she has had no passage of flatus since yesterday, her bowel movements yesterday and prior were all normal. She had a return to the OR following a cesarean section, she had a significant fascial/abdominal cavity abscess which was drained by open technique and a complex abdominal wall, repair.  Surgery is consulted by ED physician Dr. Sheria Langameron in this context for evaluation and management of abnormal abdominal CT, partial small bowel obstruction.  PAST MEDICAL HISTORY (PMH):  Past Medical History:  Diagnosis Date   Dysplasia of cervix    Hematuria    History of LEEP (loop electrosurgical excision procedure) of cervix complicating pregnancy    Infertility, female    Missed ab    Nausea and vomiting during pregnancy    Ovarian cyst    LEFT- 2.4CM    S/P urgent classical cesarean section 01/08/2015   Done for malpresentation (fetal arm in vagina) at 2549w2d s/p PPROM     Second trimester bleeding      PAST SURGICAL HISTORY (PSH):  Past Surgical History:  Procedure Laterality Date   CESAREAN SECTION N/A 01/08/2015   Procedure: CESAREAN SECTION;  Surgeon: Tereso NewcomerUgonna A Anyanwu, MD;  Location: [facility];  Service: Obstetrics;  Laterality: N/A;  classical on uterus    LAPAROTOMY N/A 01/23/2015   Procedure: EXPLORATORY LAPAROTOMY;  Surgeon: Herold HarmsMartin A Defrancesco, MD;  Location: [facility] ORS;  Service: Gynecology;  Laterality: N/A;   LEEP  2012   [facility]     MEDICATIONS:  Prior to  Admission medications   Medication Sig Start Date End Date Taking? Authorizing Provider  fluticasone (FLONASE) 50 MCG/ACT nasal spray Place 1-2 sprays into both nostrils daily as needed for allergies or rhinitis.   Yes [provider]     ALLERGIES:  Allergies  Allergen Reactions   Shrimp Extract Allergy Skin Test    Shrimp [Shellfish Allergy] Hives     SOCIAL HISTORY:  Social History   Socioeconomic History   Marital status: Married    Spouse name: Not on file   Number of children: Not on file   Years of education: Not on file   Highest education level: Not on file  Occupational History   Not on file  Tobacco Use   Smoking status: Never   Smokeless tobacco: Never  Vaping Use   Vaping Use: Never used  Substance and Sexual Activity   Alcohol use: No   Drug use: No   Sexual activity: Yes    Birth control/protection: None  Other Topics Concern   Not on file  Social History Narrative   Not on file   Social Determinants of Health   Financial Resource Strain: Not on file  Food Insecurity: Not on file  Transportation Needs: Not on file  Physical Activity: Not on file  Stress: Not on file  Social Connections: Not on file  Intimate Partner Violence: Not on file     FAMILY HISTORY:  Family History  Problem Relation Age of Onset   Diabetes Mother    Heart  disease Neg Hx    Breast cancer Neg Hx    Colon cancer Neg Hx    Ovarian cancer Neg Hx       REVIEW OF SYSTEMS:  Review of Systems  Constitutional:  Positive for malaise/fatigue. Negative for fever.  HENT:  Negative for congestion and sore throat.   Eyes:  Negative for blurred vision.  Respiratory:  Negative for cough and shortness of breath.   Cardiovascular:  Negative for chest pain.  Gastrointestinal:  Positive for abdominal pain, nausea and vomiting. Negative for blood in stool, constipation and diarrhea.  Genitourinary:  Negative for flank pain.  Musculoskeletal:  Negative for back pain and  neck pain.  Neurological:  Negative for weakness.  Psychiatric/Behavioral: Negative.     VITAL SIGNS:  Temp:  [98.9 F (37.2 C)] 98.9 F (37.2 C) (08/10 0547) Pulse Rate:  [95-98] 98 (08/10 1223) Resp:  [16-20] 20 (08/10 1223) BP: (133-149)/(77-101) 133/77 (08/10 1223) SpO2:  [98 %-99 %] 99 % (08/10 1223) Weight:  [75.3 kg] 75.3 kg (08/10 0546)     Height: 5\' 4"  (162.6 cm) Weight: 75.3 kg BMI (Calculated): 28.48   INTAKE/OUTPUT:  No intake/output data recorded.  PHYSICAL EXAM:  Physical Exam Blood pressure 133/77, pulse 98, temperature 98.9 F (37.2 C), temperature source Oral, resp. rate 20, height 5\' 4"  (1.626 m), weight 75.3 kg, last menstrual period 01/31/2021, SpO2 99 %. Last Weight  Most recent update: 02/06/2021  5:46 AM    Weight  75.3 kg (166 lb)             CONSTITUTIONAL: Well developed, and nourished, appropriately responsive and aware without distress.   EYES: Sclera non-icteric.   EARS, NOSE, MOUTH AND THROAT: Mask worn.   The oropharynx is clear. Oral mucosa is pink and moist.    Hearing is intact to voice.  NECK: Trachea is midline, and there is no jugular venous distension.  LYMPH NODES:  Lymph nodes in the neck are not enlarged. RESPIRATORY:  Lungs are clear, and breath sounds are equal bilaterally. Normal respiratory effort without pathologic use of accessory muscles. CARDIOVASCULAR: Heart is regular in rate and rhythm. GI: The abdomen is mildly tender to the lower abdomen, nonspecific without guarding or rebound.  Otherwise soft, nontender, and nondistended. There were no palpable masses. I did not appreciate hepatosplenomegaly. There were normal bowel sounds. MUSCULOSKELETAL:  Symmetrical muscle tone appreciated in all four extremities.    SKIN: Skin turgor is normal. No pathologic skin lesions appreciated.  NEUROLOGIC:  Motor and sensation appear grossly normal.  Cranial nerves are grossly without defect. PSYCH:  Alert and oriented to person, place and  time. Affect is appropriate for situation.  Data Reviewed I have personally reviewed what is currently available of the patient's imaging, recent labs and medical records.    Labs:  CBC Latest Ref Rng & Units 02/06/2021 12/30/2019 12/23/2019  WBC 4.0 - 10.5 K/uL 9.3 5.5 CANCELED  Hemoglobin 12.0 - 15.0 g/dL 15.6(H) 14.8 CANCELED  Hematocrit 36.0 - 46.0 % 42.1 43.0 CANCELED  Platelets 150 - 400 K/uL 232 224 CANCELED   CMP Latest Ref Rng & Units 02/06/2021 12/24/2020 12/30/2019  Glucose 70 - 99 mg/dL 12/26/2020) 95 82  BUN 6 - 20 mg/dL 9 9 -  Creatinine 03/01/2020 - 1.00 mg/dL 161(W 9.60 -  Sodium 4.54 - 145 mmol/L 140 143 -  Potassium 3.5 - 5.1 mmol/L 3.8 4.3 -  Chloride 98 - 111 mmol/L 106 103 -  CO2 22 - 32 mmol/L  28 24 -  Calcium 8.9 - 10.3 mg/dL 9.3 9.3 -  Total Protein 6.5 - 8.1 g/dL 7.7 6.6 6.6  Total Bilirubin 0.3 - 1.2 mg/dL 1.1 0.5 0.8  Alkaline Phos 38 - 126 U/L 87 99 93  AST 15 - 41 U/L 23 21 18   ALT 0 - 44 U/L 38 42(H) 34(H)     Imaging studies:   Last 24 hrs: CT ABDOMEN PELVIS W CONTRAST  Result Date: 02/06/2021 CLINICAL DATA:  Right lower quadrant abdominal pain with nausea and vomiting. EXAM: CT ABDOMEN AND PELVIS WITH CONTRAST TECHNIQUE: Multidetector CT imaging of the abdomen and pelvis was performed using the standard protocol following bolus administration of intravenous contrast. CONTRAST:  33mL OMNIPAQUE IOHEXOL 350 MG/ML SOLN COMPARISON:  CT abdomen pelvis 07/02/2017 FINDINGS: Lower chest: No acute abnormality. Hepatobiliary: No focal liver abnormality is seen. No gallstones, gallbladder wall thickening, or biliary dilatation. Pancreas: Unremarkable. No pancreatic ductal dilatation or surrounding inflammatory changes. Spleen: Normal in size without focal abnormality. Adrenals/Urinary Tract: Adrenal glands are unremarkable. Kidneys are normal, without renal calculi, focal lesion, or hydronephrosis. Bladder is unremarkable. Stomach/Bowel: Within the central mesentery of the distal  small bowel, there is a fat containing, partially calcified mass measuring 1.9 x 1.5 x 1.9 cm (series 2, image 68; series 5, image 40). There is focal angulation and tethering of the adjacent small bowel loops. A mid small bowel loop is dilated up to 3 cm in diameter with small bowel feces sign immediately proximal to a focal narrowing adjacent to the mesenteric mass (series 2, images 61, 72). The small bowel immediately distal to the narrowing demonstrates thickening (series 5, image 32). No enhancing mass is visualized within the small bowel lumen. Colon is normal in caliber with mild fecal burden. Vascular/Lymphatic: No significant vascular findings are present. No enlarged abdominal or pelvic lymph nodes. Reproductive: Bilateral simple appearing ovarian cysts measuring up to 2.5 cm in the left ovary. Other: No abdominal wall hernia or abnormality. No abdominopelvic ascites. Musculoskeletal: No acute or significant osseous findings. Mild multilevel degenerative changes in the visualized distal thoracic spine. 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 Given prior history of exploratory laparotomy, differential includes sclerosing mesenteritis. Carcinoid tumor with mesenteric metastasis is also considered, though a primary carcinoid tumor is not definitely seen. Other considerations would include desmoid tumor. ok Electronically Signed   By: 08/30/2017 MD   On: 02/06/2021 09:40     Assessment/Plan:  35 y.o. female with above CT scan findings, complicated by pertinent comorbidities including: Question presence of nearly the same/similar mass "Within the central mesentery of the distal small bowel, there is a fat containing, partially calcified mass measuring 1.9 x 1.5 x 1.9 cm (series 2, image 68; series 5, image 40)."  Noted in these images present in the 2019 images.    Patient Active Problem List   Diagnosis Date Noted   Small bowel obstruction (HCC) 02/06/2021   Elevated ALT measurement 12/28/2020   Dyslipidemia with elevated low density lipoprotein (LDL) cholesterol and abnormally low high density lipoprotein cholesterol 12/28/2020   Knee pain 01/30/2016   Abnormal uterine bleeding 07/24/2015   History of dysmenorrhea 05/17/2015   Status post exploratory laparotomy 02/15/2015   Post op infection 01/21/2015   S/P cesarean section 01/08/2015   Cervical dysplasia 12/15/2014   History of loop electrical excision procedure (LEEP) 12/15/2014    -  N.p.o. for now, hopefully nasogastric decompression will not be necessary.  This was discussed with the patient should her nausea be poorly controlled with antiemetics.  -Serial exams, repeat generalized abdominal imaging in a.m. with KUB.  -Consider small bowel follow-through with water-soluble contrast  - DVT prophylaxis  All of the above findings and recommendations were discussed with the patient and  family(if present), and all of patient's and present family's questions were answered to their expressed satisfaction.  Thank you for the opportunity to participate in this patient's care.  We will follow with you.  -- Campbell Lerner, M.D., FACS 02/06/2021, 3:11 PM

## 2021-02-07 ENCOUNTER — Inpatient Hospital Stay: Payer: Managed Care, Other (non HMO)

## 2021-02-07 LAB — GLUCOSE, CAPILLARY
Glucose-Capillary: 94 mg/dL (ref 70–99)
Glucose-Capillary: 87 mg/dL (ref 70–99)

## 2021-02-07 LAB — BASIC METABOLIC PANEL
Anion gap: 7 (ref 5–15)
BUN: 7 mg/dL (ref 6–20)
CO2: 25 mmol/L (ref 22–32)
Calcium: 8.3 mg/dL — ABNORMAL LOW (ref 8.9–10.3)
Chloride: 107 mmol/L (ref 98–111)
Creatinine, Ser: 0.56 mg/dL (ref 0.44–1.00)
GFR, Estimated: >60 mL/min (ref >60)
Glucose, Bld: 98 mg/dL (ref 70–99)
Potassium: 3.6 mmol/L (ref 3.5–5.1)
Sodium: 139 mmol/L (ref 135–145)

## 2021-02-07 LAB — CBC
HCT: 39.4 % (ref 36.0–46.0)
Hemoglobin: 13.7 g/dL (ref 12.0–15.0)
MCH: 33.3 pg (ref 26.0–34.0)
MCHC: 34.8 g/dL (ref 30.0–36.0)
MCV: 95.6 fL (ref 80.0–100.0)
Platelets: 213 10*3/uL (ref 150–400)
RBC: 4.12 MIL/uL (ref 3.87–5.11)
RDW: 12.3 % (ref 11.5–15.5)
WBC: 7 10*3/uL (ref 4.0–10.5)
nRBC: 0 % (ref 0.0–0.2)

## 2021-02-07 NOTE — Progress Notes (Signed)
Patient had one episode of vomit. Stated it wasn't much and it was pink in color. Administered PRN medication and will continue to monitor.

## 2021-02-07 NOTE — Progress Notes (Signed)
[facility] SURGICAL PROGRESS NOTE (cpt [phone])  Hospital Day(s): 1.   Interval History: Patient seen and examined, no acute events or new complaints overnight. Patient reports she feels much better this morning compared to presentation. She states she has some abdominal soreness but again this is much improved. A very small amount of emesis overnight, but this is resolved. No fever, chills, nausea. She remains without leukocytosis and BMP is reassuring. She did have KUB this which showed gas throughout the colon.   Review of Systems:  Constitutional: denies fever, chills  HEENT: denies cough or congestion  Respiratory: denies any shortness of breath  Cardiovascular: denies chest pain or palpitations  Gastrointestinal: + abdominal pain (improved), denied N/V, or diarrhea/and bowel function as per interval history Genitourinary: denies burning with urination or urinary frequency  Vital signs in last 24 hours: [min-max] current  Temp:  [97.9 F (36.6 C)-98.6 F (37 C)] 98.6 F (37 C) (08/11 0735) Pulse Rate:  [55-98] 55 (08/11 0735) Resp:  [16-20] 18 (08/11 0735) BP: (120-136)/(74-80) 120/78 (08/11 0735) SpO2:  [98 %-100 %] 99 % (08/11 0735)     Height: 5\' 4"  (162.6 cm) Weight: 75.3 kg BMI (Calculated): 28.48   Intake/Output last 2 shifts:  08/10 0701 - 08/11 0700 In: 2969.1 [I.V.:1969.1; IV Piggyback:1000] Out: -    Physical Exam:  Constitutional: alert, cooperative and no distress  HENT: normocephalic without obvious abnormality  Eyes: PERRL, EOM's grossly intact and symmetric  Respiratory: breathing non-labored at rest  Cardiovascular: regular rate and sinus rhythm  Gastrointestinal: Soft, non-tender, non-distended, no rebound/guarding Musculoskeletal: no edema or wounds, motor and sensation grossly intact, NT    Labs:  CBC Latest Ref Rng & Units 02/07/2021 02/06/2021 02/06/2021  WBC 4.0 - 10.5 K/uL 7.0 6.7 9.3  Hemoglobin 12.0 - 15.0 g/dL 04/08/2021 61.6 15.6(H)   Hematocrit 36.0 - 46.0 % 39.4 39.9 42.1  Platelets 150 - 400 K/uL 213 209 232   CMP Latest Ref Rng & Units 02/07/2021 02/06/2021 02/06/2021  Glucose 70 - 99 mg/dL 98 86 04/08/2021)  BUN 6 - 20 mg/dL 7 7 9   Creatinine 0.44 - 1.00 mg/dL 710(G 2.69  Sodium 135 - 145 mmol/L 139 140 140  Potassium 3.5 - 5.1 mmol/L 3.6 3.3(L) 3.8  Chloride 98 - 111 mmol/L 107 107 106  CO2 22 - 32 mmol/L 25 25 28   Calcium 8.9 - 10.3 mg/dL 8.3(L) 7.9(L) 9.3  Total Protein 6.5 - 8.1 g/dL - - 7.7  Total Bilirubin 0.3 - 1.2 mg/dL - - 1.1  Alkaline Phos 38 - 126 U/L - - 87  AST 15 - 41 U/L - - 23  ALT 0 - 44 U/L - - 38     Imaging studies:   KUB (02/07/2021) personally reviewed which shows air throughout the colon, a few loops of mildly dilated small bowel centrally, and radiologist report pending...   Assessment/Plan: (ICD-10's: K59.609) 35 y.o. 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 Defer to Dr 31.    -  Monitor abdominal examination; on-going bowel function - Pain control prn; antiemetics prn   - Mobilization encouraged   - Further management per primary service; we will follow    All of the above findings and recommendations were discussed with the patient, and the medical team, and all of patient's questions were answered to her expressed satisfaction.  -- Lynden Oxford, PA-C [facility] 02/07/2021, 7:57 AM [phone] M-F: 7am - 4pm

## 2021-02-07 NOTE — Progress Notes (Signed)
[facility]  - [place] [facility]   PATIENT NAME: Cathy Bray    MR#:  [id number]  DATE OF BIRTH:  1986/04/28  SUBJECTIVE:   patient tolerating clear liquid diet. She seems to be passing gas. Feels better than yesterday. No vomiting REVIEW OF SYSTEMS:   Review of Systems  Constitutional:  Negative for chills, fever and weight loss.  HENT:  Negative for ear discharge, ear pain and nosebleeds.   Eyes:  Negative for blurred vision, pain and discharge.  Respiratory:  Negative for sputum production, shortness of breath, wheezing and stridor.   Cardiovascular:  Negative for chest pain, palpitations, orthopnea and PND.  Gastrointestinal:  Positive for nausea. Negative for abdominal pain, diarrhea and vomiting.  Genitourinary:  Negative for frequency and urgency.  Musculoskeletal:  Negative for back pain and joint pain.  Neurological:  Negative for sensory change, speech change, focal weakness and weakness.  Psychiatric/Behavioral:  Negative for depression and hallucinations. The patient is not nervous/anxious.   Tolerating Diet:CLD Tolerating PT: ambulatory  DRUG ALLERGIES:   Allergies  Allergen Reactions  . Shrimp Extract Allergy Skin Test   . Shrimp [Shellfish Allergy] Hives    VITALS:  Blood pressure 120/78, pulse (!) 55, temperature 98.6 F (37 C), resp. rate 18, height 5\' 4"  (1.626 m), weight 75.3 kg, last menstrual period 01/31/2021, SpO2 99 %.  PHYSICAL EXAMINATION:   Physical Exam  GENERAL:  35 y.o.-year-old patient lying in the bed with no acute distress.  LUNGS: Normal breath sounds bilaterally, no wheezing, rales, rhonchi. No use of accessory muscles of respiration.  CARDIOVASCULAR: S1, S2 normal. No murmurs, rubs, or gallops.  ABDOMEN: Soft, nontender, nondistended. few Bowel sounds present.  EXTREMITIES: No cyanosis, clubbing or edema b/l.    NEUROLOGIC: non focal PSYCHIATRIC:  patient is alert and oriented x 3.  SKIN: No obvious rash,  lesion, or ulcer.   LABORATORY PANEL:  CBC Recent Labs  Lab 02/07/21 0416  WBC 7.0  HGB 13.7  HCT 39.4  PLT 213    Chemistries  Recent Labs  Lab 02/06/21 0550 02/06/21 1515 02/07/21 0416  NA 140 140 139  K 3.8 3.3* 3.6  CL 106 107 107  CO2 28 25 25   GLUCOSE 110* 86 98  BUN 9 7 7   CREATININE 0.62 0.63 0.56  CALCIUM 9.3 7.9* 8.3*  MG  --  1.9  --   AST 23  --   --   ALT 38  --   --   ALKPHOS 87  --   --   BILITOT 1.1  --   --    Cardiac Enzymes No results for input(s): TROPONINI in the last 168 hours. RADIOLOGY:  Abd 1 View (KUB)  Result Date: 02/07/2021 CLINICAL DATA:  History of small-bowel obstruction EXAM: ABDOMEN - 1 VIEW COMPARISON:  CT abdomen and pelvis dated October 07, 2020 FINDINGS: Gas is seen in the small and large bowel. Air-filled loops of small bowel measure up to 1.9 cm in short axis which is first course No aggressive osseous lesions. IMPRESSION: Mildly prominent air-filled loops of small bowel with gas seen in the colon. Electronically Signed   By: MD   On: 02/07/2021 11:15   CT ABDOMEN PELVIS W CONTRAST  Result Date: 02/06/2021 CLINICAL DATA:  Right lower quadrant abdominal pain with nausea and vomiting. EXAM: CT ABDOMEN AND PELVIS WITH CONTRAST TECHNIQUE: Multidetector CT imaging of the abdomen and pelvis was performed using the standard protocol following bolus administration  of intravenous contrast. CONTRAST:  45mL OMNIPAQUE IOHEXOL 350 MG/ML SOLN COMPARISON:  CT abdomen pelvis 07/02/2017 FINDINGS: Lower chest: No acute abnormality. Hepatobiliary: No focal liver abnormality is seen. No gallstones, gallbladder wall thickening, or biliary dilatation. Pancreas: Unremarkable. No pancreatic ductal dilatation or surrounding inflammatory changes. Spleen: Normal in size without focal abnormality. Adrenals/Urinary Tract: Adrenal glands are unremarkable. Kidneys are normal, without renal calculi, focal lesion, or hydronephrosis. Bladder is  unremarkable. Stomach/Bowel: Within the central mesentery of the distal small bowel, there is a fat containing, partially calcified mass measuring 1.9 x 1.5 x 1.9 cm (series 2, image 68; series 5, image 40). There is focal angulation and tethering of the adjacent small bowel loops. A mid small bowel loop is dilated up to 3 cm in diameter with small bowel feces sign immediately proximal to a focal narrowing adjacent to the mesenteric mass (series 2, images 61, 72). The small bowel immediately distal to the narrowing demonstrates thickening (series 5, image 32). No enhancing mass is visualized within the small bowel lumen. Colon is normal in caliber with mild fecal burden. Vascular/Lymphatic: No significant vascular findings are present. No enlarged abdominal or pelvic lymph nodes. Reproductive: Bilateral simple appearing ovarian cysts measuring up to 2.5 cm in the left ovary. Other: No abdominal wall hernia or abnormality. No abdominopelvic ascites. Musculoskeletal: No acute or significant osseous findings. Mild multilevel degenerative changes in the visualized distal thoracic spine. 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 Given prior history of exploratory laparotomy, differential includes sclerosing mesenteritis. Carcinoid tumor with mesenteric metastasis is also considered, though a primary carcinoid tumor is not definitely seen. Other considerations would include desmoid tumor. ok Electronically Signed   By: Sherron Ales MD   On: 02/06/2021 09:40   ASSESSMENT AND PLAN:  Cathy Bray is a 35 y.o. female without significant past medical history who presents with n/v low abdominal pain started last pm hours PTA to ED. She noted that pain is diffuse but worse in lower abdomen below umbilicus.Patient describes pain as aching/cramping and sharp. She notes associated n/v and  progression of symptoms.    Partial SBO   -hx c-section followed by xlap  -patient followed by surgery. Started on clear liquid diet. No need for ng currently patient w/o recurrent n/v . Follow further surgery recommendations. -ivfs supportive with pain medicine and antiemetics  -- overall feels better. She tells me she is passing gas. 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  >50% time spent on counselling and coordination of care  Note: This dictation was prepared with Dragon dictation along with smaller phrase technology. Any transcriptional errors that result from this process are unintentional.  Enedina Finner M.D    [facility]   CC: Primary care physician; Patient, No Pcp Per (Inactive) Patient ID: Cathy Bray, female   DOB: 1985-07-30, 35 y.o.   MRN: [id number]

## 2021-02-08 ENCOUNTER — Inpatient Hospital Stay: Payer: Managed Care, Other (non HMO)

## 2021-02-08 DIAGNOSIS — R1900 Intra-abdominal and pelvic swelling, mass and lump, unspecified site: Secondary | ICD-10-CM

## 2021-02-08 LAB — GLUCOSE, CAPILLARY
Glucose-Capillary: 85 mg/dL (ref 70–99)
Glucose-Capillary: 93 mg/dL (ref 70–99)

## 2021-02-08 SURGERY — EXCISION, SMALL INTESTINE, ROBOT-ASSISTED
Anesthesia: General

## 2021-02-08 MED ORDER — POLYETHYLENE GLYCOL 3350 17 G PO PACK
17.0000 g | PACK | Freq: Every day | ORAL | Status: DC
  Administered 2021-02-08: 17 g via ORAL
  Filled 2021-02-08: qty 1

## 2021-02-08 MED ORDER — POLYETHYLENE GLYCOL 3350 17 G PO PACK: 17.0000 g | PACK | Freq: Every day | ORAL | 0 refills | Status: DC | PRN

## 2021-02-08 NOTE — Progress Notes (Signed)
[facility] (cpt [phone])  Hospital Day(s): 2.   Interval History: Patient seen and examined, no acute events or new complaints overnight. Patient reports she continues to feel much better compared to presentation. Very mild abdominal soreness. She denies fever, chills, nausea, emesis. No new labs this morning. KUB continues to show air throughout the colon without gross small bowel dilation. She has been tolerating clear liquids well. She is passing flatus   Review of Systems:  Constitutional: denies fever, chills  HEENT: denies cough or congestion  Respiratory: denies any shortness of breath  Cardiovascular: denies chest pain or palpitations  Gastrointestinal: + abdominal pain (mild, soreness, improved), denied N/V, or diarrhea/and bowel function as per interval history Genitourinary: denies burning with urination or urinary frequency  Vital signs in last 24 hours: [min-max] current  Temp:  [97.8 F (36.6 C)-98.6 F (37 C)] 97.8 F (36.6 C) (08/12 0433) Pulse Rate:  [55-78] 58 (08/12 0433) Resp:  [18-20] 20 (08/12 0433) BP: (120-129)/(78-87) 123/78 (08/12 0433) SpO2:  [98 %-100 %] 98 % (08/12 0433)     Height: 5\' 4"  (162.6 cm) Weight: 75.3 kg BMI (Calculated): 28.48   Intake/Output last 2 shifts:  08/11 0701 - 08/12 0700 In: 840 [P.O.:840] Out: -    Physical Exam:  Constitutional: alert, cooperative and no distress  HENT: normocephalic without obvious abnormality  Eyes: PERRL, EOM's grossly intact and symmetric  Respiratory: breathing non-labored at rest  Cardiovascular: regular rate and sinus rhythm  Gastrointestinal: Soft, non-tender, non-distended, no rebound/guarding Musculoskeletal: no edema or wounds, motor and sensation grossly intact, NT    Labs:  CBC Latest Ref Rng & Units 02/07/2021 02/06/2021 02/06/2021  WBC 4.0 - 10.5 K/uL 7.0 6.7 9.3  Hemoglobin 12.0 - 15.0 g/dL 04/08/2021 94.4 15.6(H)  Hematocrit 36.0 - 46.0 % 39.4 39.9 42.1   Platelets 150 - 400 K/uL 213 209 232   CMP Latest Ref Rng & Units 02/07/2021 02/06/2021 02/06/2021  Glucose 70 - 99 mg/dL 98 86 04/08/2021)  BUN 6 - 20 mg/dL 7 7 9   Creatinine 0.44 - 1.00 mg/dL 591(M 3.84  Sodium 135 - 145 mmol/L 139 140 140  Potassium 3.5 - 5.1 mmol/L 3.6 3.3(L) 3.8  Chloride 98 - 111 mmol/L 107 107 106  CO2 22 - 32 mmol/L 25 25 28   Calcium 8.9 - 10.3 mg/dL 8.3(L) 7.9(L) 9.3  Total Protein 6.5 - 8.1 g/dL - - 7.7  Total Bilirubin 0.3 - 1.2 mg/dL - - 1.1  Alkaline Phos 38 - 126 U/L - - 87  AST 15 - 41 U/L - - 23  ALT 0 - 44 U/L - - 38     Imaging studies:  KUB (02/08/2021) personally reviewed which continues to show air throughout the colon without gross small bowel dilation, and radiologist report pending   Assessment/Plan: (ICD-10's: K85.609) 35 y.o. 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 If she tolerates advancement of  diet today, she can be considered for discharge this afternoon.      All of the above findings and recommendations were discussed with the patient, and the medical team, and all of patient's questions were answered to her expressed satisfaction.  -- Lynden Oxford, PA-C [facility] 02/08/2021, 7:18 AM [phone] M-F: 7am - 4pm

## 2021-02-08 NOTE — Plan of Care (Signed)

## 2021-02-08 NOTE — Discharge Summary (Signed)
[facility] [place] [facility]   PATIENT NAME: Cathy EhlersJanet Bray    MR#:  [id number]  DATE OF BIRTH:  09/14/1985  DATE OF ADMISSION:  02/06/2021 ADMITTING PHYSICIAN: Lurline DelSara-Maiz A Thomas, MD  DATE OF DISCHARGE: 02/08/2021  1:23 PM  PRIMARY CARE PHYSICIAN: Patient, No Pcp Per (Inactive)    ADMISSION DIAGNOSIS:  Small bowel obstruction (HCC) [K56.609]  DISCHARGE DIAGNOSIS:  Active Problems:   Partial small bowel obstruction (HCC)   SECONDARY DIAGNOSIS:   Past Medical History:  Diagnosis Date  . Dysplasia of cervix   . Hematuria   . History of LEEP (loop electrosurgical excision procedure) of cervix complicating pregnancy   . Infertility, female   . Missed ab   . Nausea and vomiting during pregnancy   . Ovarian cyst    LEFT- 2.4CM   . S/P urgent classical cesarean section 01/08/2015   Done for malpresentation (fetal arm in vagina) at 66104w2d s/p PPROM    . Second trimester bleeding     HOSPITAL COURSE:   Partial small bowel obstruction.  Likely adhesions.  Patient tolerated advance diet during the hospital course and had a bowel movement prior to disposition.  Felt better and wanted to go home. 1.9 x 1.5 x 1.9 cm partially calcified mass.  As per general surgery this has been stable over the years.  Follow-up as outpatient for potential definitive management.  DISCHARGE CONDITIONS:   Satisfactory  CONSULTS OBTAINED:    General surgery  DRUG ALLERGIES:   Allergies  Allergen Reactions  . Shrimp Extract Allergy Skin Test   . Shrimp [Shellfish Allergy] Hives    DISCHARGE MEDICATIONS:   Allergies as of 02/08/2021       Reactions   Shrimp Extract Allergy Skin Test    Shrimp [shellfish Allergy] Hives        Medication List     TAKE these medications    fluticasone 50 MCG/ACT nasal spray Commonly known as: FLONASE Place 1-2 sprays into both nostrils daily as needed for allergies or rhinitis.   polyethylene glycol 17 g packet Commonly known  as: MIRALAX / GLYCOLAX Take 17 g by mouth daily as needed.         DISCHARGE INSTRUCTIONS:   Set up with new PMD Follow-up Dr. Claudine Bray general surgery as outpatient  If you experience worsening of your admission symptoms, develop shortness of breath, life threatening emergency, suicidal or homicidal thoughts you must seek medical attention immediately by calling 911 or calling your MD immediately  if symptoms less severe.  You Must read complete instructions/literature along with all the possible adverse reactions/side effects for all the Medicines you take and that have been prescribed to you. Take any new Medicines after you have completely understood and accept all the possible adverse reactions/side effects.   Please note  You were cared for by a hospitalist during your hospital stay. If you have any questions about your discharge medications or the care you received while you were in the hospital after you are discharged, you can call the unit and asked to speak with the hospitalist on call if the hospitalist that took care of you is not available. Once you are discharged, your primary care physician will handle any further medical issues. Please note that NO REFILLS for any discharge medications will be authorized once you are discharged, as it is imperative that you return to your primary care physician (or establish a relationship with a primary care physician if you do not have  one) for your aftercare needs so that they can reassess your need for medications and monitor your lab values.    Today   CHIEF COMPLAINT:   Chief Complaint  Patient presents with  . Abdominal Pain    HISTORY OF PRESENT ILLNESS:  Cathy Bray  is a 35 y.o. female came in with abdominal pain.   VITAL SIGNS:  Blood pressure 119/78, pulse 62, temperature 97.8 F (36.6 C), temperature source Axillary, resp. rate 15, height 5\' 4"  (1.626 m), weight 75.3 kg, last menstrual period 01/31/2021, SpO2 100  %.  I/O:   Intake/Output Summary (Last 24 hours) at 02/08/2021 1700 Last data filed at 02/08/2021 1300 Gross per 24 hour  Intake 1080 ml  Output --  Net 1080 ml    PHYSICAL EXAMINATION:  GENERAL:  35 y.o.-year-old patient lying in the bed with no acute distress.  EYES: Pupils equal, round, reactive to light and accommodation. No scleral icterus. Extraocular muscles intact.  HEENT: Head atraumatic, normocephalic. Oropharynx and nasopharynx clear.  LUNGS: Normal breath sounds bilaterally, no wheezing, rales,rhonchi or crepitation. No use of accessory muscles of respiration.  CARDIOVASCULAR: S1, S2 normal. No murmurs, rubs, or gallops.  ABDOMEN: Soft, non-tender, non-distended. Bowel sounds present. No organomegaly or mass.  EXTREMITIES: No pedal edema.  NEUROLOGIC: Cranial nerves II through XII are intact. Muscle strength 5/5 in all extremities. Sensation intact. Gait not checked.  PSYCHIATRIC: The patient is alert and oriented x 3.  SKIN: No obvious rash, lesion, or ulcer.   DATA REVIEW:   CBC Recent Labs  Lab 02/07/21 0416  WBC 7.0  HGB 13.7  HCT 39.4  PLT 213    Chemistries  Recent Labs  Lab 02/06/21 0550 02/06/21 1515 02/07/21 0416  NA 140 140 139  K 3.8 3.3* 3.6  CL 106 107 107  CO2 28 25 25   GLUCOSE 110* 86 98  BUN 9 7 7   CREATININE 0.62 0.63 0.56  CALCIUM 9.3 7.9* 8.3*  MG  --  1.9  --   AST 23  --   --   ALT 38  --   --   ALKPHOS 87  --   --   BILITOT 1.1  --   --       Microbiology Results  Results for orders placed or performed during the hospital encounter of 02/06/21  Resp Panel by RT-PCR (Flu A&B, Covid) Nasopharyngeal Swab     Status: None   Collection Time: 02/06/21 10:16 AM   Specimen: Nasopharyngeal Swab; Nasopharyngeal(NP) swabs in vial transport medium  Result Value Ref Range Status   SARS Coronavirus 2 by RT PCR NEGATIVE NEGATIVE Final    Comment: (NOTE) SARS-CoV-2 target nucleic acids are NOT DETECTED.  The SARS-CoV-2 RNA is  generally detectable in upper respiratory specimens during the acute phase of infection. The lowest concentration of SARS-CoV-2 viral copies this assay can detect is 138 copies/mL. A negative result does not preclude SARS-Cov-2 infection and should not be used as the sole basis for treatment or other patient management decisions. A negative result may occur with  improper specimen collection/handling, submission of specimen other than nasopharyngeal swab, presence of viral mutation(s) within the areas targeted by this assay, and inadequate number of viral copies(<138 copies/mL). A negative result must be combined with clinical observations, patient history, and epidemiological information. The expected result is Negative.  Fact Sheet for Patients:   Fact Sheet for Healthcare Providers:  04/08/21  This test is no t yet approved or cleared  by the [place] and  has been authorized for detection and/or diagnosis of SARS-CoV-2 by FDA under an Emergency Use Authorization (EUA). This EUA will remain  in effect (meaning this test can be used) for the duration of the COVID-19 declaration under Section 564(b)(1) of the Act, 21 U.S.C.section 360bbb-3(b)(1), unless the authorization is terminated  or revoked sooner.       Influenza A by PCR NEGATIVE NEGATIVE Final   Influenza B by PCR NEGATIVE NEGATIVE Final    Comment: (NOTE) The Xpert Xpress SARS-CoV-2/FLU/RSV plus assay is intended as an aid in the diagnosis of influenza from Nasopharyngeal swab specimens and should not be used as a sole basis for treatment. Nasal washings and aspirates are unacceptable for Xpert Xpress SARS-CoV-2/FLU/RSV testing.  Fact Sheet for Patients: BloggerCourse.com  Fact Sheet for Healthcare Providers: SeriousBroker.it  This test is not yet approved or cleared by the [place] FDA and has been authorized for detection and/or diagnosis of SARS-CoV-2 by FDA under an Emergency Use Authorization (EUA). This EUA will remain in effect (meaning this test can be used) for the duration of the COVID-19 declaration under Section 564(b)(1) of the Act, 21 U.S.C. section 360bbb-3(b)(1), unless the authorization is terminated or revoked.  Performed at [facility], [address] [place], [place] [address]     RADIOLOGY:  Abd 1 View (KUB)  Result Date: 02/07/2021 CLINICAL DATA:  History of small-bowel obstruction EXAM: ABDOMEN - 1 VIEW COMPARISON:  CT abdomen and pelvis dated October 07, 2020 FINDINGS: Gas is seen in the small and large bowel. Air-filled loops of small bowel measure up to 1.9 cm in short axis which is first course No aggressive osseous lesions. IMPRESSION: Mildly prominent air-filled loops of small bowel with gas seen in the colon. Electronically Signed   By: Allegra Lai MD   On: 02/07/2021 11:15   DG Abd 2 Views  Result Date: 02/08/2021 CLINICAL DATA:  Partial small bowel obstruction (HCC) K56.600 (ICD-10-CM). Additional history provided: SBO, abdominal pain, vomiting yesterday. EXAM: ABDOMEN - 2 VIEW COMPARISON:  Abdominal radiograph 02/07/2021. CT abdomen/pelvis 02/06/2021. FINDINGS: Visualized small bowel loops now measure up to 3 cm in diameter (upper limits of normal). Moderate colonic stool burden. No evidence of intraperitoneal free air. No acute bony abnormality. IMPRESSION: Visualized small bowel loops now measure up to 3 cm in diameter (upper limits of normal). Moderate colonic stool burden. Electronically Signed   By: Jackey Loge DO   On: 02/08/2021 09:10       Management plans discussed with the patient, and he is in agreement.  CODE STATUS:     Code Status Orders  (From admission, onward)           Start     Ordered   02/06/21 1406  Full code  Continuous        02/06/21 1408           Code Status History      Date Active Date Inactive Code Status Order ID Comments User Context   01/23/2015 1647 01/31/2015 1353 Full Code [id number]  Defrancesco, Prentice Docker, MD Inpatient   01/22/2015 0001 01/23/2015 1647 Full Code [id number]  Defrancesco, Prentice Docker, MD Inpatient   01/08/2015 1831 01/10/2015 1730 Full Code [id number]  Tereso Newcomer, MD Inpatient   12/31/2014 0519 01/08/2015 1831 Full Code [id number]  Erasmo Downer, MD Inpatient       TOTAL TIME TAKING CARE OF THIS PATIENT: 32 minutes.  Alford Highland M.D on 02/08/2021 at 5:00 PM   [facility]  CC: Primary care physician; Patient, No Pcp Per (Inactive)

## 2021-02-28 ENCOUNTER — Ambulatory Visit (INDEPENDENT_AMBULATORY_CARE_PROVIDER_SITE_OTHER): Payer: Managed Care, Other (non HMO) | Admitting: Surgery

## 2021-02-28 ENCOUNTER — Encounter: Payer: Self-pay | Admitting: Surgery

## 2021-02-28 ENCOUNTER — Other Ambulatory Visit: Payer: Self-pay

## 2021-02-28 VITALS — BP 127/89 | HR 76 | Temp 98.5°F | Ht 64.0 in | Wt 164.0 lb

## 2021-02-28 DIAGNOSIS — K56600 Partial intestinal obstruction, unspecified as to cause: Secondary | ICD-10-CM

## 2021-02-28 NOTE — Patient Instructions (Signed)
   Follow-up with our office as needed.  Please call and ask to speak with a nurse if you develop questions or concerns.  

## 2021-02-28 NOTE — Progress Notes (Signed)
Surgical Clinic Progress/Follow-up Note   HPI:  35 y.o. Female presents to clinic for recent hospitalization follow-up  Days/weeks follow the last evaluation. Patient reports some improvement/resolution of prior issues and has been tolerating regular diet with +flatus and normal BM's, denies N/V, fever/chills, CP, or SOB.  She reports that ground beef and tacos had bothered her significantly, otherwise she is eating chicken and rice and not having any complaints.  Review of Systems:  Constitutional: denies fever/chills  ENT: denies sore throat, hearing problems  Respiratory: denies shortness of breath, wheezing  Cardiovascular: denies chest pain, palpitations  Gastrointestinal: denies new abdominal pain, N/V, or diarrhea Skin: Denies any other rashes or skin discolorations  Vital Signs:  Ht 5\' 4"  (1.626 m)   Wt 164 lb (74.4 kg)   LMP 01/31/2021 (Exact Date)   BMI 28.15 kg/m    Physical Exam:  Constitutional:  -- Normal body habitus  -- Awake, alert, and oriented x3  Pulmonary:  -- No crackles -- Equal breath sounds bilaterally -- Breathing non-labored at rest Cardiovascular:  -- S1, S2 present  -- No pericardial rubs  Gastrointestinal:  -- Soft and non-distended, non-tender   Musculoskeletal / Integumentary:  -- Wounds or skin discoloration: None appreciated  -- Extremities: B/L UE and LE FROM, hands and feet warm, no edema   Laboratory studies:   Imaging: No new pertinent imaging available for review.  We reviewed her most recent CT scan and compared it to that of 3 years ago comparing the reported calcified cystic mass.  I believe this mass was present 3 years ago but is only become calcified in the interim.  I suspect this is of completely benign nature.   Assessment:  35 y.o. yo Female with a problem list including...  Patient Active Problem List   Diagnosis Date Noted   Partial small bowel obstruction (HCC) 02/06/2021   Elevated ALT measurement 12/28/2020    Dyslipidemia with elevated low density lipoprotein (LDL) cholesterol and abnormally low high density lipoprotein cholesterol 12/28/2020   Knee pain 01/30/2016   Abnormal uterine bleeding 07/24/2015   History of dysmenorrhea 05/17/2015   Status post exploratory laparotomy 02/15/2015   Abdominal mass 01/22/2015   Post op infection 01/21/2015   S/P cesarean section 01/08/2015   Cervical dysplasia 12/15/2014   History of loop electrical excision procedure (LEEP) 12/15/2014    presents to clinic for follow-up evaluation of recent episode of small bowel obstruction, apparently completely resolved, and progressing well.  Plan:              - return to clinic as needed, instructed to call office if any questions or concerns  All of the above recommendations were discussed with the patient, and all of patient's questions were answered to her expressed satisfaction.  These notes generated with voice recognition software. I apologize for typographical errors.  12/17/2014, MD, FACS [place]: [facility] - Partnering for exceptional care. Office: [phone]

## 2021-05-04 ENCOUNTER — Telehealth: Payer: Managed Care, Other (non HMO) | Admitting: Nurse Practitioner

## 2021-05-04 DIAGNOSIS — J0100 Acute maxillary sinusitis, unspecified: Secondary | ICD-10-CM

## 2021-05-04 MED ORDER — AMOXICILLIN-POT CLAVULANATE 875-125 MG PO TABS: 1.0000 | ORAL_TABLET | Freq: Two times a day (BID) | ORAL | 0 refills | Status: DC

## 2021-05-04 MED ORDER — PROMETHAZINE-DM 6.25-15 MG/5ML PO SYRP: 5.0000 mL | ORAL_SOLUTION | Freq: Four times a day (QID) | ORAL | 0 refills | Status: DC | PRN

## 2021-05-04 NOTE — Patient Instructions (Signed)

## 2021-05-04 NOTE — Progress Notes (Signed)
Virtual Visit Consent   Cathy Bray, you are scheduled for a virtual visit with Mary-Margaret Daphine Deutscher, FNP, a [facility] provider, today.     Just as with appointments in the office, your consent must be obtained to participate.  Your consent will be active for this visit and any virtual visit you may have with one of our providers in the next 365 days.     If you have a MyChart account, a copy of this consent can be sent to you electronically.  All virtual visits are billed to your insurance company just like a traditional visit in the office.    As this is a virtual visit, video technology does not allow for your provider to perform a traditional examination.  This may limit your provider's ability to fully assess your condition.  If your provider identifies any concerns that need to be evaluated in person or the need to arrange testing (such as labs, EKG, etc.), we will make arrangements to do so.     Although advances in technology are sophisticated, we cannot ensure that it will always work on either your end or our end.  If the connection with a video visit is poor, the visit may have to be switched to a telephone visit.  With either a video or telephone visit, we are not always able to ensure that we have a secure connection.     I need to obtain your verbal consent now.   Are you willing to proceed with your visit today? YES   Cathy Bray has provided verbal consent on 05/04/2021 for a virtual visit (video or telephone).   Mary-Margaret Daphine Deutscher, FNP   Date: 05/04/2021 9:34 AM   Virtual Visit via Video Note   I, Mary-Margaret Daphine Deutscher, connected with Cathy Bray ([id number], January 06, 1986) on 05/04/21 at  9:30 AM EDT by a video-enabled telemedicine application and verified that I am speaking with the correct person using two identifiers.  Location: Patient: Virtual Visit Location Patient: Home Provider: Virtual Visit Location Provider: Mobile   I discussed the limitations of  evaluation and management by telemedicine and the availability of in person appointments. The patient expressed understanding and agreed to proceed.    History of Present Illness: Cathy Bray is a 35 y.o. who identifies as a female who was assigned female at birth, and is being seen today for uri.  HPI: Patient states that she has cough and congestion, bil ear pain. Has been going on for 2 weeks and nothing is helping. She has been taking OTC couigh and cild meds and mucinex.   Review of Systems  Constitutional:  Positive for chills and malaise/fatigue. Negative for fever.  HENT:  Positive for congestion, ear pain, sinus pain and sore throat.   Respiratory:  Positive for cough. Sputum production: green.  Musculoskeletal:  Negative for myalgias.  Neurological:  Negative for headaches.   Problems:  Patient Active Problem List   Diagnosis Date Noted   Partial small bowel obstruction (HCC) 02/06/2021   Elevated ALT measurement 12/28/2020   Dyslipidemia with elevated low density lipoprotein (LDL) cholesterol and abnormally low high density lipoprotein cholesterol 12/28/2020   Knee pain 01/30/2016   Abnormal uterine bleeding 07/24/2015   History of dysmenorrhea 05/17/2015   Status post exploratory laparotomy 02/15/2015   Abdominal mass 01/22/2015   Post op infection 01/21/2015   S/P cesarean section 01/08/2015   Cervical dysplasia 12/15/2014   History of loop electrical excision procedure (LEEP) 12/15/2014    Allergies:  Allergies  Allergen Reactions   Shrimp Extract Allergy Skin Test    Shrimp [Shellfish Allergy] Hives   Medications:  Current Outpatient Medications:    fluticasone (FLONASE) 50 MCG/ACT nasal spray, Place 1-2 sprays into both nostrils daily as needed for allergies or rhinitis., Disp: , Rfl:    polyethylene glycol (MIRALAX / GLYCOLAX) 17 g packet, Take 17 g by mouth daily as needed. (Patient not taking: Reported on 02/28/2021), Disp: 30 each, Rfl:  0  Observations/Objective: Patient is well-developed, well-nourished in no acute distress.  Resting comfortably  at home.  Head is normocephalic, atraumatic.  No labored breathing.  Speech is clear and coherent with logical content.  Patient is alert and oriented at baseline.  Nose sounds congestion Voice hoarse Deep cough  Assessment and Plan:  Cathy Bray in today with chief complaint of URI   1. Acute non-recurrent maxillary sinusitis 1. Take meds as prescribed 2. Use a cool mist humidifier especially during the winter months and when heat has been humid. 3. Use saline nose sprays frequently 4. Saline irrigations of the nose can be very helpful if done frequently.  * 4X daily for 1 week*  * Use of a nettie pot can be helpful with this. Follow directions with this* 5. Drink plenty of fluids 6. Keep thermostat turn down low 7.For any cough or congestion as prescribed 8. For fever or aces or pains- take tylenol or ibuprofen appropriate for age and weight.  * for fevers greater than 101 orally you may alternate ibuprofen and tylenol every  3 hours.    Meds ordered this encounter  Medications   amoxicillin-clavulanate (AUGMENTIN) 875-125 MG tablet    Sig: Take 1 tablet by mouth 2 (two) times daily.    Dispense:  14 tablet    Refill:  0    Order Specific Question:   Supervising Provider    Answer:   Eber Hong [[id number]]   promethazine-dextromethorphan (PROMETHAZINE-DM) 6.25-15 MG/5ML syrup    Sig: Take 5 mLs by mouth 4 (four) times daily as needed for cough.    Dispense:  118 mL    Refill:  0    Order Specific Question:   Supervising Provider    Answer:   Eber Hong [[id number]]       Follow Up Instructions: I discussed the assessment and treatment plan with the patient. The patient was provided an opportunity to ask questions and all were answered. The patient agreed with the plan and demonstrated an understanding of the instructions.  A copy of instructions were sent  to the patient via MyChart.  The patient was advised to call back or seek an in-person evaluation if the symptoms worsen or if the condition fails to improve as anticipated.  Time:  I spent 10 minutes with the patient via telehealth technology discussing the above problems/concerns.    Mary-Margaret Daphine Deutscher, FNP
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
## 2021-12-25 ENCOUNTER — Encounter: Payer: Managed Care, Other (non HMO) | Admitting: Obstetrics and Gynecology

## 2023-12-27 ENCOUNTER — Encounter: Payer: Self-pay | Admitting: Emergency Medicine

## 2023-12-27 ENCOUNTER — Other Ambulatory Visit: Payer: Self-pay

## 2023-12-27 DIAGNOSIS — N938 Other specified abnormal uterine and vaginal bleeding: Secondary | ICD-10-CM | POA: Insufficient documentation

## 2023-12-27 DIAGNOSIS — N939 Abnormal uterine and vaginal bleeding, unspecified: Secondary | ICD-10-CM | POA: Diagnosis present

## 2023-12-27 DIAGNOSIS — R102 Pelvic and perineal pain: Secondary | ICD-10-CM | POA: Diagnosis not present

## 2023-12-27 LAB — CBC
HCT: 38.2 % (ref 36.0–46.0)
Hemoglobin: 13.7 g/dL (ref 12.0–15.0)
MCH: 34.2 pg — ABNORMAL HIGH (ref 26.0–34.0)
MCHC: 35.9 g/dL (ref 30.0–36.0)
MCV: 95.3 fL (ref 80.0–100.0)
Platelets: 256 10*3/uL (ref 150–400)
RBC: 4.01 MIL/uL (ref 3.87–5.11)
RDW: 12.3 % (ref 11.5–15.5)
WBC: 8.2 10*3/uL (ref 4.0–10.5)
nRBC: 0 % (ref 0.0–0.2)

## 2023-12-27 NOTE — ED Triage Notes (Signed)
 Pt to ED from home c/o vaginal bleeding for the last week, heaviest today.  States for last 40 minutes bleeding has been consistent, bright red blood, large clots, approx 5-6pads worth an hour.  Hx of irregular periods, no birth control.

## 2023-12-28 ENCOUNTER — Emergency Department
Admission: EM | Admit: 2023-12-28 | Discharge: 2023-12-28 | Disposition: A | Discharge status: Home / Self Care | Attending: Emergency Medicine | Admitting: Emergency Medicine

## 2023-12-28 ENCOUNTER — Emergency Department

## 2023-12-28 DIAGNOSIS — N938 Other specified abnormal uterine and vaginal bleeding: Secondary | ICD-10-CM

## 2023-12-28 LAB — SAMPLE TO BLOOD BANK

## 2023-12-28 LAB — POC URINE PREG, ED: Preg Test, Ur: NEGATIVE

## 2023-12-28 LAB — HCG, QUANTITATIVE, PREGNANCY: hCG, Beta Chain, Quant, S: <1 m[IU]/mL (ref <5)

## 2023-12-28 MED ORDER — MEDROXYPROGESTERONE ACETATE 10 MG PO TABS: 10.0000 mg | ORAL_TABLET | Freq: Every day | ORAL | 0 refills | Status: DC

## 2023-12-28 NOTE — ED Provider Notes (Signed)
 [facility] Provider Note    Event Date/Time   First MD Initiated Contact with Patient 12/28/23 0037     (approximate)   History   Vaginal Bleeding   HPI  Cathy Bray is a 38 y.o. female who presents to the ED from home with a chief complaint of heavy vaginal bleeding x 1 week.  Patient reports irregular menstrual cycles, not on OCPs.  Started approximately 4 weeks ago with a heavy cycle, then resolving, period returned, got better and now heavier again.  Endorses associated pelvic cramps.  Denies fever/chills, chest pain, shortness of breath, nausea, vomiting or dizziness.  Denies anticoagulant use.     Past Medical History   Past Medical History:  Diagnosis Date   Dysplasia of cervix    Hematuria    History of LEEP (loop electrosurgical excision procedure) of cervix complicating pregnancy    Infertility, female    Missed ab    Nausea and vomiting during pregnancy    Ovarian cyst    LEFT- 2.4CM    S/P urgent classical cesarean section 01/08/2015   Done for malpresentation (fetal arm in vagina) at [redacted]w[redacted]d s/p PPROM     Second trimester bleeding      Active Problem List   Patient Active Problem List   Diagnosis Date Noted   Partial small bowel obstruction (HCC) 02/06/2021   Elevated ALT measurement 12/28/2020   Dyslipidemia with elevated low density lipoprotein (LDL) cholesterol and abnormally low high density lipoprotein cholesterol 12/28/2020   Knee pain 01/30/2016   Abnormal uterine bleeding 07/24/2015   History of dysmenorrhea 05/17/2015   Status post exploratory laparotomy 02/15/2015   Abdominal mass 01/22/2015   Post op infection 01/21/2015   S/P cesarean section 01/08/2015   Cervical dysplasia 12/15/2014   History of loop electrical excision procedure (LEEP) 12/15/2014     Past Surgical History   Past Surgical History:  Procedure Laterality Date   CESAREAN SECTION N/A 01/08/2015   Procedure: CESAREAN SECTION;  Surgeon: Gloris DELENA Hugger, MD;  Location: [facility];  Service: Obstetrics;  Laterality: N/A;  classical on uterus    LAPAROTOMY N/A 01/23/2015   Procedure: EXPLORATORY LAPAROTOMY;  Surgeon: Gladis DELENA Dollar, MD;  Location: [facility] OR[facility];  Service: Gynecology;  Laterality: N/A;   LEEP  2012   [facility]     Home Medications   Prior to Admission medications   Medication Sig Start Date End Date Taking? Authorizing Provider  medroxyPROGESTERone  (PROVERA ) 10 MG tablet Take 1 tablet (10 mg total) by mouth daily. 12/28/23  Yes Jabria Loos J, MD  amoxicillin -clavulanate (AUGMENTIN ) 875-125 MG tablet Take 1 tablet by mouth 2 (two) times daily. 05/04/21   Gladis Mary-Margaret, FNP  fluticasone  (FLONASE ) 50 MCG/ACT nasal spray Place 1-2 sprays into both nostrils daily as needed for allergies or rhinitis.    [provider]  polyethylene glycol (MIRALAX  / GLYCOLAX ) 17 g packet Take 17 g by mouth daily as needed. Patient not taking: Reported on 02/28/2021 02/08/21   Josette Ade, MD  promethazine -dextromethorphan (PROMETHAZINE -DM) 6.25-15 MG/5ML syrup Take 5 mLs by mouth 4 (four) times daily as needed for cough. 05/04/21   Gladis Mary-Margaret, FNP     Allergies  Shrimp extract and Shrimp [shellfish allergy]   Family History   Family History  Problem Relation Age of Onset   Diabetes Mother    Heart disease Neg Hx    Breast cancer Neg Hx    Colon cancer Neg Hx    Ovarian cancer  Neg Hx      Physical Exam  Triage Vital Signs: ED Triage Vitals  Encounter Vitals Group     BP 12/27/23 2332 (!) 158/96     Girls Systolic BP Percentile --      Girls Diastolic BP Percentile --      Boys Systolic BP Percentile --      Boys Diastolic BP Percentile --      Pulse Rate 12/27/23 2332 (!) 102     Resp 12/27/23 2332 18     Temp 12/27/23 2332 98.9 F (37.2 C)     Temp Source 12/27/23 2332 Oral     SpO2 12/27/23 2332 100 %     Weight 12/27/23 2332 173 lb (78.5 kg)     Height 12/27/23 2332 5' 4 (1.626 m)     Head  Circumference --      Peak Flow --      Pain Score 12/27/23 2335 0     Pain Loc --      Pain Education --      Exclude from Growth Chart --     Updated Vital Signs: BP (!) 158/96 (BP Location: Right Arm)   Pulse (!) 102   Temp 98.9 F (37.2 C) (Oral)   Resp 18   Ht 5' 4 (1.626 m)   Wt 78.5 kg   LMP 12/20/2023 (Approximate)   SpO2 100%   BMI 29.70 kg/m    General: Awake, no distress.  CV:  RRR.  Good peripheral perfusion.  Resp:  Normal effort.  CTAB. Abd:  Nontender.  No distention.  Other:  No truncal vesicles.   ED Results / Procedures / Treatments  Labs (all labs ordered are listed, but only abnormal results are displayed) Labs Reviewed  CBC - Abnormal; Notable for the following components:      Result Value   MCH 34.2 (*)    All other components within normal limits  HCG, QUANTITATIVE, PREGNANCY  POC URINE PREG, ED     EKG  None   RADIOLOGY I have independently visualized interpreted patient's imaging study as well as noted the radiology interpretation:  Pelvic ultrasound: Thickened heterogeneous endometrium  Official radiology report(s): US  PELVIC COMPLETE W TRANSVAGINAL AND TORSION R/O Result Date: 12/28/2023 CLINICAL DATA:  Heavy vaginal bleeding. EXAM: TRANSABDOMINAL AND TRANSVAGINAL ULTRASOUND OF PELVIS DOPPLER ULTRASOUND OF OVARIES TECHNIQUE: Both transabdominal and transvaginal ultrasound examinations of the pelvis were performed. Transabdominal technique was performed for global imaging of the pelvis including uterus, ovaries, adnexal regions, and pelvic cul-de-sac. It was necessary to proceed with endovaginal exam following the transabdominal exam to visualize the endometrium and bilateral ovaries. Color and duplex Doppler ultrasound was utilized to evaluate blood flow to the ovaries. COMPARISON:  July 24, 2015 FINDINGS: Uterus Measurements: 8.7 cm x 4.2 cm x 5.2 cm = volume: 98.2 mL. No fibroids or other mass visualized. Endometrium Thickness:  23.7 mm. The endometrium is heterogeneous in appearance and demonstrates areas of vascularity on color Doppler evaluation. Right ovary Measurements: 2.7 cm x 1.7 cm x 1.5 cm = volume: 3.7 mL. Normal appearance/no adnexal mass. Left ovary Measurements: 2.8 cm x 1.8 cm x 2.1 cm = volume: 5.6 mL. Normal appearance/no adnexal mass. Pulsed Doppler evaluation of both ovaries demonstrates normal low-resistance arterial and venous waveforms. Other findings No abnormal free fluid. IMPRESSION: 1. Thickened, heterogeneous, vascular endometrium that may represent sequelae associated with early or recent intrauterine pregnancy. Correlation with follow-up pelvic ultrasound and serial beta HCG levels is recommended  if this is of clinical concern. In the absence of pregnancy, endometrial biopsy is recommended to exclude sequelae associated endometrial hyperplasia or an underlying neoplastic process. Electronically Signed   By: Suzen Dials M.D.   On: 12/28/2023 01:13     PROCEDURES:  Critical Care performed: No  Procedures   MEDICATIONS ORDERED IN ED: Medications - No data to display   IMPRESSION / MDM / ASSESSMENT AND PLAN / ED COURSE  I reviewed the triage vital signs and the nursing notes.                             38 year old female presenting with dysfunctional uterine bleeding. Differential diagnosis includes, but is not limited to, ovarian cyst, ovarian torsion, acute appendicitis, diverticulitis, urinary tract infection/pyelonephritis, endometriosis, bowel obstruction, colitis, renal colic, gastroenteritis, hernia, fibroids, endometriosis, pregnancy related pain including ectopic pregnancy, etc. I personally reviewed patient's records and note PCP office visit from 07/28/2023 for influenza.  Patient's presentation is most consistent with acute complicated illness / injury requiring diagnostic workup.  Hemoglobin stable.  Beta-hCG negative.  Ultrasound nonspecific, demonstrating thickened  endometrium.  Discussed and offered patient 10-day course of Provera  which she accepts.  She has an appointment with OB/GYN on July 9.  Strict return precautions given.  Patient and spouse verbalized understanding and agree with plan of care.    FINAL CLINICAL IMPRESSION(S) / ED DIAGNOSES   Final diagnoses:  Dysfunctional uterine bleeding     Rx / DC Orders   ED Discharge Orders          Ordered    medroxyPROGESTERone  (PROVERA ) 10 MG tablet  Daily        12/28/23 0141             Note:  This document was prepared using Dragon voice recognition software and may include unintentional dictation errors.   Humberto Addo J, MD 12/28/23 [phone]

## 2023-12-28 NOTE — ED Notes (Signed)
Lab called to add hcg

## 2023-12-28 NOTE — Discharge Instructions (Signed)
 Start Provera  daily x 10 days.  Return to the ER for worsening symptoms, persistent vomiting, fainting or other concerns.

## 2024-01-06 ENCOUNTER — Other Ambulatory Visit (HOSPITAL_COMMUNITY)
Admission: RE | Admit: 2024-01-06 | Discharge: 2024-01-06 | Disposition: A | Discharge status: Home / Self Care | Source: Ambulatory Visit | Attending: Advanced Practice Midwife | Admitting: Advanced Practice Midwife

## 2024-01-06 ENCOUNTER — Encounter: Payer: Self-pay | Admitting: Advanced Practice Midwife

## 2024-01-06 ENCOUNTER — Ambulatory Visit (INDEPENDENT_AMBULATORY_CARE_PROVIDER_SITE_OTHER): Admitting: Advanced Practice Midwife

## 2024-01-06 VITALS — BP 123/78 | HR 88 | Ht 64.0 in | Wt 170.0 lb

## 2024-01-06 DIAGNOSIS — Z124 Encounter for screening for malignant neoplasm of cervix: Secondary | ICD-10-CM

## 2024-01-06 DIAGNOSIS — N939 Abnormal uterine and vaginal bleeding, unspecified: Secondary | ICD-10-CM | POA: Insufficient documentation

## 2024-01-06 NOTE — Progress Notes (Signed)
 Patient ID: Cathy Bray, female   DOB: 06-10-1986, 38 y.o.   MRN: [id number]  Reason for Consult: Vaginal Bleeding (Heavy bleeding with severe pain on first day. Started 6 weeks ago, now light.)   Referred by recent ER and PCP visits  Subjective:  HPI:  Cathy Bray is a 38 y.o. female being seen for recent prolonged and heavy bleeding. Cycles are slightly irregular ranging from 21 to 30 days lasting 5 days and vary from spotting to heavy flow. Mild cramping with periods. Her most recent period started on 11/22/23 and has been continuous since then. She was seen in the ER for the same 1 week ago and then had follow up with her PCP on 12/29/23. Thickened endometrium seen on ultrasound. No fibroids or other mass. H/H within normal limits. Ovaries normal. Normal PAP 3 years ago. History of LEEP. Husband has history of infertility secondary to radiation treatment. Beta Hcg non-pregnant level at ER. Provera  prescribed at ER and she is now on day 7 of treatment. Currently bleeding is light. Discussed the possibility that irregular bleeding is due to perimenopause changes in hormones. She is not interested in IUD or hormonal cycle control. Would like endometrial biopsy to rule out cancer and then may be interested in ablation.    Past Medical History:  Diagnosis Date   Dysplasia of cervix    Hematuria    History of LEEP (loop electrosurgical excision procedure) of cervix complicating pregnancy    Infertility, female    Missed ab    Nausea and vomiting during pregnancy    Ovarian cyst    LEFT- 2.4CM    S/P urgent classical cesarean section 01/08/2015   Done for malpresentation (fetal arm in vagina) at [redacted]w[redacted]d s/p PPROM     Second trimester bleeding    Family History  Problem Relation Age of Onset   Hypertension Mother    Diabetes Father    Heart disease Neg Hx    Breast cancer Neg Hx    Colon cancer Neg Hx    Ovarian cancer Neg Hx    Past Surgical History:  Procedure Laterality Date    CESAREAN SECTION N/A 01/08/2015   Procedure: CESAREAN SECTION;  Surgeon: Gloris DELENA Hugger, MD;  Location: [facility];  Service: Obstetrics;  Laterality: N/A;  classical on uterus    LAPAROTOMY N/A 01/23/2015   Procedure: EXPLORATORY LAPAROTOMY;  Surgeon: Gladis DELENA Dollar, MD;  Location: [facility] OR[facility];  Service: Gynecology;  Laterality: N/A;   LEEP  2012   [facility]    Short Social History:  Social History   Tobacco Use   Smoking status: Never   Smokeless tobacco: Never  Substance Use Topics   Alcohol use: No    Allergies  Allergen Reactions   Shrimp Extract    Shrimp [Shellfish Allergy] Hives    Current Outpatient Medications  Medication Sig Dispense Refill   fluticasone  (FLONASE ) 50 MCG/ACT nasal spray 2 spray in each nostril Nasally Once a day     medroxyPROGESTERone  (PROVERA ) 10 MG tablet Take 1 tablet (10 mg total) by mouth daily. 10 tablet 0   No current facility-administered medications for this visit.    Review of Systems  Constitutional:  Negative for chills and fever.  HENT:  Negative for congestion, ear discharge, ear pain, hearing loss, sinus pain and sore throat.   Eyes:  Negative for blurred vision and double vision.  Respiratory:  Negative for cough, shortness of breath and wheezing.   Cardiovascular:  Negative for chest  pain, palpitations and leg swelling.  Gastrointestinal:  Negative for abdominal pain, blood in stool, constipation, diarrhea, heartburn, melena, nausea and vomiting.  Genitourinary:  Negative for dysuria, flank pain, frequency, hematuria and urgency.  Musculoskeletal:  Negative for back pain, joint pain and myalgias.  Skin:  Negative for itching and rash.  Neurological:  Negative for dizziness, tingling, tremors, sensory change, speech change, focal weakness, seizures, loss of consciousness, weakness and headaches.  Endo/Heme/Allergies:  Negative for environmental allergies. Does not bruise/bleed easily.       Positive for prolonged heavy menstrual  bleeding  Psychiatric/Behavioral:  Negative for depression, hallucinations, memory loss, substance abuse and suicidal ideas. The patient is not nervous/anxious and does not have insomnia.         Objective:  Objective   Vitals:   01/06/24 1111  BP: 123/78  Pulse: 88  Weight: 170 lb (77.1 kg)  Height: 5' 4 (1.626 m)   Body mass index is 29.18 kg/m. Constitutional: Well nourished, well developed female in no acute distress.  HEENT: normal Skin: Warm and dry.  Cardiovascular: Regular rate and rhythm.   Extremity: no edema  Respiratory:  Normal respiratory effort Abdomen: no masses, non tender Psych: Alert and Oriented x3. No memory deficits. Normal mood and affect.    Pelvic exam: (female chaperone present) is not limited by body habitus EGBUS: within normal limits Vagina: within normal limits and with normal mucosa, no blood in the vault Cervix: ectropion present and scarring from LEEP visible, friable  Informed and signed consent done. Endometrial biopsy: cervix cleansed with Hibiclens, tenaculum placed on anterior cervix, pipette inserted into cervix and without difficulty into uterus- sounding to 8 cm. Specimen collected and placed in collection container. Patient tolerated procedure well.   Assessment/Plan:     38 year old G4 P2113 female with abnormal uterine bleeding  Endometrial biopsy PAP smear Follow up as needed after labs result MD visit to discuss ablation as needed   Slater Rains, CNM [facility] 01/06/2024 1:14 PM

## 2024-01-06 NOTE — Patient Instructions (Signed)
 Endometrial Biopsy  An endometrial biopsy is a procedure where a tissue sample is removed from the lining of the uterus. This lining is called the endometrium. The tissue sample is then sent to a lab for testing. You may have this type of biopsy to check for: Cancer. Infection. Growths called polyps. Uterine bleeding that can't be explained. Tell a health care provider about: Any allergies you have. All medicines you're taking including vitamins, herbs, eye drops, creams, and over-the-counter medicines. Any problems you or family members have had with anesthesia. Any bleeding problems you have. Any surgeries you have had. Any medical problems you have. Whether you're pregnant or may be pregnant. What are the risks? Your health care provider will talk with you about risks. These may include: Bleeding. Infection. Allergic reactions to medicines. Damage to the wall of the uterus. This is rare. What happens before the procedure? Keep track of your period. You may need to have this biopsy when you're not having your period. Ask your provider about: Changing or stopping your regular medicines. These include any diabetes medicines or blood thinners you take. Taking medicines such as aspirin and ibuprofen. These medicines can thin your blood. Do not take them unless your provider tells you to. Taking over-the-counter medicines, vitamins, herbs, and supplements. Bring a pad with you. You may need to wear one after the biopsy. Plan to have a responsible adult take you home from the hospital or clinic. You won't be allowed to drive. What happens during the procedure? A tool will be put into your vagina to hold it open. This helps your provider see the cervix. The cervix is the lowest part of the uterus. Your cervix will be cleaned with a solution that kills germs. You will be given anesthesia. This keeps you from feeling pain. It will numb your cervix. A tool called forceps will be used to  hold your cervix steady. A thin tool called a uterine sound will be put through your cervix. It will be used to: Find the length of your uterus. Find where to take the sample from. A soft tube called a catheter will be put into your uterus. The catheter will remove a tissue sample. The tube and tools will be removed. The sample will be sent to a lab for testing. The procedure may vary among providers and hospitals. What happens after the procedure? Your blood pressure, heart rate, breathing rate, and blood oxygen level will be monitored until you leave the hospital or clinic. It's up to you to get the results of your procedure. Ask your provider, or the department that is doing the procedure, when your results will be ready. This information is not intended to replace advice given to you by your health care provider. Make sure you discuss any questions you have with your health care provider. Document Revised: 08/26/2022 Document Reviewed: 08/26/2022 Elsevier Patient Education  2024 [employer].

## 2024-01-13 ENCOUNTER — Encounter: Payer: Self-pay | Admitting: Advanced Practice Midwife

## 2024-01-13 DIAGNOSIS — N939 Abnormal uterine and vaginal bleeding, unspecified: Secondary | ICD-10-CM

## 2024-01-13 MED ORDER — MEDROXYPROGESTERONE ACETATE 5 MG PO TABS: 5.0000 mg | ORAL_TABLET | Freq: Three times a day (TID) | ORAL | 0 refills | Status: DC

## 2024-01-13 NOTE — Telephone Encounter (Signed)
 Called pt to let her know she needs to fill out a release of information form first allowing us  to complete this form and give the information to her employer. She will try to stop by today.

## 2024-01-13 NOTE — Telephone Encounter (Signed)
 TC to [facility], will increase dose of Provera  to 5mg  tid, follow up on results. Work note provided.

## 2024-01-14 ENCOUNTER — Telehealth: Payer: Self-pay | Admitting: Obstetrics and Gynecology

## 2024-01-14 DIAGNOSIS — C541 Malignant neoplasm of endometrium: Secondary | ICD-10-CM

## 2024-01-14 LAB — CYTOLOGY - PAP
Comment: NEGATIVE
Diagnosis: HIGH — AB
High risk HPV: NEGATIVE

## 2024-01-14 NOTE — Telephone Encounter (Signed)
 Pt called to discuss EMB results due to AUB/thickened EM. Slater Rains, CNM is not in the office this wk. I spoke to pt with endometrial cancer dx on EMB. Referral to gyn onc. Pt aware, questions answered. Can stop hormones as they won't help with bleeding.

## 2024-01-18 ENCOUNTER — Other Ambulatory Visit

## 2024-01-20 ENCOUNTER — Inpatient Hospital Stay

## 2024-01-20 ENCOUNTER — Other Ambulatory Visit: Payer: Self-pay | Admitting: Obstetrics and Gynecology

## 2024-01-20 ENCOUNTER — Inpatient Hospital Stay: Attending: Obstetrics and Gynecology | Admitting: Obstetrics and Gynecology

## 2024-01-20 VITALS — BP 129/88 | HR 89 | Temp 98.3°F | Resp 20 | Wt 168.9 lb

## 2024-01-20 DIAGNOSIS — Z79890 Hormone replacement therapy (postmenopausal): Secondary | ICD-10-CM | POA: Diagnosis not present

## 2024-01-20 DIAGNOSIS — E785 Hyperlipidemia, unspecified: Secondary | ICD-10-CM | POA: Insufficient documentation

## 2024-01-20 DIAGNOSIS — C541 Malignant neoplasm of endometrium: Secondary | ICD-10-CM | POA: Diagnosis not present

## 2024-01-20 DIAGNOSIS — N939 Abnormal uterine and vaginal bleeding, unspecified: Secondary | ICD-10-CM | POA: Insufficient documentation

## 2024-01-20 NOTE — Progress Notes (Signed)
 Met with patient to review pre-operative teaching for planned surgery scheduled for February 10, 2024. Pre-admit phone screen appointment discussed. Teaching included but not limited to labs, bowel prep and dietary restrictions, surgical location, pain scales/management, diligent peri care, guidelines to prevent constipation, and importance of early ambulation post operatively and follow-up care. Written instructions for pre-op and post-op care, contact information for questions and concerns provided to patient. Patient able to repeat instructions to nursing staff. No further questions at this time. Patient agrees with plan to proceed with scheduled procedure.

## 2024-01-20 NOTE — Patient Instructions (Signed)
 Your surgery will be scheduled February 10, 2024 You will have a pre-admission telephone visit prior to surgery. This visit will take around 40-45 minutes. Gyn oncology will see you 4-6 weeks following surgery for a post operative visit.                      Laparoscopy Laparoscopy is a procedure to diagnose diseases in the abdomen. During the procedure, a thin, lighted, pencil-sized instrument called a laparoscope is inserted into the abdomen through an incision. The laparoscope allows your health care provider to look at the organs inside your body. LET [facility] CARE PROVIDER KNOW ABOUT: Any allergies you have. All medicines you are taking, including vitamins, herbs, eye drops, creams, and over-the-counter medicines. Previous problems you or members of your family have had with the use of anesthetics. Any blood disorders you have. Previous surgeries you have had. Medical conditions you have. RISKS AND COMPLICATIONS  Generally, this is a safe procedure. However, problems can occur, which may include: Infection. Bleeding. Damage to other organs. Allergic reaction to the anesthetics used during the procedure. BEFORE THE PROCEDURE Do not eat or drink anything after midnight on the night before the procedure or as directed by your health care provider. Ask your health care provider about: Changing or stopping your regular medicines. Taking medicines such as aspirin and ibuprofen . These medicines can thin your blood. Do not take these medicines before your procedure if your health care provider instructs you not to. Plan to have someone take you home after the procedure. PROCEDURE You may be given a medicine to help you relax (sedative). You will be given a medicine to make you sleep (general anesthetic). Your abdomen will be inflated with a gas. This will make your organs easier to see. Small incisions will be made in your abdomen. A laparoscope and other small instruments will be  inserted into the abdomen through the incisions. A tissue sample may be removed from an organ in the abdomen for examination. The instruments will be removed from the abdomen. The gas will be released. The incisions will be closed with stitches (sutures). AFTER THE PROCEDURE  Your blood pressure, heart rate, breathing rate, and blood oxygen level will be monitored often until the medicines you were given have worn off.   This information is not intended to replace advice given to you by your health care provider. Make sure you discuss any questions you have with your health care provider.                                             Bowel Symptoms After Surgery After gynecologic surgery, women often have temporary changes in bowel function (constipation and gas pain).  Following are tips to help prevent and treat common bowel problems.  It also tells you when to call the doctor.  This is important because some symptoms might be a sign of a more serious bowel problem such as obstruction (bowel blockage).  These problems are rare but can happen after gynecologic surgery.   Besides surgery, what can temporarily affect bowel function? 1. Dietary changes   2. Decreased physical activity   3.Antibiotics   4. Pain medication   How can I prevent constipation (three days or more without a stool)? Include fiber in your diet: whole grains, raw or dried fruits & vegetables, prunes, prune/pear  juiceDrink at least 8 glasses of liquid (preferably water) every day Avoid: Gas forming foods such as broccoli, beans, peas, salads, cabbage, sweet potatoes Greasy, fatty, or fried foods Activity helps bowel function return to normal, walk around the house at least 3-4 times each day for 15 minutes or longer, if tolerated.  Rocking in a rocking chair is preferable to sitting still. Stool softeners: these are not laxatives, but serve to soften the stool to avoid straining.  Take 2-4 times a day until normal bowel  function returns         Examples: Colace or generic equivalent (Docusate) Bulk laxatives: provide a concentrated source of fiber.  They do not stimulate the bowel.  Take 1-2 times each day until normal bowel function return.              Examples: Citrucel, Metamucil, Fiberal, Fibercon   What can I take for "Gas Pains"? Simethicone  (Mylicon, Gas-X, Maalox-Gas, Mylanta-Gas) take 3-4 times a day Maalox Regular - take 3-4 times a day Mylanta Regular - take 3-4 times a day   What can I take if I become constipated? Start with stool softeners and add additional laxatives below as needed to have a bowel movement every 1-2 days  Stool softeners 1-2 tablets, 2 times a day Senokot 1-2 tablets, 1-2 times a day Glycerin  suppository can soften hard stool take once a day Bisacodyl  suppository once a day  Milk of Magnesia 30 mL 1-2 times a day Fleets or tap water enema    What can I do for nausea?  Limit most solid foods for 24-48 hours Continue eating small frequent amounts of liquids and/or bland soft foods Toast, crackers, cooked cereal (grits, cream of wheat, rice) Benadryl : a mild anti-nausea medicine can be obtained without a prescription. May cause drowsiness, especially if taken with narcotic pain medicines Contact provider for prescription nausea medication     What can I do, or take for diarrhea (more than five loose stools per day)? Drink plenty of clear fluids to prevent dehydration May take Kaopectate, Pepto-Bismol, Imodium, or probiotics for 1-2 days Anusol  or Preparation-H can be helpful for hemorrhoids and irritated tissue around anus   When should I call the doctor?             CONSTIPATION:  Not relieved after three days following the above program VOMITING: That contains blood, "coffee ground" material More the three times/hour and unable to keep down nausea medication for more than eight hours With dry mouth, dark or strong urine, feeling light-headed, dizzy, or  confused With severe abdominal pain or bloating for more than 24 hours DIARRHEA: That continues for more then 24-48 hours despite treatment That contains blood or tarry material With dry mouth, dark or strong urine, feeling light~headed, dizzy, or confused FEVER: 101 F or higher along with nausea, vomiting, gas pain, diarrhea UNABLE TO: Pass gas from rectum for more than 24 hours Tolerate liquids by mouth for more than 24 hours        Laparoscopic Hysterectomy, Care After Refer to this sheet in the next few weeks. These instructions provide you with information on caring for yourself after your procedure. Your health care provider may also give you more specific instructions. Your treatment has been planned according to current medical practices, but problems sometimes occur. Call your health care provider if you have any problems or questions after your procedure. What can I expect after the procedure? Pain and bruising at the incision sites. You will  be given pain medicine to control it. Menopausal symptoms such as hot flashes, night sweats, and insomnia if your ovaries were removed. Sore throat from the breathing tube that was inserted during surgery. Follow these instructions at home: Only take over-the-counter or prescription medicines for pain, discomfort, or fever as directed by your health care provider. Do not take aspirin. It can cause bleeding. Do not drive when taking pain medicine. Follow your health care provider's advice regarding diet, exercise, lifting, driving, and general activities. Resume your usual diet as directed and allowed. Get plenty of rest and sleep. Do not douche, use tampons, or have sexual intercourse for at least 6 weeks, or until your health care provider gives you permission. Change your bandages (dressings) as directed by your health care provider. Monitor your temperature and notify your health care provider of a fever. Take showers instead of baths  for 2-3 weeks. Do not drink alcohol until your health care provider gives you permission. If you develop constipation, you may take a mild laxative with your health care provider's permission. Bran foods may help with constipation problems. Drinking enough fluids to keep your urine clear or pale yellow may help as well. Try to have someone home with you for 1-2 weeks to help around the house. Keep all of your follow-up appointments as directed by your health care provider. Contact a health care provider if: You have swelling, redness, or increasing pain around your incision sites. You have pus coming from your incision. You notice a bad smell coming from your incision. Your incision breaks open. You feel dizzy or lightheaded. You have pain or bleeding when you urinate. You have persistent diarrhea. You have persistent nausea and vomiting. You have abnormal vaginal discharge. You have a rash. You have any type of abnormal reaction or develop an allergy to your medicine. You have poor pain control with your prescribed medicine. Get help right away if: You have chest pain or shortness of breath. You have severe abdominal pain that is not relieved with pain medicine. You have pain or swelling in your legs. This information is not intended to replace advice given to you by your health care provider. Make sure you discuss any questions you have with your health care provider. Document Released: 04/06/2013 Document Revised: 11/22/2015 Document Reviewed: 01/04/2013 Elsevier Interactive Patient Education  2017 [employer].
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
## 2024-01-21 LAB — SURGICAL PATHOLOGY

## 2024-01-27 ENCOUNTER — Ambulatory Visit
Admission: RE | Admit: 2024-01-27 | Discharge: 2024-01-27 | Disposition: A | Discharge status: Home / Self Care | Source: Ambulatory Visit | Attending: Obstetrics and Gynecology | Admitting: Obstetrics and Gynecology

## 2024-01-27 ENCOUNTER — Ambulatory Visit

## 2024-01-27 DIAGNOSIS — C541 Malignant neoplasm of endometrium: Secondary | ICD-10-CM | POA: Insufficient documentation

## 2024-01-27 MED ORDER — IOHEXOL 300 MG/ML  SOLN
100.0000 mL | Freq: Once | INTRAMUSCULAR | Status: AC | PRN
  Administered 2024-01-27: 100 mL via INTRAVENOUS

## 2024-01-29 NOTE — Progress Notes (Signed)
 Met with patient to review pre-operative teaching for planned surgery scheduled for 02/02/24 . Pre-anesthesia screening appointment at [facility] discussed. Teaching included but not limited to labs, bowel prep and dietary restrictions, surgical location, parking, transportation to and from hospital, pain scales/management, diligent peri care, guidelines to prevent constipation, and importance of early ambulation post operatively and follow-up care. Written instructions for pre-op and post-op care, contact information for questions and concerns provided to patient. Patient able to repeat instructions to nursing staff. No further questions at this time. Patient agrees with plan to proceed with scheduled procedure. Perioperative pain assessment (do not use free text for responses) Patient perceived anxiety about surgery: 90. (0-100 scale; 0 not anxious, 100 extremely anxious) Patient anticipation of pain medication needed following scheduled surgery: 2 - average Highest level of education: Some college

## 2024-02-04 ENCOUNTER — Other Ambulatory Visit: Payer: Self-pay | Admitting: Certified Nurse Midwife

## 2024-02-04 DIAGNOSIS — N939 Abnormal uterine and vaginal bleeding, unspecified: Secondary | ICD-10-CM

## 2024-02-24 ENCOUNTER — Ambulatory Visit

## 2024-03-02 ENCOUNTER — Inpatient Hospital Stay: Attending: Obstetrics and Gynecology | Admitting: Obstetrics and Gynecology

## 2024-03-02 ENCOUNTER — Inpatient Hospital Stay

## 2024-03-02 VITALS — BP 116/80 | HR 80 | Temp 96.9°F | Resp 20 | Wt 154.3 lb

## 2024-03-02 DIAGNOSIS — Z7189 Other specified counseling: Secondary | ICD-10-CM

## 2024-03-02 DIAGNOSIS — E8941 Symptomatic postprocedural ovarian failure: Secondary | ICD-10-CM | POA: Diagnosis not present

## 2024-03-02 DIAGNOSIS — Z90722 Acquired absence of ovaries, bilateral: Secondary | ICD-10-CM | POA: Diagnosis not present

## 2024-03-02 DIAGNOSIS — C541 Malignant neoplasm of endometrium: Secondary | ICD-10-CM

## 2024-03-02 DIAGNOSIS — Z5111 Encounter for antineoplastic chemotherapy: Secondary | ICD-10-CM | POA: Diagnosis present

## 2024-03-02 DIAGNOSIS — Z90710 Acquired absence of both cervix and uterus: Secondary | ICD-10-CM | POA: Diagnosis not present

## 2024-03-02 DIAGNOSIS — E28319 Asymptomatic premature menopause: Secondary | ICD-10-CM

## 2024-03-02 DIAGNOSIS — Z9079 Acquired absence of other genital organ(s): Secondary | ICD-10-CM

## 2024-03-02 LAB — CBC WITH DIFFERENTIAL/PLATELET
Abs Immature Granulocytes: 0.03 K/uL (ref 0.00–0.07)
Basophils Absolute: 0 K/uL (ref 0.0–0.1)
Basophils Relative: 0 %
Eosinophils Absolute: 0.4 K/uL (ref 0.0–0.5)
Eosinophils Relative: 8 %
HCT: 34.7 % — ABNORMAL LOW (ref 36.0–46.0)
Hemoglobin: 11.7 g/dL — ABNORMAL LOW (ref 12.0–15.0)
Immature Granulocytes: 1 %
Lymphocytes Relative: 23 %
Lymphs Abs: 1.2 K/uL (ref 0.7–4.0)
MCH: 32.8 pg (ref 26.0–34.0)
MCHC: 33.7 g/dL (ref 30.0–36.0)
MCV: 97.2 fL (ref 80.0–100.0)
Monocytes Absolute: 0.4 K/uL (ref 0.1–1.0)
Monocytes Relative: 7 %
Neutro Abs: 3.3 K/uL (ref 1.7–7.7)
Neutrophils Relative %: 61 %
Platelets: 262 K/uL (ref 150–400)
RBC: 3.57 MIL/uL — ABNORMAL LOW (ref 3.87–5.11)
RDW: 12 % (ref 11.5–15.5)
WBC: 5.3 K/uL (ref 4.0–10.5)
nRBC: 0 % (ref 0.0–0.2)

## 2024-03-02 LAB — COMPREHENSIVE METABOLIC PANEL WITH GFR
ALT: 43 U/L (ref 0–44)
AST: 20 U/L (ref 15–41)
Albumin: 4.3 g/dL (ref 3.5–5.0)
Alkaline Phosphatase: 126 U/L (ref 38–126)
Anion gap: 9 (ref 5–15)
BUN: 14 mg/dL (ref 6–20)
CO2: 25 mmol/L (ref 22–32)
Calcium: 9.4 mg/dL (ref 8.9–10.3)
Chloride: 104 mmol/L (ref 98–111)
Creatinine, Ser: 0.61 mg/dL (ref 0.44–1.00)
GFR, Estimated: >60 mL/min (ref >60)
Glucose, Bld: 109 mg/dL — ABNORMAL HIGH (ref 70–99)
Potassium: 3.8 mmol/L (ref 3.5–5.1)
Sodium: 138 mmol/L (ref 135–145)
Total Bilirubin: 0.6 mg/dL (ref 0.0–1.2)
Total Protein: 7.1 g/dL (ref 6.5–8.1)

## 2024-03-02 LAB — GENETIC SCREENING ORDER

## 2024-03-02 MED ORDER — VEOZAH 45 MG PO TABS: 45.0000 mg | ORAL_TABLET | Freq: Every day | ORAL | 2 refills | Status: DC
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
## 2024-03-03 ENCOUNTER — Telehealth: Payer: Self-pay | Admitting: Nurse Practitioner

## 2024-03-03 ENCOUNTER — Other Ambulatory Visit: Payer: Self-pay | Admitting: *Deleted

## 2024-03-03 ENCOUNTER — Encounter: Payer: Self-pay | Admitting: Oncology

## 2024-03-03 NOTE — Telephone Encounter (Signed)
 Called pt to schedule CT - left vm to call back - LH

## 2024-03-04 ENCOUNTER — Encounter: Payer: Self-pay | Admitting: Oncology

## 2024-03-04 ENCOUNTER — Inpatient Hospital Stay

## 2024-03-04 ENCOUNTER — Inpatient Hospital Stay (HOSPITAL_BASED_OUTPATIENT_CLINIC_OR_DEPARTMENT_OTHER): Admitting: Oncology

## 2024-03-04 VITALS — BP 117/83 | HR 103 | Temp 98.8°F | Resp 18 | Ht 64.0 in | Wt 152.9 lb

## 2024-03-04 DIAGNOSIS — C541 Malignant neoplasm of endometrium: Secondary | ICD-10-CM

## 2024-03-04 DIAGNOSIS — Z5111 Encounter for antineoplastic chemotherapy: Secondary | ICD-10-CM | POA: Diagnosis not present

## 2024-03-04 DIAGNOSIS — Z7189 Other specified counseling: Secondary | ICD-10-CM | POA: Diagnosis not present

## 2024-03-04 MED ORDER — ONDANSETRON HCL 8 MG PO TABS: 8.0000 mg | ORAL_TABLET | Freq: Three times a day (TID) | ORAL | 1 refills | Status: DC | PRN

## 2024-03-04 MED ORDER — LIDOCAINE-PRILOCAINE 2.5-2.5 % EX CREA: TOPICAL_CREAM | CUTANEOUS | 3 refills | Status: AC

## 2024-03-04 MED ORDER — PROCHLORPERAZINE MALEATE 10 MG PO TABS
10.0000 mg | ORAL_TABLET | Freq: Four times a day (QID) | ORAL | 1 refills | Status: DC | PRN
Start: 2024-03-04 — End: 2024-03-07

## 2024-03-04 MED ORDER — DEXAMETHASONE 4 MG PO TABS: ORAL_TABLET | ORAL | 1 refills | Status: AC

## 2024-03-04 NOTE — Addendum Note (Signed)
 Addended by: Krishika Bugge G on: 03/04/2024 02:56 PM   Modules accepted: Orders

## 2024-03-04 NOTE — Progress Notes (Signed)
 START ON PATHWAY REGIMEN - Uterine     A cycle is every 21 days:     Paclitaxel      Carboplatin   **Always confirm dose/schedule in your pharmacy ordering system**  Patient Characteristics: Serous Carcinoma, Newly Diagnosed, Postoperative (Pathologic Staging), Stage I/II Histology: Serous Carcinoma Therapeutic Status: Newly Diagnosed, Postoperative (Pathologic Staging) AJCC M Category: cM0 AJCC 8 Stage Grouping: IA AJCC T Category: pT1a AJCC N Category: pN0 Intent of Therapy: Curative Intent, Discussed with Patient

## 2024-03-04 NOTE — Progress Notes (Signed)
 Hematology/Oncology Consult note [facility] Telephone:([phone] [phone]  Patient Care Team: [facility] as PCP - General Maurie Rayfield BIRCH, RN as Oncology Nurse Navigator Lenn Aran, MD as Consulting Physician (Radiation Oncology) Melanee Annah BROCKS, MD as Medical Oncologist (Oncology)   Name of the patient: Cathy Bray  [id number]  [birth year]    Reason for referral-new diagnosis of endometrial cancer   Referring physician-Dr. Mancil  Date of visit: 03/04/24   History of presenting illness- Patient is a 38 year old female who was seen by GYN Dr. Leonce and subsequently seen by GYN oncology for concerns of endometrial cancer.  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  IHC showed tumor cells positive for PAX8 and ER while negative for Napsin A p53 p16 Calretinin TTF-1 and WT1.  Also noted to have high-grade SIL on Pap.    She underwent TAH/BSO sentinel lymph node biopsy omentectomy on 02/02/2024.  She was noted to have multiple loops of ileum and ileal mesentery adherent to the anterior abdominal wall uterine fundus and pelvic sidewall.  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  4.5 cm.  Myometrial invasion absent.  Lymphatic invasion absent.  Cervix and serosa negative for malignancy.  Left and right ovary and fallopian tube negative for malignancy.  2 pelvic lymph nodes on the left and 5 on the right negative for malignancy.  Omentum resection showed benign fibroadipose tissue.  Patient seen by GYN oncology.  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 Denies other complaints  ECOG PS- 0  Pain scale- 0   Review of systems- Review of Systems  Constitutional:   Negative for chills, fever, malaise/fatigue and weight loss.  HENT:  Negative for congestion, ear discharge and nosebleeds.   Eyes:  Negative for blurred vision.  Respiratory:  Negative for cough, hemoptysis, sputum production, shortness of breath and wheezing.   Cardiovascular:  Negative for chest pain, palpitations, orthopnea and claudication.  Gastrointestinal:  Negative for abdominal pain, blood in stool, constipation, diarrhea, heartburn, melena, nausea and vomiting.  Genitourinary:  Negative for dysuria, flank pain, frequency, hematuria and urgency.  Musculoskeletal:  Negative for back pain, joint pain and myalgias.  Skin:  Negative for rash.  Neurological:  Negative for dizziness, tingling, focal weakness, seizures, weakness and headaches.  Endo/Heme/Allergies:  Does not bruise/bleed easily.  Psychiatric/Behavioral:  Negative for depression and suicidal ideas. The patient does not have insomnia.     Allergies  Allergen Reactions   Shrimp Extract    Shrimp [Shellfish Allergy] Hives    Patient Active Problem List   Diagnosis Date Noted   Counseling and coordination of care 03/02/2024   Endometrial cancer (HCC) 01/20/2024   Partial small bowel obstruction (HCC) 02/06/2021   Elevated ALT measurement 12/28/2020   Dyslipidemia with elevated low density lipoprotein (LDL) cholesterol and abnormally low high density lipoprotein cholesterol 12/28/2020   History of dysmenorrhea 05/17/2015   Cervical dysplasia 12/15/2014   History of loop electrical excision procedure (LEEP) 12/15/2014     Past Medical History:  Diagnosis Date   Dysplasia of cervix    Hematuria    History of LEEP (loop electrosurgical excision procedure) of cervix complicating pregnancy    Infertility, female    Missed ab    Nausea and vomiting during pregnancy  Ovarian cyst    LEFT- 2.4CM    S/P urgent classical cesarean section 01/08/2015   Done for malpresentation (fetal arm in vagina) at [redacted]w[redacted]d s/p PPROM      Second trimester bleeding      Past Surgical History:  Procedure Laterality Date   CESAREAN SECTION N/A 01/08/2015   Procedure: CESAREAN SECTION;  Surgeon: Gloris DELENA Hugger, MD;  Location: [facility];  Service: Obstetrics;  Laterality: N/A;  classical on uterus    LAPAROTOMY N/A 01/23/2015   Procedure: EXPLORATORY LAPAROTOMY;  Surgeon: Gladis DELENA Dollar, MD;  Location: [facility] ORS;  Service: Gynecology;  Laterality: N/A;   LEEP  2012   [facility]    Social History   Socioeconomic History   Marital status: Married    Spouse name: Not on file   Number of children: Not on file   Years of education: Not on file   Highest education level: Not on file  Occupational History   Not on file  Tobacco Use   Smoking status: Never   Smokeless tobacco: Never  Vaping Use   Vaping status: Never Used  Substance and Sexual Activity   Alcohol use: No   Drug use: No   Sexual activity: Yes    Birth control/protection: None  Other Topics Concern   Not on file  Social History Narrative   Not on file   Social Drivers of Health   Financial Resource Strain: Low Risk  (02/08/2024)   Received from [facility]   Overall Financial Resource Strain (CARDIA)    Difficulty of Paying Living Expenses: Not hard at all  Food Insecurity: No Food Insecurity (02/08/2024)   Received from [facility]   Hunger Vital Sign    Within the past 12 months, you worried that your food would run out before you got the money to buy more.: Never true    Within the past 12 months, the food you bought just didn't last and you didn't have money to get more.: Never true  Transportation Needs: No Transportation Needs (02/08/2024)   Received from [facility] - Transportation    In the past 12 months, has lack of transportation kept you from medical appointments or from getting medications?: No    Lack of Transportation (Non-Medical): No  Physical Activity: Sufficiently  Active (11/12/2017)   Exercise Vital Sign    Days of Exercise per Week: 5 days    Minutes of Exercise per Session: 30 min  Stress: Not on file  Social Connections: Not on file  Intimate Partner Violence: Not At Risk (01/20/2024)   Humiliation, Afraid, Rape, and Kick questionnaire    Fear of Current or Ex-Partner: No    Emotionally Abused: No    Physically Abused: No    Sexually Abused: No     Family History  Problem Relation Age of Onset   Hypertension Mother    Diabetes Father    Heart disease Neg Hx    Breast cancer Neg Hx    Colon cancer Neg Hx    Ovarian cancer Neg Hx      Current Outpatient Medications:    acetaminophen  (TYLENOL ) 325 MG tablet, Take 650 mg by mouth., Disp: , Rfl:    Fezolinetant  (VEOZAH ) 45 MG TABS, Take 1 tablet (45 mg total) by mouth daily., Disp: 30 tablet, Rfl: 2   fluticasone  (FLONASE ) 50 MCG/ACT nasal spray, 2 spray in each nostril Nasally Once a day, Disp: , Rfl:  ibuprofen  (ADVIL ) 600 MG tablet, Take 600 mg by mouth every 6 (six) hours as needed., Disp: , Rfl:    medroxyPROGESTERone  (PROVERA ) 5 MG tablet, Take 1 tablet (5 mg total) by mouth 3 (three) times daily., Disp: 90 tablet, Rfl: 0   Physical exam:  Vitals:   03/04/24 1442  BP: 117/83  Pulse: (!) 103  Resp: 18  Temp: 98.8 F (37.1 C)  TempSrc: Tympanic  SpO2: 100%  Weight: 152 lb 14.4 oz (69.4 kg)  Height: 5' 4 (1.626 m)   Physical Exam Cardiovascular:     Rate and Rhythm: Normal rate and regular rhythm.     Heart sounds: Normal heart sounds.  Pulmonary:     Effort: Pulmonary effort is normal.     Breath sounds: Normal breath sounds.  Abdominal:     General: Bowel sounds are normal.     Palpations: Abdomen is soft.     Comments: Midline surgical incision healing well  Skin:    General: Skin is warm and dry.  Neurological:     Mental Status: She is alert and oriented to person, place, and time.           Latest Ref Rng & Units 03/02/2024   12:37 PM  CMP  Glucose  70 - 99 mg/dL 890   BUN 6 - 20 mg/dL 14   Creatinine 9.55 - 1.00 mg/dL 9.38   Sodium 864 - 854 mmol/L 138   Potassium 3.5 - 5.1 mmol/L 3.8   Chloride 98 - 111 mmol/L 104   CO2 22 - 32 mmol/L 25   Calcium  8.9 - 10.3 mg/dL 9.4   Total Protein 6.5 - 8.1 g/dL 7.1   Total Bilirubin 0.0 - 1.2 mg/dL 0.6   Alkaline Phos 38 - 126 U/L 126   AST 15 - 41 U/L 20   ALT 0 - 44 U/L 43       Latest Ref Rng & Units 03/02/2024   12:37 PM  CBC  WBC 4.0 - 10.5 K/uL 5.3   Hemoglobin 12.0 - 15.0 g/dL 88.2   Hematocrit 63.9 - 46.0 % 34.7   Platelets 150 - 400 K/uL 262      Assessment and plan- Patient is a 38 y.o. 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  Patient was found to have high-grade serous carcinoma grade 3 that was confined to the uterus 4.5 cm.  No evidence of lower uterine segment, serosa, cervical involvement.  No myometrial invasion identified.  7 pelvic lymph nodes negative for malignancy.  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  Discussed risks and benefits of chemotherapy including all but not limited to nausea vomiting low blood counts risk of infections and hospitalization.  Risks of infusion reaction peripheral neuropathy and hair loss will plan to give her carboplatin  AUC 6 along with Taxol  at 175 mg/m IV every 3 weeks  We will plan to do chemo without port placement. Chemo teach. Plan to start chemo in 2 weeks time   Thank you for this kind referral and the opportunity to participate in the care of this  Patient   Visit Diagnosis 1. Endometrial cancer (HCC)  2.  Goals of care, counseling/discussion     Dr. Annah Skene, MD, MPH [facility] [phone] 03/04/2024

## 2024-03-04 NOTE — Progress Notes (Signed)
 New patient; here for urgent endometrial cancer.

## 2024-03-05 ENCOUNTER — Encounter: Payer: Self-pay | Admitting: Oncology

## 2024-03-05 ENCOUNTER — Other Ambulatory Visit: Payer: Self-pay

## 2024-03-07 LAB — SURGICAL PATHOLOGY

## 2024-03-07 NOTE — Progress Notes (Signed)
 Pharmacist Chemotherapy Monitoring - Initial Assessment    Anticipated start date: 03/11/24   The following has been reviewed per standard work regarding the patient's treatment regimen: The patient's diagnosis, treatment plan and drug doses, and organ/hematologic function Lab orders and baseline tests specific to treatment regimen  The treatment plan start date, drug sequencing, and pre-medications Prior authorization status  Patient's documented medication list, including drug-drug interaction screen and prescriptions for anti-emetics and supportive care specific to the treatment regimen The drug concentrations, fluid compatibility, administration routes, and timing of the medications to be used The patient's access for treatment and lifetime cumulative dose history, if applicable  The patient's medication allergies and previous infusion related reactions, if applicable    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

## 2024-03-08 ENCOUNTER — Inpatient Hospital Stay

## 2024-03-08 ENCOUNTER — Encounter: Payer: Self-pay | Admitting: Licensed Clinical Social Worker

## 2024-03-08 DIAGNOSIS — Z1379 Encounter for other screening for genetic and chromosomal anomalies: Secondary | ICD-10-CM | POA: Insufficient documentation

## 2024-03-08 DIAGNOSIS — C541 Malignant neoplasm of endometrium: Secondary | ICD-10-CM

## 2024-03-08 NOTE — Progress Notes (Signed)
 [facility] CSW Progress Note  Clinical Social Work introduced self to patient during Patient Education with Raoul Moats, Charity fundraiser.  Provided information regarding CSW role, including counseling, advanced care planning and support group.  Answered questions as needed.  Follow Up Plan:  CSW will follow-up with patient by phone     Macario CHRISTELLA Au, LCSW Clinical Social Worker [facility]

## 2024-03-09 ENCOUNTER — Encounter: Payer: Self-pay | Admitting: Oncology

## 2024-03-09 ENCOUNTER — Ambulatory Visit
Admission: RE | Admit: 2024-03-09 | Discharge: 2024-03-09 | Disposition: A | Discharge status: Home / Self Care | Source: Ambulatory Visit | Attending: Radiation Oncology | Admitting: Radiation Oncology

## 2024-03-09 ENCOUNTER — Encounter: Payer: Self-pay | Admitting: Radiation Oncology

## 2024-03-09 VITALS — BP 128/85 | HR 88 | Temp 98.4°F | Resp 16 | Wt 154.0 lb

## 2024-03-09 DIAGNOSIS — R109 Unspecified abdominal pain: Secondary | ICD-10-CM | POA: Insufficient documentation

## 2024-03-09 DIAGNOSIS — Z90710 Acquired absence of both cervix and uterus: Secondary | ICD-10-CM | POA: Insufficient documentation

## 2024-03-09 DIAGNOSIS — C541 Malignant neoplasm of endometrium: Secondary | ICD-10-CM | POA: Insufficient documentation

## 2024-03-09 DIAGNOSIS — Z79899 Other long term (current) drug therapy: Secondary | ICD-10-CM | POA: Insufficient documentation

## 2024-03-09 DIAGNOSIS — Z7952 Long term (current) use of systemic steroids: Secondary | ICD-10-CM | POA: Diagnosis not present

## 2024-03-09 DIAGNOSIS — Z90722 Acquired absence of ovaries, bilateral: Secondary | ICD-10-CM | POA: Insufficient documentation

## 2024-03-09 DIAGNOSIS — N83209 Unspecified ovarian cyst, unspecified side: Secondary | ICD-10-CM | POA: Diagnosis not present

## 2024-03-09 DIAGNOSIS — R918 Other nonspecific abnormal finding of lung field: Secondary | ICD-10-CM | POA: Insufficient documentation

## 2024-03-09 NOTE — Consult Note (Signed)
 NEW PATIENT EVALUATION  Name: Cathy Bray  MRN: [id number]  Date:   03/09/2024     DOB: 04/20/1986   This 38 y.o. 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  REFERRING PHYSICIAN: Dasie Tinnie MATSU, NP  CHIEF COMPLAINT: No chief complaint on file.   DIAGNOSIS: The encounter diagnosis was Endometrial cancer (HCC).   PREVIOUS INVESTIGATIONS:  CT chest abdomen pelvis removed Pathology reports reviewed Clinical notes reviewed  HPI: Patient is a 38 year old female who presented with vaginal bleeding.  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  She underwent a TAH/BSO with sentinel lymph node and omentectomy on 02/02/2024.  Pathology showed high-grade serous carcinoma with clear cell features the endometrium 4.5 cm but myometrial invasion was absent lymphatic invasion was present cervix was negative.  2 pelvic lymph nodes on the left and 5 in the right all negative for malignancy.  Omental resection showed benign fibroadipose post tissue.  Initial CT scan showed thickening of the cervix with stranding with some enhancement and regular fluid density in the endocervical region extending into the endometrium there are a few sub-5 mm lung nodules identified.  Based on her high-grade histology recommendation for 3 cycles of carboplatinum Taxol  plus vaginal break brachytherapy was made.  She is seen today for radiation oncology consultation.  She is already been established with medical oncology and is planning the 3 cycles of CarboTaxol.  She is doing well still has some abdominal pain and discomfort.  Bowel function is normal no increased lower urinary tract symptoms.  PLANNED TREATMENT REGIMEN: CarboTaxol plus vaginal brachii  PAST MEDICAL HISTORY:  has a past medical history of Dysplasia of cervix, Hematuria, History of LEEP (loop  electrosurgical excision procedure) of cervix complicating pregnancy, Infertility, female, Missed ab, Nausea and vomiting during pregnancy, Ovarian cyst, S/P urgent classical cesarean section (01/08/2015), and Second trimester bleeding.    PAST SURGICAL HISTORY:  Past Surgical History:  Procedure Laterality Date   CESAREAN SECTION N/A 01/08/2015   Procedure: CESAREAN SECTION;  Surgeon: Gloris DELENA Hugger, MD;  Location: [facility] ORS;  Service: Obstetrics;  Laterality: N/A;  classical on uterus    LAPAROTOMY N/A 01/23/2015   Procedure: EXPLORATORY LAPAROTOMY;  Surgeon: Gladis DELENA Dollar, MD;  Location: [facility] ORS;  Service: Gynecology;  Laterality: N/A;   LEEP  2012   [facility]    FAMILY HISTORY: family history includes Diabetes in her father; Hypertension in her mother.  SOCIAL HISTORY:  reports that she has never smoked. She has never used smokeless tobacco. She reports that she does not drink alcohol and does not use drugs.  ALLERGIES: Prochlorperazine , Shrimp extract, and Shrimp [shellfish allergy]  MEDICATIONS:  Current Outpatient Medications  Medication Sig Dispense Refill   acetaminophen  (TYLENOL ) 325 MG tablet Take 650 mg by mouth.     dexamethasone  (DECADRON ) 4 MG tablet Take 2 tablets (8 mg total) by mouth daily for 3 days. Start the day after chemotherapy. Take with food. 30 tablet 1   Fezolinetant  (VEOZAH ) 45 MG TABS Take 1 tablet (45 mg total) by mouth daily. 30 tablet 2   fluticasone  (FLONASE ) 50 MCG/ACT nasal spray 2 spray in each nostril Nasally Once a day     ibuprofen  (ADVIL ) 600 MG tablet Take 600 mg by mouth every 6 (six) hours as needed.     lidocaine -prilocaine  (EMLA ) cream Apply to affected area once 30 g 3  ondansetron  (ZOFRAN ) 4 MG tablet Take 4 mg by mouth.     ondansetron  (ZOFRAN ) 8 MG tablet Take 1 tablet (8 mg total) by mouth every 8 (eight) hours as needed for nausea or vomiting. Start on the third day after chemotherapy. 30 tablet 1   senna-docusate (SENOKOT-S) 8.6-50 MG  tablet Take 2 tablets by mouth.     No current facility-administered medications for this encounter.    ECOG PERFORMANCE STATUS:  0 - Asymptomatic  REVIEW OF SYSTEMS: Patient denies any weight loss, fatigue, weakness, fever, chills or night sweats. Patient denies any loss of vision, blurred vision. Patient denies any ringing  of the ears or hearing loss. No irregular heartbeat. Patient denies heart murmur or history of fainting. Patient denies any chest pain or pain radiating to her upper extremities. Patient denies any shortness of breath, difficulty breathing at night, cough or hemoptysis. Patient denies any swelling in the lower legs. Patient denies any nausea vomiting, vomiting of blood, or coffee ground material in the vomitus. Patient denies any stomach pain. Patient states has had normal bowel movements no significant constipation or diarrhea. Patient denies any dysuria, hematuria or significant nocturia. Patient denies any problems walking, swelling in the joints or loss of balance. Patient denies any skin changes, loss of hair or loss of weight. Patient denies any excessive worrying or anxiety or significant depression. Patient denies any problems with insomnia. Patient denies excessive thirst, polyuria, polydipsia. Patient denies any swollen glands, patient denies easy bruising or easy bleeding. Patient denies any recent infections, allergies or URI. Patient s visual fields have not changed significantly in recent time.   PHYSICAL EXAM: BP 128/85   Pulse 88   Temp 98.4 F (36.9 C) (Tympanic)   Resp 16   Wt 154 lb (69.9 kg)   BMI 26.43 kg/m  Well-developed well-nourished patient in NAD. HEENT reveals PERLA, EOMI, discs not visualized.  Oral cavity is clear. No oral mucosal lesions are identified. Neck is clear without evidence of cervical or supraclavicular adenopathy. Lungs are clear to A&P. Cardiac examination is essentially unremarkable with regular rate and rhythm without murmur rub  or thrill. Abdomen is benign with no organomegaly or masses noted. Motor sensory and DTR levels are equal and symmetric in the upper and lower extremities. Cranial nerves II through XII are grossly intact. Proprioception is intact. No peripheral adenopathy or edema is identified. No motor or sensory levels are noted. Crude visual fields are within normal range.  LABORATORY DATA: Pathology reports reviewed    RADIOLOGY RESULTS: CT scans chest abdomen pelvis reviewed compatible with above-stated findings   IMPRESSION: High-grade serous carcinoma with clear cell features of the endometrium status post TAH/BSO sentinel lymph node biopsy and omentectomy in 38 year old female  PLAN: At this time patient will undergo 3 cycles of CarboTaxol under medical oncology's direction.  I would recommend vaginal brachytherapy after chemotherapy risks and benefits of treatment were explained to the patient in detail.  Possible side effects including increased lower urinary tract symptoms diarrhea fatigue and vaginal stenosis all were discussed with the patient.  I will reevaluate her after her chemotherapy and proceed for simulation at that time.  Patient comprehends her recommendations well.  I would like to take this opportunity to thank you for allowing me to participate in the care of your patient.SABRA Marcey Penton, MD

## 2024-03-11 ENCOUNTER — Inpatient Hospital Stay

## 2024-03-11 ENCOUNTER — Telehealth: Payer: Self-pay

## 2024-03-11 ENCOUNTER — Ambulatory Visit

## 2024-03-11 ENCOUNTER — Encounter: Payer: Self-pay | Admitting: Oncology

## 2024-03-11 ENCOUNTER — Inpatient Hospital Stay (HOSPITAL_BASED_OUTPATIENT_CLINIC_OR_DEPARTMENT_OTHER): Admitting: Nurse Practitioner

## 2024-03-11 VITALS — BP 144/89 | HR 80 | Temp 98.7°F | Resp 14 | Wt 154.3 lb

## 2024-03-11 DIAGNOSIS — Z5111 Encounter for antineoplastic chemotherapy: Secondary | ICD-10-CM | POA: Diagnosis not present

## 2024-03-11 DIAGNOSIS — C541 Malignant neoplasm of endometrium: Secondary | ICD-10-CM

## 2024-03-11 DIAGNOSIS — T50905A Adverse effect of unspecified drugs, medicaments and biological substances, initial encounter: Secondary | ICD-10-CM

## 2024-03-11 LAB — CMP (CANCER CENTER ONLY)
ALT: 40 U/L (ref 0–44)
AST: 22 U/L (ref 15–41)
Albumin: 4.3 g/dL (ref 3.5–5.0)
Alkaline Phosphatase: 102 U/L (ref 38–126)
Anion gap: 5 (ref 5–15)
BUN: 11 mg/dL (ref 6–20)
CO2: 27 mmol/L (ref 22–32)
Calcium: 9.2 mg/dL (ref 8.9–10.3)
Chloride: 108 mmol/L (ref 98–111)
Creatinine: 0.61 mg/dL (ref 0.44–1.00)
GFR, Estimated: >60 mL/min (ref >60)
Glucose, Bld: 100 mg/dL — ABNORMAL HIGH (ref 70–99)
Potassium: 4 mmol/L (ref 3.5–5.1)
Sodium: 140 mmol/L (ref 135–145)
Total Bilirubin: 0.5 mg/dL (ref 0.0–1.2)
Total Protein: 7.1 g/dL (ref 6.5–8.1)

## 2024-03-11 LAB — CBC WITH DIFFERENTIAL (CANCER CENTER ONLY)
Abs Immature Granulocytes: 0.01 K/uL (ref 0.00–0.07)
Basophils Absolute: 0 K/uL (ref 0.0–0.1)
Basophils Relative: 1 %
Eosinophils Absolute: 0.6 K/uL — ABNORMAL HIGH (ref 0.0–0.5)
Eosinophils Relative: 14 %
HCT: 36.3 % (ref 36.0–46.0)
Hemoglobin: 12.2 g/dL (ref 12.0–15.0)
Immature Granulocytes: 0 %
Lymphocytes Relative: 32 %
Lymphs Abs: 1.3 K/uL (ref 0.7–4.0)
MCH: 32 pg (ref 26.0–34.0)
MCHC: 33.6 g/dL (ref 30.0–36.0)
MCV: 95.3 fL (ref 80.0–100.0)
Monocytes Absolute: 0.2 K/uL (ref 0.1–1.0)
Monocytes Relative: 6 %
Neutro Abs: 1.9 K/uL (ref 1.7–7.7)
Neutrophils Relative %: 47 %
Platelet Count: 315 K/uL (ref 150–400)
RBC: 3.81 MIL/uL — ABNORMAL LOW (ref 3.87–5.11)
RDW: 12.1 % (ref 11.5–15.5)
WBC Count: 4.1 K/uL (ref 4.0–10.5)
nRBC: 0 % (ref 0.0–0.2)

## 2024-03-11 MED ORDER — APREPITANT 130 MG/18ML IV EMUL
130.0000 mg | Freq: Once | INTRAVENOUS | Status: AC
  Administered 2024-03-11: 130 mg via INTRAVENOUS
  Filled 2024-03-11: qty 18

## 2024-03-11 MED ORDER — DEXAMETHASONE SODIUM PHOSPHATE 10 MG/ML IJ SOLN
10.0000 mg | Freq: Once | INTRAMUSCULAR | Status: AC
  Administered 2024-03-11: 10 mg via INTRAVENOUS
  Filled 2024-03-11: qty 1

## 2024-03-11 MED ORDER — DIPHENHYDRAMINE HCL 50 MG/ML IJ SOLN
50.0000 mg | Freq: Once | INTRAMUSCULAR | Status: AC
  Administered 2024-03-11: 50 mg via INTRAVENOUS
  Filled 2024-03-11: qty 1

## 2024-03-11 MED ORDER — SODIUM CHLORIDE 0.9 % IV SOLN
770.4000 mg | Freq: Once | INTRAVENOUS | Status: AC
  Administered 2024-03-11: 770.4 mg via INTRAVENOUS
  Filled 2024-03-11: qty 77.04

## 2024-03-11 MED ORDER — SODIUM CHLORIDE 0.9 % IV SOLN
INTRAVENOUS | Status: DC
  Filled 2024-03-11: qty 250

## 2024-03-11 MED ORDER — SODIUM CHLORIDE 0.9 % IV SOLN
175.0000 mg/m2 | Freq: Once | INTRAVENOUS | Status: AC
  Administered 2024-03-11: 312 mg via INTRAVENOUS
  Filled 2024-03-11: qty 52

## 2024-03-11 MED ORDER — PALONOSETRON HCL INJECTION 0.25 MG/5ML
0.2500 mg | Freq: Once | INTRAVENOUS | Status: AC
  Administered 2024-03-11: 0.25 mg via INTRAVENOUS
  Filled 2024-03-11: qty 5

## 2024-03-11 MED ORDER — FAMOTIDINE IN NACL 20-0.9 MG/50ML-% IV SOLN
20.0000 mg | Freq: Once | INTRAVENOUS | Status: AC
  Administered 2024-03-11: 20 mg via INTRAVENOUS
  Filled 2024-03-11: qty 50

## 2024-03-11 NOTE — Telephone Encounter (Signed)
 Clinical Social Work was referred by medical provider to follow up regarding distress screening.  CSW attempted to contact patient by phone.  Left voicemail with contact information and request for return call.

## 2024-03-11 NOTE — Patient Instructions (Signed)

## 2024-03-11 NOTE — Progress Notes (Signed)
 Symptom Management Clinic  [facility] at [facility] A Department of the [place]. [facility] [address] [place], [place] [address] [phone] (phone) [phone] (fax)  Patient Care Team: [place], [place] [facility] as PCP - General Maurie Rayfield BIRCH, RN as Oncology Nurse Navigator Lenn Aran, MD as Consulting Physician (Radiation Oncology) Melanee Annah BROCKS, MD as Medical Oncologist (Oncology)   Name of the patient: Cathy Bray  [id number]  05-23-1986   Date of visit: 03/11/24  Diagnosis- Endometrial Cancer  Chief complaint/ Reason for visit- possible allergic reaction  Heme/Onc history:  Oncology History  Endometrial cancer (HCC)  01/20/2024 Initial Diagnosis   Endometrial cancer (HCC)   03/04/2024 Cancer Staging   Staging form: Corpus Uteri - Carcinoma and Carcinosarcoma, AJCC 8th Edition and FIGO 2023 - Pathologic stage from 03/04/2024: FIGO Stage IC, calculated as Stage IA (pT1a, pN0(sn), cM0, MMRd-, p53-) - Signed by Melanee Annah BROCKS, MD on 03/04/2024 Method of lymph node assessment: Sentinel lymph node biopsy   03/08/2024 Genetic Testing   Negative genetic testing. No pathogenic variants identified on the [employer] CancerNext+RNA Panel. The report date is 03/08/2024.  The [employer] CancerNext+RNAinsight Panel includes sequencing, rearrangement analysis, and RNA analysis for the following 40 genes: APC, ATM, BAP1, BARD1, BMPR1A, BRCA1, BRCA2, BRIP1, CDH1, CDKN2A, CHEK2, FH, FLCN, MET, MLH1, MSH2, MSH6, MUTYH, NF1, NTHL1, PALB2, PMS2, PTEN, RAD51C, RAD51D, RPS20, SMAD4, STK11, TP53, TSC1, TSC2, and VHL (sequencing and deletion/duplication); AXIN2, HOXB13, MBD4, MSH3, POLD1 and POLE (sequencing only); EPCAM and GREM1 (deletion/duplication only).   03/11/2024 -  Chemotherapy   Patient is on Treatment Plan : UTERINE Carboplatin  AUC 6 + Paclitaxel  q21d      Interval History- Cathy Bray is 38 year old female, diagnosed with uterine cancer,  currently receiving adjuvant carbo-taxol , who presents to symptom management for complaintso f possible infusion reaction following taxol  infusion.  Today, she has received her premeds and she had completed her Taxol  infusion when she began complaining that every 1 was sparkly and low back pain.  No shortness of breath, no hives, no itching, otherwise felt at baseline.  Review of systems- Review of Systems  HENT:  Negative for sore throat.   Respiratory:  Negative for cough, shortness of breath and wheezing.   Cardiovascular:  Negative for chest pain and palpitations.  Gastrointestinal:  Negative for abdominal pain, nausea and vomiting.  Musculoskeletal:  Positive for back pain.  Skin:  Negative for itching and rash.  Neurological:  Negative for focal weakness and headaches.     Allergies  Allergen Reactions   Prochlorperazine      It caused me to feel uneasy... jittery.  I felt like even when trying to sit still my body would still move.   Shrimp Extract    Shrimp [Shellfish Allergy] Hives   Past Medical History:  Diagnosis Date   Dysplasia of cervix    Hematuria    History of LEEP (loop electrosurgical excision procedure) of cervix complicating pregnancy    Infertility, female    Missed ab    Nausea and vomiting during pregnancy    Ovarian cyst    LEFT- 2.4CM    S/P urgent classical cesarean section 01/08/2015   Done for malpresentation (fetal arm in vagina) at [redacted]w[redacted]d s/p PPROM     Second trimester bleeding    Current Outpatient Medications:    acetaminophen  (TYLENOL ) 325 MG tablet, Take 650 mg by mouth., Disp: , Rfl:    dexamethasone  (DECADRON ) 4 MG tablet,  Take 2 tablets (8 mg total) by mouth daily for 3 days. Start the day after chemotherapy. Take with food., Disp: 30 tablet, Rfl: 1   Fezolinetant  (VEOZAH ) 45 MG TABS, Take 1 tablet (45 mg total) by mouth daily., Disp: 30 tablet, Rfl: 2   fluticasone  (FLONASE ) 50 MCG/ACT nasal spray, 2 spray in each nostril Nasally Once a day,  Disp: , Rfl:    ibuprofen  (ADVIL ) 600 MG tablet, Take 600 mg by mouth every 6 (six) hours as needed., Disp: , Rfl:    lidocaine -prilocaine  (EMLA ) cream, Apply to affected area once, Disp: 30 g, Rfl: 3   ondansetron  (ZOFRAN ) 4 MG tablet, Take 4 mg by mouth., Disp: , Rfl:    ondansetron  (ZOFRAN ) 8 MG tablet, Take 1 tablet (8 mg total) by mouth every 8 (eight) hours as needed for nausea or vomiting. Start on the third day after chemotherapy., Disp: 30 tablet, Rfl: 1   senna-docusate (SENOKOT-S) 8.6-50 MG tablet, Take 2 tablets by mouth., Disp: , Rfl:  No current facility-administered medications for this visit.  Facility-Administered Medications Ordered in Other Visits:    0.9 %  sodium chloride  infusion, , Intravenous, Continuous, Melanee Annah BROCKS, MD, Stopped at 03/11/24 1429   CARBOplatin  (PARAPLATIN ) 770.4 mg in sodium chloride  0.9 % 250 mL chemo infusion, 770.4 mg, Intravenous, Once, Melanee Annah BROCKS, MD, Last Rate: 654 mL/hr at 03/11/24 1501, 770.4 mg at 03/11/24 1501  Physical exam: There were no vitals filed for this visit. Physical Exam Vitals reviewed.  Constitutional:      Appearance: She is not ill-appearing.  Cardiovascular:     Rate and Rhythm: Normal rate and regular rhythm.  Pulmonary:     Effort: No respiratory distress.  Skin:    Findings: No rash.  Neurological:     Mental Status: She is alert and oriented to person, place, and time.       Assessment and plan- Patient is a 38 y.o. female, currently receiving chemotherapy who presents for possible infusion reaction  1.  Possible medication side effect-patient had completed her Taxol  infusion.  Overall tolerated well.  Reported low back pain after and vision changes (everyone is sparkling).  No evidence of infusion reaction or anaphylaxis.  Will plan to give IV fluids for 15 minutes then okay to proceed with carboplatin  today.  I suspect that her vision changes are more likely related to her premeds   Visit Diagnosis 1.  Medication side effect, initial encounter     Patient expressed understanding and was in agreement with this plan. She also understands that She can call clinic at any time with any questions, concerns, or complaints.   Thank you for allowing me to participate in the care of this very pleasant patient.   Tinnie Dawn, DNP, AGNP-C, AOCNP [facility] [phone]  CC: Dr Melanee

## 2024-03-11 NOTE — Progress Notes (Signed)
 At the completion of the taxol , during the saline flush, patient stated that she was seeing sparkles in her vision and had some low back pain. Tinnie Dawn, NP called to chairside. IV fluids given x 20 minutes. Symptoms resolved. Carboplatin  given without incident. Treatment completed.Patient discharged in ambulatory, stable condition.

## 2024-03-12 ENCOUNTER — Other Ambulatory Visit: Payer: Self-pay | Admitting: Nurse Practitioner

## 2024-03-12 MED ORDER — TRAMADOL HCL 50 MG PO TABS: 50.0000 mg | ORAL_TABLET | Freq: Three times a day (TID) | ORAL | 0 refills | Status: DC | PRN

## 2024-03-16 ENCOUNTER — Ambulatory Visit

## 2024-03-17 ENCOUNTER — Inpatient Hospital Stay: Admitting: Nurse Practitioner

## 2024-03-17 ENCOUNTER — Ambulatory Visit: Admitting: Nurse Practitioner

## 2024-03-17 DIAGNOSIS — E8941 Symptomatic postprocedural ovarian failure: Secondary | ICD-10-CM

## 2024-03-17 DIAGNOSIS — R110 Nausea: Secondary | ICD-10-CM

## 2024-03-17 MED ORDER — GABAPENTIN 300 MG PO CAPS
300.0000 mg | ORAL_CAPSULE | Freq: Every day | ORAL | 0 refills | Status: DC
Start: 2024-03-17 — End: 2024-04-14

## 2024-03-17 MED ORDER — PROMETHAZINE HCL 25 MG PO TABS: 12.5000 mg | ORAL_TABLET | Freq: Three times a day (TID) | ORAL | 0 refills | Status: AC | PRN

## 2024-03-17 NOTE — Progress Notes (Signed)
 Virtual Visit Progress Note  Symptom Management Clinic  [facility] at [facility] A Department of the [place]. [facility] [address] [place], [place] [address] [phone] (phone) [phone] (fax)  I connected with Cathy Bray on 03/17/24 at  3:30 PM EDT by video enabled telemedicine visit and verified that I am speaking with the correct person using two identifiers.   I discussed the limitations, risks, security and privacy concerns of performing an evaluation and management service by telemedicine and the availability of in-person appointments. I also discussed with the patient that there may be a patient responsible charge related to this service. The patient expressed understanding and agreed to proceed.   Other persons participating in the visit and their role in the encounter: none   Patient's location: home  Provider's location: clinic     Patient Care Team: [place], [facility] as PCP - General Maurie Rayfield BIRCH, RN as Oncology Nurse Navigator Lenn Aran, MD as Consulting Physician (Radiation Oncology) Melanee Annah BROCKS, MD as Medical Oncologist (Oncology)   Name of the patient: Cathy Bray  [id number]  1986-01-25   Date of visit: 03/17/24  Diagnosis- endometrial cancer  Chief complaint/ Reason for visit- nausea & hot flashes  Heme/Onc history:  Oncology History  Endometrial cancer (HCC)  01/20/2024 Initial Diagnosis   Endometrial cancer (HCC)   03/04/2024 Cancer Staging   Staging form: Corpus Uteri - Carcinoma and Carcinosarcoma, AJCC 8th Edition and FIGO 2023 - Pathologic stage from 03/04/2024: FIGO Stage IC, calculated as Stage IA (pT1a, pN0(sn), cM0, MMRd-, p53-) - Signed by Melanee Annah BROCKS, MD on 03/04/2024 Method of lymph node assessment: Sentinel lymph node biopsy   03/08/2024 Genetic Testing   Negative genetic testing. No pathogenic variants identified on the [employer] CancerNext+RNA Panel. The report  date is 03/08/2024.  The [employer] CancerNext+RNAinsight Panel includes sequencing, rearrangement analysis, and RNA analysis for the following 40 genes: APC, ATM, BAP1, BARD1, BMPR1A, BRCA1, BRCA2, BRIP1, CDH1, CDKN2A, CHEK2, FH, FLCN, MET, MLH1, MSH2, MSH6, MUTYH, NF1, NTHL1, PALB2, PMS2, PTEN, RAD51C, RAD51D, RPS20, SMAD4, STK11, TP53, TSC1, TSC2, and VHL (sequencing and deletion/duplication); AXIN2, HOXB13, MBD4, MSH3, POLD1 and POLE (sequencing only); EPCAM and GREM1 (deletion/duplication only).   03/11/2024 -  Chemotherapy   Patient is on Treatment Plan : UTERINE Carboplatin  AUC 6 + Paclitaxel  q21d       Interval history- Cathy Bray is a 38 year old female diagnosed with endometrial cancer currently receiving adjuvant carbo-Taxol  chemotherapy, who agrees to telemedicine visit today for complaints of nausea unrelieved by current antiemetics, and hot flashes.  She says that she has had nausea over the last 24 hours that interferes with her ability to sleep.  She has taken Compazine  and Zofran  without improvement.  Says that she thinks she would feel better if she could sleep.  Also has hot flashes waking her up.  She is on Veozah  which has improved her symptoms.  Primarily at night.  Appetite is down.  No vomiting.  Review of systems- Review of Systems  Constitutional:  Positive for malaise/fatigue. Negative for chills, fever and weight loss.  HENT:  Negative for hearing loss, nosebleeds, sore throat and tinnitus.   Eyes:  Negative for blurred vision and double vision.  Respiratory:  Negative for cough, hemoptysis, shortness of breath and wheezing.   Cardiovascular:  Negative for chest pain, palpitations and leg swelling.  Gastrointestinal:  Positive for nausea. Negative for abdominal pain, blood in stool, constipation, diarrhea,  melena and vomiting.  Genitourinary:  Negative for dysuria and urgency.  Musculoskeletal:  Negative for back pain, falls, joint pain and myalgias.  Skin:  Negative for  itching and rash.  Neurological:  Positive for sensory change. Negative for dizziness, tingling, loss of consciousness, weakness and headaches.  Endo/Heme/Allergies:  Negative for environmental allergies. Does not bruise/bleed easily.  Psychiatric/Behavioral:  Negative for depression. The patient is nervous/anxious and has insomnia.     Allergies  Allergen Reactions   Prochlorperazine      It caused me to feel uneasy... jittery.  I felt like even when trying to sit still my body would still move.   Shrimp Extract    Shrimp [Shellfish Allergy] Hives   Past Medical History:  Diagnosis Date   Dysplasia of cervix    Hematuria    History of LEEP (loop electrosurgical excision procedure) of cervix complicating pregnancy    Infertility, female    Missed ab    Nausea and vomiting during pregnancy    Ovarian cyst    LEFT- 2.4CM    S/P urgent classical cesarean section 01/08/2015   Done for malpresentation (fetal arm in vagina) at [redacted]w[redacted]d s/p PPROM     Second trimester bleeding    Past Surgical History:  Procedure Laterality Date   CESAREAN SECTION N/A 01/08/2015   Procedure: CESAREAN SECTION;  Surgeon: Gloris DELENA Hugger, MD;  Location: [facility] ORS;  Service: Obstetrics;  Laterality: N/A;  classical on uterus    LAPAROTOMY N/A 01/23/2015   Procedure: EXPLORATORY LAPAROTOMY;  Surgeon: Gladis DELENA Dollar, MD;  Location: [facility] ORS;  Service: Gynecology;  Laterality: N/A;   LEEP  2012   [facility]   Social History   Socioeconomic History   Marital status: Married    Spouse name: Not on file   Number of children: 3   Years of education: Not on file   Highest education level: Not on file  Occupational History   Not on file  Tobacco Use   Smoking status: Never   Smokeless tobacco: Never  Vaping Use   Vaping status: Never Used  Substance and Sexual Activity   Alcohol use: No   Drug use: No   Sexual activity: Not Currently    Birth control/protection: None  Other Topics Concern   Not on file   Social History Narrative   Not on file   Social Drivers of Health   Financial Resource Strain: Low Risk  (02/08/2024)   Received from [facility]   Overall Financial Resource Strain (CARDIA)    Difficulty of Paying Living Expenses: Not hard at all  Food Insecurity: No Food Insecurity (03/04/2024)   Hunger Vital Sign    Worried About Running Out of Food in the Last Year: Never true    Ran Out of Food in the Last Year: Never true  Transportation Needs: No Transportation Needs (03/04/2024)   PRAPARE - Administrator, Civil Service (Medical): No    Lack of Transportation (Non-Medical): No  Physical Activity: Sufficiently Active (11/12/2017)   Exercise Vital Sign    Days of Exercise per Week: 5 days    Minutes of Exercise per Session: 30 min  Stress: Not on file  Social Connections: Not on file  Intimate Partner Violence: Not At Risk (03/04/2024)   Humiliation, Afraid, Rape, and Kick questionnaire    Fear of Current or Ex-Partner: No    Emotionally Abused: No    Physically Abused: No    Sexually Abused: No  Family History  Problem Relation Age of Onset   Hypertension Mother    Diabetes Father    Heart disease Neg Hx    Breast cancer Neg Hx    Colon cancer Neg Hx    Ovarian cancer Neg Hx     Current Outpatient Medications:    acetaminophen  (TYLENOL ) 325 MG tablet, Take 650 mg by mouth., Disp: , Rfl:    dexamethasone  (DECADRON ) 4 MG tablet, Take 2 tablets (8 mg total) by mouth daily for 3 days. Start the day after chemotherapy. Take with food., Disp: 30 tablet, Rfl: 1   Fezolinetant  (VEOZAH ) 45 MG TABS, Take 1 tablet (45 mg total) by mouth daily., Disp: 30 tablet, Rfl: 2   fluticasone  (FLONASE ) 50 MCG/ACT nasal spray, 2 spray in each nostril Nasally Once a day, Disp: , Rfl:    ibuprofen  (ADVIL ) 600 MG tablet, Take 600 mg by mouth every 6 (six) hours as needed., Disp: , Rfl:    lidocaine -prilocaine  (EMLA ) cream, Apply to affected area once, Disp: 30 g,  Rfl: 3   ondansetron  (ZOFRAN ) 4 MG tablet, Take 4 mg by mouth., Disp: , Rfl:    ondansetron  (ZOFRAN ) 8 MG tablet, Take 1 tablet (8 mg total) by mouth every 8 (eight) hours as needed for nausea or vomiting. Start on the third day after chemotherapy., Disp: 30 tablet, Rfl: 1   senna-docusate (SENOKOT-S) 8.6-50 MG tablet, Take 2 tablets by mouth., Disp: , Rfl:    traMADol  (ULTRAM ) 50 MG tablet, Take 1 tablet (50 mg total) by mouth every 8 (eight) hours as needed. For pain unrelieved by tylenol , Disp: 30 tablet, Rfl: 0  Physical exam: Exam limited due to telemedicine  There were no vitals filed for this visit. Physical Exam Vitals reviewed.  Constitutional:      General: She is not in acute distress.    Comments: Fatigued appearing. In bed.   Pulmonary:     Effort: No respiratory distress.  Neurological:     Mental Status: She is alert and oriented to person, place, and time.  Psychiatric:        Behavior: Behavior normal.    Assessment and plan- Patient is a 38 y.o. female receiving adjuvant carbo-taxol  chemotherapy who presents for follow up of:    Hot Flashes- on veozah . Improved night time symptoms with night time dosing however, persistent daytime symptoms. Recommend adding gabapentin  300 mg at bedtime for nighttime symptoms and switching veozah  which is non-sedating to daytime dosing.  Medication side effect- secondary to pre-meds vs chemo. resolved same day. No recurrent symptoms.  Pain- Joint aches and pains post chemo. Primarily legs but some in arms. On tylenol  750 mg q6h. Add claritin  daily.  Nausea- likely secondary to chemotherapy. Unrelieved by zofran  and compazine . Plan to rotate from compazine  to phenergan .  Depression- previously contacted by Macario. She will reach back out for counseling.   Dispo 10/9 or 10/10- virtual follow up with me for hot flash & chemo f/u- la  Visit Diagnosis 1. Chemotherapy-induced nausea   2. Surgical menopause, symptomatic    Patient  expressed understanding and was in agreement with this plan. She also understands that She can call clinic at any time with any questions, concerns, or complaints.   I discussed the assessment and treatment plan with the patient. The patient was provided an opportunity to ask questions and all were answered. The patient agreed with the plan and demonstrated an understanding of the instructions.   The patient was advised to call  back or seek an in-person evaluation if the symptoms worsen or if the condition fails to improve as anticipated.   I spent 15 minutes face-to-face video visit time dedicated to the care of this patient on the date of this encounter to include pre-visit review of previous and interval notes, face-to-face time with the patient, and post visit ordering of testing/documentation.   Thank you for allowing me to participate in the care of this very pleasant patient.   Tinnie Dawn, DNP, AGNP-C [facility]  CC: Macario Au, LCSW

## 2024-03-18 ENCOUNTER — Telehealth: Payer: Self-pay | Admitting: Nurse Practitioner

## 2024-03-18 ENCOUNTER — Encounter: Payer: Self-pay | Admitting: Oncology

## 2024-03-18 ENCOUNTER — Inpatient Hospital Stay

## 2024-03-18 ENCOUNTER — Telehealth: Payer: Self-pay

## 2024-03-18 NOTE — Telephone Encounter (Signed)
 Received forwarded email from Kristi S, nurse navigator, that patient was trying to contact me.  Left message for patient to return call for additional information.

## 2024-03-18 NOTE — Progress Notes (Signed)
 [facility] Clinical Social Work  Clinical Social Work was referred by medical provider, Tinnie Dawn, NP,  for emotional support.  Clinical Social Worker contacted patient by phone to offer support and assess for needs.     Interventions: Provided brief mental health counseling with regard to depression.  Patient reports feeling much better today and attributes the depression to pain and nausea issues.  She slept well last night with the new medication.  Patient is currently on STD from her work.  Her husband and children are very supportive.  CSW mailed her the [employer] and contact information.       Follow Up Plan:  CSW will follow-up with patient by phone     Macario CHRISTELLA Au, LCSW  Clinical Social Worker [facility]

## 2024-03-18 NOTE — Telephone Encounter (Signed)
 Called patient to verify her location on 10/9 or 10/10 to schedule my chart visit for Lauren per wrap up on 9/18. Appointment is asking to verify patient location before appointment can be scheduled. Left voicemail for patient to call back to assist with scheduling this appointment.

## 2024-03-22 ENCOUNTER — Telehealth: Payer: Self-pay

## 2024-03-22 ENCOUNTER — Encounter: Payer: Self-pay | Admitting: Oncology

## 2024-03-22 NOTE — Telephone Encounter (Signed)
 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    She is now requesting short term disability for chemo.  OK to complete request?  If so, how long would you like to provide?

## 2024-03-22 NOTE — Telephone Encounter (Signed)
 Patient called requesting I return her call.  Message left asking her to call office with additional info or can send MyChart message.

## 2024-03-23 ENCOUNTER — Encounter: Payer: Self-pay | Admitting: Oncology

## 2024-03-25 ENCOUNTER — Encounter: Payer: Self-pay | Admitting: Oncology

## 2024-03-28 ENCOUNTER — Telehealth: Payer: Self-pay

## 2024-03-28 ENCOUNTER — Encounter: Payer: Self-pay | Admitting: Nurse Practitioner

## 2024-03-28 NOTE — Telephone Encounter (Signed)
 Patient sent MyChart message attached to a historical message on 03/22/24:    New message sent on 03/28/24:  My HR representative sent me an email today regarding my FMLA. Apparently there is another document that needs to be filled out since i have used up all my FMLA. Please see form attached and if you can please fax to [phone]. Thanks again.  Attachments  ADA Accommodation Request Assessment Form.pdf   [email or web address]

## 2024-03-28 NOTE — Telephone Encounter (Signed)
 Dr. Melanee, You approved patient's FMLA from 03/30/24 to 06/30/24.  Ms. Hanzlik will have exhausted the 12 week allowed FMLA time on 04/26/24.    Patient sends in an Accommodations Assessment Form (ADA) to be completed.  She does have an appt with you on 04/01/24 for lab/MD/tx.  Would you like me to work on the ADA now or wait until you can discuss with her on Friday?

## 2024-03-28 NOTE — Telephone Encounter (Signed)
 You can work on it now.

## 2024-03-30 ENCOUNTER — Encounter: Payer: Self-pay | Admitting: Oncology

## 2024-04-01 ENCOUNTER — Inpatient Hospital Stay (HOSPITAL_BASED_OUTPATIENT_CLINIC_OR_DEPARTMENT_OTHER): Admitting: Oncology

## 2024-04-01 ENCOUNTER — Encounter: Payer: Self-pay | Admitting: Oncology

## 2024-04-01 ENCOUNTER — Telehealth: Payer: Self-pay

## 2024-04-01 ENCOUNTER — Telehealth: Payer: Self-pay | Admitting: Oncology

## 2024-04-01 ENCOUNTER — Inpatient Hospital Stay

## 2024-04-01 ENCOUNTER — Inpatient Hospital Stay: Attending: Obstetrics and Gynecology

## 2024-04-01 VITALS — BP 120/89 | HR 104 | Temp 98.4°F | Resp 18 | Ht 64.0 in | Wt 156.9 lb

## 2024-04-01 VITALS — BP 136/87 | HR 100 | Resp 18

## 2024-04-01 DIAGNOSIS — T451X5A Adverse effect of antineoplastic and immunosuppressive drugs, initial encounter: Secondary | ICD-10-CM | POA: Insufficient documentation

## 2024-04-01 DIAGNOSIS — R7989 Other specified abnormal findings of blood chemistry: Secondary | ICD-10-CM | POA: Diagnosis not present

## 2024-04-01 DIAGNOSIS — Z7952 Long term (current) use of systemic steroids: Secondary | ICD-10-CM | POA: Diagnosis not present

## 2024-04-01 DIAGNOSIS — Z79899 Other long term (current) drug therapy: Secondary | ICD-10-CM | POA: Insufficient documentation

## 2024-04-01 DIAGNOSIS — C541 Malignant neoplasm of endometrium: Secondary | ICD-10-CM | POA: Diagnosis present

## 2024-04-01 DIAGNOSIS — G620 Drug-induced polyneuropathy: Secondary | ICD-10-CM | POA: Insufficient documentation

## 2024-04-01 DIAGNOSIS — R5383 Other fatigue: Secondary | ICD-10-CM | POA: Diagnosis not present

## 2024-04-01 DIAGNOSIS — Z5111 Encounter for antineoplastic chemotherapy: Secondary | ICD-10-CM

## 2024-04-01 DIAGNOSIS — Z90710 Acquired absence of both cervix and uterus: Secondary | ICD-10-CM | POA: Insufficient documentation

## 2024-04-01 DIAGNOSIS — Z90722 Acquired absence of ovaries, bilateral: Secondary | ICD-10-CM | POA: Diagnosis not present

## 2024-04-01 LAB — CBC WITH DIFFERENTIAL (CANCER CENTER ONLY)
Abs Immature Granulocytes: 0.04 K/uL (ref 0.00–0.07)
Basophils Absolute: 0 K/uL (ref 0.0–0.1)
Basophils Relative: 0 %
Eosinophils Absolute: 0.1 K/uL (ref 0.0–0.5)
Eosinophils Relative: 2 %
HCT: 35.5 % — ABNORMAL LOW (ref 36.0–46.0)
Hemoglobin: 12.2 g/dL (ref 12.0–15.0)
Immature Granulocytes: 1 %
Lymphocytes Relative: 37 %
Lymphs Abs: 1.3 K/uL (ref 0.7–4.0)
MCH: 31.7 pg (ref 26.0–34.0)
MCHC: 34.4 g/dL (ref 30.0–36.0)
MCV: 92.2 fL (ref 80.0–100.0)
Monocytes Absolute: 0.3 K/uL (ref 0.1–1.0)
Monocytes Relative: 9 %
Neutro Abs: 1.8 K/uL (ref 1.7–7.7)
Neutrophils Relative %: 51 %
Platelet Count: 268 K/uL (ref 150–400)
RBC: 3.85 MIL/uL — ABNORMAL LOW (ref 3.87–5.11)
RDW: 12.7 % (ref 11.5–15.5)
WBC Count: 3.5 K/uL — ABNORMAL LOW (ref 4.0–10.5)
nRBC: 0 % (ref 0.0–0.2)

## 2024-04-01 LAB — CMP (CANCER CENTER ONLY)
ALT: 125 U/L — ABNORMAL HIGH (ref 0–44)
AST: 40 U/L (ref 15–41)
Albumin: 4 g/dL (ref 3.5–5.0)
Alkaline Phosphatase: 137 U/L — ABNORMAL HIGH (ref 38–126)
Anion gap: 7 (ref 5–15)
BUN: 12 mg/dL (ref 6–20)
CO2: 27 mmol/L (ref 22–32)
Calcium: 8.9 mg/dL (ref 8.9–10.3)
Chloride: 106 mmol/L (ref 98–111)
Creatinine: 0.5 mg/dL (ref 0.44–1.00)
GFR, Estimated: >60 mL/min (ref >60)
Glucose, Bld: 103 mg/dL — ABNORMAL HIGH (ref 70–99)
Potassium: 3.7 mmol/L (ref 3.5–5.1)
Sodium: 140 mmol/L (ref 135–145)
Total Bilirubin: 0.6 mg/dL (ref 0.0–1.2)
Total Protein: 6.6 g/dL (ref 6.5–8.1)

## 2024-04-01 MED ORDER — SODIUM CHLORIDE 0.9 % IV SOLN
INTRAVENOUS | Status: DC
  Filled 2024-04-01: qty 250

## 2024-04-01 MED ORDER — SODIUM CHLORIDE 0.9 % IV SOLN
770.4000 mg | Freq: Once | INTRAVENOUS | Status: AC
  Administered 2024-04-01: 770.4 mg via INTRAVENOUS
  Filled 2024-04-01: qty 77.04

## 2024-04-01 MED ORDER — DIPHENHYDRAMINE HCL 50 MG/ML IJ SOLN
50.0000 mg | Freq: Once | INTRAMUSCULAR | Status: AC
  Administered 2024-04-01: 50 mg via INTRAVENOUS
  Filled 2024-04-01: qty 1

## 2024-04-01 MED ORDER — DEXAMETHASONE SODIUM PHOSPHATE 10 MG/ML IJ SOLN
10.0000 mg | Freq: Once | INTRAMUSCULAR | Status: AC
  Administered 2024-04-01: 10 mg via INTRAVENOUS
  Filled 2024-04-01: qty 1

## 2024-04-01 MED ORDER — PALONOSETRON HCL INJECTION 0.25 MG/5ML
0.2500 mg | Freq: Once | INTRAVENOUS | Status: AC
  Administered 2024-04-01: 0.25 mg via INTRAVENOUS
  Filled 2024-04-01: qty 5

## 2024-04-01 MED ORDER — SODIUM CHLORIDE 0.9 % IV SOLN
175.0000 mg/m2 | Freq: Once | INTRAVENOUS | Status: AC
  Administered 2024-04-01: 312 mg via INTRAVENOUS
  Filled 2024-04-01: qty 52

## 2024-04-01 MED ORDER — FAMOTIDINE IN NACL 20-0.9 MG/50ML-% IV SOLN
20.0000 mg | Freq: Once | INTRAVENOUS | Status: AC
  Administered 2024-04-01: 20 mg via INTRAVENOUS
  Filled 2024-04-01: qty 50

## 2024-04-01 MED ORDER — APREPITANT 130 MG/18ML IV EMUL
130.0000 mg | Freq: Once | INTRAVENOUS | Status: AC
  Administered 2024-04-01: 130 mg via INTRAVENOUS
  Filled 2024-04-01: qty 18

## 2024-04-01 NOTE — Telephone Encounter (Signed)
 Per secure chat Hey ladies - pt just called and said she just finished infusion and had a question pertaining to tylenol  post-infusion. Transferred her to triage - just sending this incase the call goes to vm.SABRA Finch call; informed Dr. Melanee indicated it is fine to take Tylenol . Patient asked if Tylenol  does not assist relieving pain how long to wait before taking Tramadol . Informed to be on the safe side wait a few hours and continue with taking Tramadol  as directed / needed.  Patient verbalized understanding.

## 2024-04-01 NOTE — Progress Notes (Signed)
 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 Leonce and subsequently seen by GYN oncology for concerns of endometrial cancer.  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  IHC showed tumor cells positive for PAX8 and ER while negative for Napsin A p53 p16 Calretinin TTF-1 and WT1.  Also noted to have high-grade SIL on Pap.     She underwent TAH/BSO sentinel lymph node biopsy omentectomy on 02/02/2024.  She was noted to have multiple loops of ileum and ileal mesentery adherent to the anterior abdominal wall uterine fundus and pelvic sidewall.  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  4.5 cm.  Myometrial invasion absent.  Lymphatic invasion absent.  Cervix and serosa negative for malignancy.  Left and right ovary and fallopian tube negative for malignancy.  2 pelvic lymph nodes on the left and 5 on the right negative for malignancy.  Omentum resection showed benign fibroadipose tissue.   Patient seen by GYN oncology.  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  She still has considerable fatigue.  Also had nausea but that was well-controlled with Zofran   ECOG PS- 0 Pain scale- 3   Review of systems- Review of Systems  Constitutional:  Positive for malaise/fatigue. Negative for chills, fever and weight loss.  HENT:  Negative for congestion, ear discharge and nosebleeds.   Eyes:  Negative for blurred vision.  Respiratory:  Negative for cough, hemoptysis, sputum production, shortness of breath and wheezing.   Cardiovascular:  Negative for chest pain, palpitations, orthopnea and claudication.  Gastrointestinal:  Positive for nausea. Negative for abdominal pain, blood in stool, constipation, diarrhea, heartburn, melena and vomiting.  Genitourinary:  Negative for dysuria, flank pain, frequency, hematuria and urgency.  Musculoskeletal:  Positive for myalgias. Negative for back pain and joint pain.  Skin:  Negative for rash.  Neurological:  Negative for dizziness, tingling, focal weakness, seizures, weakness and headaches.  Endo/Heme/Allergies:  Does not bruise/bleed easily.  Psychiatric/Behavioral:  Negative for depression and suicidal ideas. The patient does not have insomnia.       Allergies  Allergen Reactions   Prochlorperazine      It caused me to feel uneasy... jittery.  I felt like even when trying to sit still my body would still move.   Shrimp Extract    Shrimp [Shellfish Allergy] Hives     Past Medical History:  Diagnosis Date   Dysplasia of cervix    Hematuria    History of LEEP (loop electrosurgical excision procedure) of cervix complicating pregnancy    Infertility, female    Missed ab    Nausea and vomiting during pregnancy  Ovarian cyst    LEFT- 2.4CM    S/P urgent classical cesarean section 01/08/2015   Done for malpresentation (fetal arm in vagina) at [redacted]w[redacted]d s/p PPROM     Second trimester bleeding      Past Surgical History:  Procedure Laterality Date   CESAREAN  SECTION N/A 01/08/2015   Procedure: CESAREAN SECTION;  Surgeon: Gloris DELENA Hugger, MD;  Location: [facility];  Service: Obstetrics;  Laterality: N/A;  classical on uterus    LAPAROTOMY N/A 01/23/2015   Procedure: EXPLORATORY LAPAROTOMY;  Surgeon: Gladis DELENA Dollar, MD;  Location: [facility] ORS;  Service: Gynecology;  Laterality: N/A;   LEEP  2012   [facility]    Social History   Socioeconomic History   Marital status: Married    Spouse name: Not on file   Number of children: 3   Years of education: Not on file   Highest education level: Not on file  Occupational History   Not on file  Tobacco Use   Smoking status: Never   Smokeless tobacco: Never  Vaping Use   Vaping status: Never Used  Substance and Sexual Activity   Alcohol use: No   Drug use: No   Sexual activity: Not Currently    Birth control/protection: None  Other Topics Concern   Not on file  Social History Narrative   Not on file   Social Drivers of Health   Financial Resource Strain: Low Risk  (02/08/2024)   Received from [facility]   Overall Financial Resource Strain (CARDIA)    Difficulty of Paying Living Expenses: Not hard at all  Food Insecurity: No Food Insecurity (03/04/2024)   Hunger Vital Sign    Worried About Running Out of Food in the Last Year: Never true    Ran Out of Food in the Last Year: Never true  Transportation Needs: No Transportation Needs (03/04/2024)   PRAPARE - Administrator, Civil Service (Medical): No    Lack of Transportation (Non-Medical): No  Physical Activity: Sufficiently Active (11/12/2017)   Exercise Vital Sign    Days of Exercise per Week: 5 days    Minutes of Exercise per Session: 30 min  Stress: Not on file  Social Connections: Not on file  Intimate Partner Violence: Not At Risk (03/04/2024)   Humiliation, Afraid, Rape, and Kick questionnaire    Fear of Current or Ex-Partner: No    Emotionally Abused: No    Physically Abused: No    Sexually Abused: No     Family History  Problem Relation Age of Onset   Hypertension Mother    Diabetes Father    Heart disease Neg Hx    Breast cancer Neg Hx    Colon cancer Neg Hx    Ovarian cancer Neg Hx      Current Outpatient Medications:    acetaminophen  (TYLENOL ) 325 MG tablet, Take 650 mg by mouth., Disp: , Rfl:    dexamethasone  (DECADRON ) 4 MG tablet, Take 2 tablets (8 mg total) by mouth daily for 3 days. Start the day after chemotherapy. Take with food., Disp: 30 tablet, Rfl: 1   Fezolinetant  (VEOZAH ) 45 MG TABS, Take 1 tablet (45 mg total) by mouth daily., Disp: 30 tablet, Rfl: 2   fluticasone  (FLONASE ) 50 MCG/ACT nasal spray, 2 spray in each nostril Nasally Once a day, Disp: , Rfl:    gabapentin  (NEURONTIN ) 300 MG capsule, Take 1 capsule (300 mg total) by mouth at bedtime., Disp: 30 capsule, Rfl: 0  ibuprofen  (ADVIL ) 600 MG tablet, Take 600 mg by mouth every 6 (six) hours as needed., Disp: , Rfl:    lidocaine -prilocaine  (EMLA ) cream, Apply to affected area once, Disp: 30 g, Rfl: 3   olopatadine (PATANOL) 0.1 % ophthalmic solution, Place 1 drop into both eyes., Disp: , Rfl:    ondansetron  (ZOFRAN ) 4 MG tablet, Take 4 mg by mouth., Disp: , Rfl:    ondansetron  (ZOFRAN ) 8 MG tablet, Take 1 tablet (8 mg total) by mouth every 8 (eight) hours as needed for nausea or vomiting. Start on the third day after chemotherapy., Disp: 30 tablet, Rfl: 1   promethazine  (PHENERGAN ) 25 MG tablet, Take 0.5 tablets (12.5 mg total) by mouth every 8 (eight) hours as needed for nausea or vomiting., Disp: 30 tablet, Rfl: 0   senna-docusate (SENOKOT-S) 8.6-50 MG tablet, Take 2 tablets by mouth., Disp: , Rfl:    traMADol  (ULTRAM ) 50 MG tablet, Take 1 tablet (50 mg total) by mouth every 8 (eight) hours as needed. For pain unrelieved by tylenol , Disp: 30 tablet, Rfl: 0 No current facility-administered medications for this visit.  Facility-Administered Medications Ordered in Other Visits:    0.9 %  sodium chloride  infusion,  , Intravenous, Continuous, Melanee Annah BROCKS, MD, Last Rate: 10 mL/hr at 04/01/24 0911, New Bag at 04/01/24 0911   CARBOplatin  (PARAPLATIN ) 770.4 mg in sodium chloride  0.9 % 250 mL chemo infusion, 770.4 mg, Intravenous, Once, Melanee Annah BROCKS, MD   famotidine  (PEPCID ) IVPB 20 mg premix, 20 mg, Intravenous, Once, Melanee Annah BROCKS, MD, Last Rate: 200 mL/hr at 04/01/24 0929, 20 mg at 04/01/24 9070   PACLitaxel  (TAXOL ) 312 mg in sodium chloride  0.9 % 500 mL chemo infusion (> 80mg /m2), 175 mg/m2 (Treatment Plan Recorded), Intravenous, Once, Melanee Annah BROCKS, MD  Physical exam:  Vitals:   04/01/24 0830  BP: 120/89  Pulse: (!) 104  Resp: 18  Temp: 98.4 F (36.9 C)  TempSrc: Tympanic  SpO2: 100%  Weight: 156 lb 14.4 oz (71.2 kg)  Height: 5' 4 (1.626 m)   Physical Exam Cardiovascular:     Rate and Rhythm: Regular rhythm. Tachycardia present.     Heart sounds: Normal heart sounds.  Pulmonary:     Effort: Pulmonary effort is normal.     Breath sounds: Normal breath sounds.  Skin:    General: Skin is warm and dry.  Neurological:     Mental Status: She is alert and oriented to person, place, and time.      I have personally reviewed labs listed below:    Latest Ref Rng & Units 04/01/2024    8:15 AM  CMP  Glucose 70 - 99 mg/dL 896   BUN 6 - 20 mg/dL 12   Creatinine 9.55 - 1.00 mg/dL 9.49   Sodium 864 - 854 mmol/L 140   Potassium 3.5 - 5.1 mmol/L 3.7   Chloride 98 - 111 mmol/L 106   CO2 22 - 32 mmol/L 27   Calcium  8.9 - 10.3 mg/dL 8.9   Total Protein 6.5 - 8.1 g/dL 6.6   Total Bilirubin 0.0 - 1.2 mg/dL 0.6   Alkaline Phos 38 - 126 U/L 137   AST 15 - 41 U/L 40   ALT 0 - 44 U/L 125       Latest Ref Rng & Units 04/01/2024    8:15 AM  CBC  WBC 4.0 - 10.5 K/uL 3.5   Hemoglobin 12.0 - 15.0 g/dL 87.7   Hematocrit 63.9 - 46.0 % 35.5  Platelets 150 - 400 K/uL 268       Assessment and plan- Patient is a 38 y.o. female with history of stage I aT1a N0 M0 p53 negative serous cell  endometrial carcinoma here for on treatment assessment prior to cycle 2 of adjuvant CarboTaxol chemotherapy  Counts okay to proceed with cycle 2 of CarboTaxol chemotherapy today.  White count is 3.5 with an ANC of 1.8.  Her ANC was 1.9 for cycle 1 of treatment.  I am holding off on giving her Neulasta today.  Patient already had significant pain in her bilateral lower extremities after cycle 1 of chemotherapy likely secondary to paclitaxel  induced acute pain syndrome and I am avoiding further worsening with Neulasta at this time.  I will see her back in 3 weeks for cycle 3 of chemotherapy which will be her last cycle.  She will then proceed for vaginal brachytherapy.  Abnormal LFTs: Overall mild continue to monitor  Paclitaxel  induced acute pain syndrome: She does have as needed tramadol  which she will consider using if pain not controlled with Tylenol  and gabapentin    Visit Diagnosis 1. Endometrial cancer (HCC)   2. Encounter for antineoplastic chemotherapy      Dr. Annah Skene, MD, MPH [facility] [phone] 04/01/2024 9:34 AM

## 2024-04-01 NOTE — Patient Instructions (Signed)

## 2024-04-01 NOTE — Telephone Encounter (Signed)
 Pt called and said she just finished infusion and had a question pertaining to tylenol  post-infusion - transferred call to triage and sent secure chat - [facility]

## 2024-04-01 NOTE — Progress Notes (Signed)
 Patient doing well, she does have questions regarding pain medicine.

## 2024-04-07 ENCOUNTER — Encounter: Payer: Self-pay | Admitting: Nurse Practitioner

## 2024-04-07 ENCOUNTER — Inpatient Hospital Stay (HOSPITAL_BASED_OUTPATIENT_CLINIC_OR_DEPARTMENT_OTHER): Admitting: Nurse Practitioner

## 2024-04-07 DIAGNOSIS — E8941 Symptomatic postprocedural ovarian failure: Secondary | ICD-10-CM

## 2024-04-07 DIAGNOSIS — C541 Malignant neoplasm of endometrium: Secondary | ICD-10-CM | POA: Diagnosis not present

## 2024-04-07 DIAGNOSIS — N951 Menopausal and female climacteric states: Secondary | ICD-10-CM

## 2024-04-07 NOTE — Progress Notes (Unsigned)
 Virtual Visit Progress Note  Symptom Management Clinic  [facility] at [facility] Department of the [place]. [employer] [address] [place], [place] [address] [phone] (phone) [phone] (fax)  I connected with Cathy Bray on 04/07/24 at  3:00 PM EDT by video enabled telemedicine visit and verified that I am speaking with the correct person using two identifiers.   I discussed the limitations, risks, security and privacy concerns of performing an evaluation and management service by telemedicine and the availability of in-person appointments. I also discussed with the patient that there may be a patient responsible charge related to this service. The patient expressed understanding and agreed to proceed.   Other persons participating in the visit and their role in the encounter: none   Patient's location: home  Provider's location: clinic   Chief Complaint: hot flashes    Patient Care Team: [place] as PCP - General Maurie Rayfield BIRCH, RN as Oncology Nurse Navigator Lenn Aran, MD as Consulting Physician (Radiation Oncology) Melanee Annah BROCKS, MD as Medical Oncologist (Oncology)   Name of the patient: Cathy Bray  [id number]  March 01, 1986   Date of visit: 04/07/24  Diagnosis-endometrial cancer  Chief complaint/ Reason for visit- follow-up for hot flashes due to surgical premature menopause  Heme/Onc history:  Oncology History  Endometrial cancer (HCC)  01/20/2024 Initial Diagnosis   Endometrial cancer (HCC)   03/04/2024 Cancer Staging   Staging form: Corpus Uteri - Carcinoma and Carcinosarcoma, AJCC 8th Edition and FIGO 2023 - Pathologic stage from 03/04/2024: FIGO Stage IC, calculated as Stage IA (pT1a, pN0(sn), cM0, MMRd-, p53-) - Signed by Melanee Annah BROCKS, MD on 03/04/2024 Method of lymph node assessment: Sentinel lymph node biopsy   03/08/2024 Genetic Testing   Negative genetic testing. No  pathogenic variants identified on the [employer] CancerNext+RNA Panel. The report date is 03/08/2024.  The [employer] CancerNext+RNAinsight Panel includes sequencing, rearrangement analysis, and RNA analysis for the following 40 genes: APC, ATM, BAP1, BARD1, BMPR1A, BRCA1, BRCA2, BRIP1, CDH1, CDKN2A, CHEK2, FH, FLCN, MET, MLH1, MSH2, MSH6, MUTYH, NF1, NTHL1, PALB2, PMS2, PTEN, RAD51C, RAD51D, RPS20, SMAD4, STK11, TP53, TSC1, TSC2, and VHL (sequencing and deletion/duplication); AXIN2, HOXB13, MBD4, MSH3, POLD1 and POLE (sequencing only); EPCAM and GREM1 (deletion/duplication only).   03/11/2024 -  Chemotherapy   Patient is on Treatment Plan : UTERINE Carboplatin  AUC 6 + Paclitaxel  q21d      Interval history-patient is a 38 year old female with above history of endometrial cancer, currently receiving adjuvant chemotherapy who agrees to telemedicine visit today to follow-up on her hot flashes.  At last visit we changed Veozah  to morning dosing and added gabapentin  at night.  She says that overall she is tolerating this regimen better and feels like she is having less hot lashes.  She feels fatigued and general malaise since her most recent chemotherapy.  Otherwise denies complaints.  She was having trouble sleeping and ongoing nausea which is improved.  Review of systems- Review of Systems  Constitutional:  Positive for chills and malaise/fatigue. Negative for fever and weight loss.  HENT:  Negative for hearing loss, nosebleeds, sore throat and tinnitus.   Eyes:  Negative for blurred vision and double vision.  Respiratory:  Negative for cough, hemoptysis, shortness of breath and wheezing.   Cardiovascular:  Negative for chest pain, palpitations and leg swelling.  Gastrointestinal:  Positive for nausea. Negative for abdominal pain, blood in stool, constipation, diarrhea, melena and vomiting.  Genitourinary:  Negative  for dysuria and urgency.  Musculoskeletal:  Negative for back pain, falls, joint pain and myalgias.   Skin:  Negative for itching and rash.  Neurological:  Positive for sensory change. Negative for dizziness, tingling, loss of consciousness, weakness and headaches.  Endo/Heme/Allergies:  Negative for environmental allergies. Does not bruise/bleed easily.  Psychiatric/Behavioral:  Negative for depression. The patient is not nervous/anxious and does not have insomnia.     Allergies  Allergen Reactions   Prochlorperazine      It caused me to feel uneasy... jittery.  I felt like even when trying to sit still my body would still move.   Shrimp Extract    Shrimp [Shellfish Allergy] Hives   Past Medical History:  Diagnosis Date   Dysplasia of cervix    Hematuria    History of LEEP (loop electrosurgical excision procedure) of cervix complicating pregnancy    Infertility, female    Missed ab    Nausea and vomiting during pregnancy    Ovarian cyst    LEFT- 2.4CM    S/P urgent classical cesarean section 01/08/2015   Done for malpresentation (fetal arm in vagina) at [redacted]w[redacted]d s/p PPROM     Second trimester bleeding    Past Surgical History:  Procedure Laterality Date   CESAREAN SECTION N/A 01/08/2015   Procedure: CESAREAN SECTION;  Surgeon: Gloris DELENA Hugger, MD;  Location: [facility] ORS;  Service: Obstetrics;  Laterality: N/A;  classical on uterus    LAPAROTOMY N/A 01/23/2015   Procedure: EXPLORATORY LAPAROTOMY;  Surgeon: Gladis DELENA Dollar, MD;  Location: [facility] ORS;  Service: Gynecology;  Laterality: N/A;   LEEP  2012   [facility]   Social History   Socioeconomic History   Marital status: Married    Spouse name: Not on file   Number of children: 3   Years of education: Not on file   Highest education level: Not on file  Occupational History   Not on file  Tobacco Use   Smoking status: Never   Smokeless tobacco: Never  Vaping Use   Vaping status: Never Used  Substance and Sexual Activity   Alcohol use: No   Drug use: No   Sexual activity: Not Currently    Birth control/protection: None  Other  Topics Concern   Not on file  Social History Narrative   Not on file   Social Drivers of Health   Financial Resource Strain: Low Risk  (02/08/2024)   Received from [facility]   Overall Financial Resource Strain (CARDIA)    Difficulty of Paying Living Expenses: Not hard at all  Food Insecurity: No Food Insecurity (03/04/2024)   Hunger Vital Sign    Worried About Running Out of Food in the Last Year: Never true    Ran Out of Food in the Last Year: Never true  Transportation Needs: No Transportation Needs (03/04/2024)   PRAPARE - Administrator, Civil Service (Medical): No    Lack of Transportation (Non-Medical): No  Physical Activity: Sufficiently Active (11/12/2017)   Exercise Vital Sign    Days of Exercise per Week: 5 days    Minutes of Exercise per Session: 30 min  Stress: Not on file  Social Connections: Not on file  Intimate Partner Violence: Not At Risk (03/04/2024)   Humiliation, Afraid, Rape, and Kick questionnaire    Fear of Current or Ex-Partner: No    Emotionally Abused: No    Physically Abused: No    Sexually Abused: No   Family History  Problem Relation Age of Onset   Hypertension Mother    Diabetes Father    Heart disease Neg Hx    Breast cancer Neg Hx    Colon cancer Neg Hx    Ovarian cancer Neg Hx     Current Outpatient Medications:    acetaminophen  (TYLENOL ) 325 MG tablet, Take 650 mg by mouth., Disp: , Rfl:    dexamethasone  (DECADRON ) 4 MG tablet, Take 2 tablets (8 mg total) by mouth daily for 3 days. Start the day after chemotherapy. Take with food., Disp: 30 tablet, Rfl: 1   Fezolinetant  (VEOZAH ) 45 MG TABS, Take 1 tablet (45 mg total) by mouth daily., Disp: 30 tablet, Rfl: 2   fluticasone  (FLONASE ) 50 MCG/ACT nasal spray, 2 spray in each nostril Nasally Once a day, Disp: , Rfl:    gabapentin  (NEURONTIN ) 300 MG capsule, Take 1 capsule (300 mg total) by mouth at bedtime., Disp: 30 capsule, Rfl: 0   ibuprofen  (ADVIL ) 600 MG tablet,  Take 600 mg by mouth every 6 (six) hours as needed., Disp: , Rfl:    lidocaine -prilocaine  (EMLA ) cream, Apply to affected area once, Disp: 30 g, Rfl: 3   olopatadine (PATANOL) 0.1 % ophthalmic solution, Place 1 drop into both eyes., Disp: , Rfl:    ondansetron  (ZOFRAN ) 4 MG tablet, Take 4 mg by mouth., Disp: , Rfl:    ondansetron  (ZOFRAN ) 8 MG tablet, Take 1 tablet (8 mg total) by mouth every 8 (eight) hours as needed for nausea or vomiting. Start on the third day after chemotherapy., Disp: 30 tablet, Rfl: 1   promethazine  (PHENERGAN ) 25 MG tablet, Take 0.5 tablets (12.5 mg total) by mouth every 8 (eight) hours as needed for nausea or vomiting., Disp: 30 tablet, Rfl: 0   senna-docusate (SENOKOT-S) 8.6-50 MG tablet, Take 2 tablets by mouth., Disp: , Rfl:    traMADol  (ULTRAM ) 50 MG tablet, Take 1 tablet (50 mg total) by mouth every 8 (eight) hours as needed. For pain unrelieved by tylenol , Disp: 30 tablet, Rfl: 0  Physical exam: Exam limited due to telemedicine  There were no vitals filed for this visit. Physical Exam Constitutional:      Comments: Fatigued appearing.  Laying in bed.  Neurological:     Mental Status: She is alert and oriented to person, place, and time.  Psychiatric:        Mood and Affect: Mood normal.        Behavior: Behavior normal.     Assessment and plan- Patient is a 38 y.o. female receiving adjuvant carbo-taxol  chemotherapy who presents for follow up of:    Hot Flashes-secondary to surgical premature menopause.  We rotated Veozah  to daytime dosing and she is now taking gabapentin  300 mg at night.  Tolerating better with improved symptom control.  Pain- Joint aches and pains post chemo. Primarily legs but some in arms. On tylenol  750 mg q6h. Added claritin  daily with improvement in symptoms. Depression-encouraged her to follow-up with [facility] for counseling. Medication side effect- secondary to pre-meds vs chemo. resolved same day Endometrial cancer-she has now  completed cycle 2 of planned 3 treatments.  I will refer her to rad onc for planned radiation after she completes her chemotherapy.  Disposition:  Follow-up as scheduled  Visit Diagnosis 1. Vasomotor symptoms due to menopause   2. Surgical menopause, symptomatic   3. Endometrial cancer [facility])    Patient expressed understanding and was in agreement with this plan. She also understands that She can call clinic at  any time with any questions, concerns, or complaints.   I discussed the assessment and treatment plan with the patient. The patient was provided an opportunity to ask questions and all were answered. The patient agreed with the plan and demonstrated an understanding of the instructions.   The patient was advised to call back or seek an in-person evaluation if the symptoms worsen or if the condition fails to improve as anticipated.   I spent 15 minutes face-to-face video visit time dedicated to the care of this patient on the date of this encounter to include pre-visit review of previous note, Dr Darold recent med-onc note, face-to-face time with the patient, and post visit ordering of testing/documentation.   Thank you for allowing me to participate in the care of this very pleasant patient.   Tinnie Dawn, DNP, AGNP-[facility]

## 2024-04-08 ENCOUNTER — Encounter: Payer: Self-pay | Admitting: Oncology

## 2024-04-14 ENCOUNTER — Encounter: Payer: Self-pay | Admitting: Oncology

## 2024-04-14 ENCOUNTER — Other Ambulatory Visit: Payer: Self-pay | Admitting: Nurse Practitioner

## 2024-04-20 ENCOUNTER — Ambulatory Visit
Admission: RE | Admit: 2024-04-20 | Discharge: 2024-04-20 | Disposition: A | Discharge status: Home / Self Care | Source: Ambulatory Visit | Attending: Radiation Oncology | Admitting: Radiation Oncology

## 2024-04-20 ENCOUNTER — Encounter: Payer: Self-pay | Admitting: Radiation Oncology

## 2024-04-20 VITALS — BP 123/89 | HR 100 | Temp 98.2°F | Resp 16 | Ht 64.0 in | Wt 163.2 lb

## 2024-04-20 DIAGNOSIS — Z9221 Personal history of antineoplastic chemotherapy: Secondary | ICD-10-CM | POA: Insufficient documentation

## 2024-04-20 DIAGNOSIS — C541 Malignant neoplasm of endometrium: Secondary | ICD-10-CM | POA: Insufficient documentation

## 2024-04-20 NOTE — Progress Notes (Signed)
 Radiation Oncology Follow up Note  Name: Cathy Bray   Date:   04/20/2024 MRN:  [id number] DOB: Nov 29, 1985    This 38 y.o. 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  I originally consulted back in September..  She has now completed 3 cycles of CarboTaxol.  She tolerated her treatments well although developed significant pain syndrome with Neulasta which was held for her third cycle.  Recommendation early on was for vaginal brachytherapy after completion of chemotherapy she is seen today she is having no increased lower Neri tract symptoms no diarrhea no vaginal discharge.  COMPLICATIONS OF TREATMENT: none  FOLLOW UP COMPLIANCE: keeps appointments   PHYSICAL EXAM:  BP 123/89 (BP Location: Left Arm)   Pulse 100   Temp 98.2 F (36.8 C)   Resp 16   Ht 5' 4 (1.626 m)   Wt 163 lb 3.2 oz (74 kg)   LMP 11/22/2023 (Approximate)   BMI 28.01 kg/m  Well-developed well-nourished patient in NAD. HEENT reveals PERLA, EOMI, discs not visualized.  Oral cavity is clear. No oral mucosal lesions are identified. Neck is clear without evidence of cervical or supraclavicular adenopathy. Lungs are clear to A&P. Cardiac examination is essentially unremarkable with regular rate and rhythm without murmur rub or thrill. Abdomen is benign with no organomegaly or masses noted. Motor sensory and DTR levels are equal and symmetric in the upper and lower extremities. Cranial nerves II through XII are grossly intact. Proprioception is intact. No peripheral adenopathy or edema is identified. No motor or sensory levels are noted. Crude visual fields are within normal  range.  RADIOLOGY RESULTS: No current films for review  PLAN: At this time I have not recommended vaginal brachytherapy.  I will proceed with 23.5 Gray in 5 fractions every other day.  Risks and benefits of treatment including possible increased lower Neri tract symptoms possible fatigue possible vaginal stenosis for which dilator would be recommended all were reviewed in detail with the patient.  She comprehends my treatment plan well.  I will have another few weeks of break from her chemotherapy and have set her up for simulation in early November.  I would like to take this opportunity to thank you for allowing me to participate in the care of your patient.SABRA Marcey Penton, MD

## 2024-04-22 ENCOUNTER — Encounter: Payer: Self-pay | Admitting: Oncology

## 2024-04-22 ENCOUNTER — Other Ambulatory Visit: Payer: Self-pay | Admitting: Nurse Practitioner

## 2024-04-22 ENCOUNTER — Inpatient Hospital Stay (HOSPITAL_BASED_OUTPATIENT_CLINIC_OR_DEPARTMENT_OTHER): Admitting: Oncology

## 2024-04-22 ENCOUNTER — Inpatient Hospital Stay

## 2024-04-22 VITALS — BP 127/94 | HR 108

## 2024-04-22 VITALS — BP 117/88 | HR 104 | Temp 97.8°F | Resp 18 | Ht 64.0 in | Wt 163.4 lb

## 2024-04-22 DIAGNOSIS — R7989 Other specified abnormal findings of blood chemistry: Secondary | ICD-10-CM

## 2024-04-22 DIAGNOSIS — C541 Malignant neoplasm of endometrium: Secondary | ICD-10-CM

## 2024-04-22 DIAGNOSIS — Z5111 Encounter for antineoplastic chemotherapy: Secondary | ICD-10-CM

## 2024-04-22 LAB — CBC WITH DIFFERENTIAL (CANCER CENTER ONLY)
Abs Immature Granulocytes: 0.03 K/uL (ref 0.00–0.07)
Basophils Absolute: 0 K/uL (ref 0.0–0.1)
Basophils Relative: 1 %
Eosinophils Absolute: 0 K/uL (ref 0.0–0.5)
Eosinophils Relative: 1 %
HCT: 33.5 % — ABNORMAL LOW (ref 36.0–46.0)
Hemoglobin: 11.5 g/dL — ABNORMAL LOW (ref 12.0–15.0)
Immature Granulocytes: 1 %
Lymphocytes Relative: 40 %
Lymphs Abs: 1.5 K/uL (ref 0.7–4.0)
MCH: 31.9 pg (ref 26.0–34.0)
MCHC: 34.3 g/dL (ref 30.0–36.0)
MCV: 92.8 fL (ref 80.0–100.0)
Monocytes Absolute: 0.3 K/uL (ref 0.1–1.0)
Monocytes Relative: 9 %
Neutro Abs: 1.8 K/uL (ref 1.7–7.7)
Neutrophils Relative %: 48 %
Platelet Count: 182 K/uL (ref 150–400)
RBC: 3.61 MIL/uL — ABNORMAL LOW (ref 3.87–5.11)
RDW: 14.1 % (ref 11.5–15.5)
WBC Count: 3.7 K/uL — ABNORMAL LOW (ref 4.0–10.5)
nRBC: 0 % (ref 0.0–0.2)

## 2024-04-22 LAB — CMP (CANCER CENTER ONLY)
ALT: 137 U/L — ABNORMAL HIGH (ref 0–44)
AST: 60 U/L — ABNORMAL HIGH (ref 15–41)
Albumin: 3.8 g/dL (ref 3.5–5.0)
Alkaline Phosphatase: 127 U/L — ABNORMAL HIGH (ref 38–126)
Anion gap: 9 (ref 5–15)
BUN: 10 mg/dL (ref 6–20)
CO2: 24 mmol/L (ref 22–32)
Calcium: 8.9 mg/dL (ref 8.9–10.3)
Chloride: 105 mmol/L (ref 98–111)
Creatinine: 0.54 mg/dL (ref 0.44–1.00)
GFR, Estimated: >60 mL/min (ref >60)
Glucose, Bld: 99 mg/dL (ref 70–99)
Potassium: 3.5 mmol/L (ref 3.5–5.1)
Sodium: 138 mmol/L (ref 135–145)
Total Bilirubin: 0.7 mg/dL (ref 0.0–1.2)
Total Protein: 6.7 g/dL (ref 6.5–8.1)

## 2024-04-22 MED ORDER — SODIUM CHLORIDE 0.9 % IV SOLN
INTRAVENOUS | Status: DC
  Filled 2024-04-22: qty 250

## 2024-04-22 MED ORDER — APREPITANT 130 MG/18ML IV EMUL
130.0000 mg | Freq: Once | INTRAVENOUS | Status: AC
  Administered 2024-04-22: 130 mg via INTRAVENOUS
  Filled 2024-04-22: qty 18

## 2024-04-22 MED ORDER — SODIUM CHLORIDE 0.9 % IV SOLN
175.0000 mg/m2 | Freq: Once | INTRAVENOUS | Status: AC
  Administered 2024-04-22: 312 mg via INTRAVENOUS
  Filled 2024-04-22: qty 52

## 2024-04-22 MED ORDER — LORAZEPAM 2 MG/ML IJ SOLN
0.5000 mg | Freq: Once | INTRAMUSCULAR | Status: AC
  Administered 2024-04-22: 0.5 mg via INTRAVENOUS
  Filled 2024-04-22: qty 1

## 2024-04-22 MED ORDER — SODIUM CHLORIDE 0.9 % IV SOLN
770.4000 mg | Freq: Once | INTRAVENOUS | Status: DC
  Filled 2024-04-22 (×2): qty 77

## 2024-04-22 MED ORDER — SODIUM CHLORIDE 0.9 % IV SOLN
770.0000 mg | Freq: Once | INTRAVENOUS | Status: AC
  Administered 2024-04-22: 770 mg via INTRAVENOUS
  Filled 2024-04-22: qty 77

## 2024-04-22 MED ORDER — FAMOTIDINE IN NACL 20-0.9 MG/50ML-% IV SOLN
20.0000 mg | Freq: Once | INTRAVENOUS | Status: AC
  Administered 2024-04-22: 20 mg via INTRAVENOUS
  Filled 2024-04-22: qty 50

## 2024-04-22 MED ORDER — DEXAMETHASONE SOD PHOSPHATE PF 10 MG/ML IJ SOLN
10.0000 mg | Freq: Once | INTRAMUSCULAR | Status: AC
  Administered 2024-04-22: 10 mg via INTRAVENOUS

## 2024-04-22 MED ORDER — PALONOSETRON HCL INJECTION 0.25 MG/5ML
0.2500 mg | Freq: Once | INTRAVENOUS | Status: AC
  Administered 2024-04-22: 0.25 mg via INTRAVENOUS
  Filled 2024-04-22: qty 5

## 2024-04-22 MED ORDER — DIPHENHYDRAMINE HCL 50 MG/ML IJ SOLN
50.0000 mg | Freq: Once | INTRAMUSCULAR | Status: AC
  Administered 2024-04-22: 50 mg via INTRAVENOUS
  Filled 2024-04-22: qty 1

## 2024-04-22 NOTE — Progress Notes (Signed)
 1405: Pt reports Severe Nausea, pt reports she does not tolerate Compazine  well. Pt received Aloxi  prior to treatment. Per Dr. Melanee okay to give Ativan IV 0.5mg .

## 2024-04-22 NOTE — Progress Notes (Signed)
 Patient feeling well, still has some leg pain here & there, but overall okay. No new or acute concerns at this time.

## 2024-04-22 NOTE — Progress Notes (Signed)
 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 Leonce and subsequently seen by GYN oncology for concerns of endometrial cancer.  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  IHC showed tumor cells positive for PAX8 and ER while negative for Napsin A p53 p16 Calretinin TTF-1 and WT1.  Also noted to have high-grade SIL on Pap.     She underwent TAH/BSO sentinel lymph node biopsy omentectomy on 02/02/2024.  She was noted to have multiple loops of ileum and ileal mesentery adherent to the anterior abdominal wall uterine fundus and pelvic sidewall.  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  4.5 cm.  Myometrial invasion absent.  Lymphatic invasion absent.  Cervix and serosa negative for malignancy.  Left and right ovary and fallopian tube negative for malignancy.  2 pelvic lymph nodes on the left and 5 on the right negative for malignancy.  Omentum resection showed benign fibroadipose tissue.   Patient seen by GYN oncology.  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  History of Present Illness   Cathy Bray is a 38 year old female with endometrial cancer who presents for her third chemotherapy treatment.  She is undergoing her third and final chemotherapy session today. Follow-up appointments for radiation therapy are scheduled, including a measurement session on November 5th and treatments on November 10th and 12th. A follow-up with GYN oncology is set for December 10th.  She experiences intermittent neuropathy in her legs, which is less severe than during her first treatment. No significant nausea or vomiting, but she notes increased tiredness.  She experienced soreness in her stomach after chemotherapy, particularly after the second session, with a burning sensation near the incision site. This symptom was transient and is not currently present.  She manages pain with Tylenol .    ECOG PS- 0 Pain scale- 0   Review of systems- Review of Systems  Constitutional:  Positive for malaise/fatigue. Negative for chills, fever and weight loss.  HENT:  Negative for congestion, ear discharge and nosebleeds.   Eyes:  Negative for blurred vision.  Respiratory:  Negative for cough, hemoptysis, sputum production, shortness of breath and wheezing.   Cardiovascular:  Negative for chest pain, palpitations, orthopnea and claudication.  Gastrointestinal:  Negative for abdominal pain, blood in stool, constipation, diarrhea, heartburn, melena, nausea and vomiting.  Genitourinary:  Negative for dysuria, flank pain, frequency, hematuria and urgency.  Musculoskeletal:  Negative for back pain, joint pain and myalgias.  Skin:  Negative for rash.  Neurological:  Negative for dizziness, tingling, focal weakness, seizures, weakness and headaches.  Endo/Heme/Allergies:  Does not bruise/bleed  easily.  Psychiatric/Behavioral:  Negative for depression and suicidal ideas. The patient does not have insomnia.        Allergies  Allergen Reactions   Prochlorperazine      It caused me to feel uneasy... jittery.  I felt like even when trying to sit still my body would still move.   Shrimp Extract    Shrimp [Shellfish Allergy] Hives     Past Medical History:  Diagnosis Date   Dysplasia of cervix    Hematuria    History of LEEP (loop electrosurgical excision procedure) of cervix complicating pregnancy    Infertility, female    Missed ab    Nausea and vomiting during pregnancy    Ovarian cyst    LEFT- 2.4CM    S/P urgent classical cesarean section 01/08/2015   Done for malpresentation (fetal arm in vagina) at [redacted]w[redacted]d s/p PPROM     Second trimester bleeding      Past Surgical History:  Procedure Laterality Date   CESAREAN SECTION N/A 01/08/2015   Procedure: CESAREAN SECTION;  Surgeon: Gloris DELENA Hugger, MD;  Location: [facility];  Service: Obstetrics;  Laterality: N/A;  classical on uterus    LAPAROTOMY N/A 01/23/2015   Procedure: EXPLORATORY LAPAROTOMY;  Surgeon: Gladis DELENA Dollar, MD;  Location: [facility] ORS;  Service: Gynecology;  Laterality: N/A;   LEEP  2012   [facility]    Social History   Socioeconomic History   Marital status: Married    Spouse name: Not on file   Number of children: 3   Years of education: Not on file   Highest education level: Not on file  Occupational History   Not on file  Tobacco Use   Smoking status: Never   Smokeless tobacco: Never  Vaping Use   Vaping status: Never Used  Substance and Sexual Activity   Alcohol use: No   Drug use: No   Sexual activity: Not Currently    Birth control/protection: None  Other Topics Concern   Not on file  Social History Narrative   Not on file   Social Drivers of Health   Financial Resource Strain: Low Risk  (02/08/2024)   Received from [facility]   Overall Financial Resource Strain (CARDIA)    Difficulty of Paying Living Expenses: Not hard at all  Food Insecurity: No Food Insecurity (03/04/2024)   Hunger  Vital Sign    Worried About Running Out of Food in the Last Year: Never true    Ran Out of Food in the Last Year: Never true  Transportation Needs: No Transportation Needs (03/04/2024)   PRAPARE - Administrator, Civil Service (Medical): No    Lack of Transportation (Non-Medical): No  Physical Activity: Sufficiently Active (11/12/2017)   Exercise Vital Sign    Days of Exercise per Week: 5 days    Minutes of Exercise per Session: 30 min  Stress: Not on file  Social Connections: Not on file  Intimate Partner Violence: Not At Risk (03/04/2024)   Humiliation, Afraid, Rape, and Kick questionnaire    Fear of Current or Ex-Partner: No    Emotionally Abused: No    Physically Abused: No    Sexually Abused: No    Family History  Problem Relation Age of Onset   Hypertension Mother    Diabetes Father    Heart disease Neg Hx    Breast cancer Neg Hx    Colon cancer Neg Hx    Ovarian cancer Neg Hx  Current Outpatient Medications:    acetaminophen  (TYLENOL ) 325 MG tablet, Take 650 mg by mouth., Disp: , Rfl:    dexamethasone  (DECADRON ) 4 MG tablet, Take 2 tablets (8 mg total) by mouth daily for 3 days. Start the day after chemotherapy. Take with food., Disp: 30 tablet, Rfl: 1   Fezolinetant  (VEOZAH ) 45 MG TABS, Take 1 tablet (45 mg total) by mouth daily., Disp: 30 tablet, Rfl: 2   fluticasone  (FLONASE ) 50 MCG/ACT nasal spray, 2 spray in each nostril Nasally Once a day, Disp: , Rfl:    gabapentin  (NEURONTIN ) 300 MG capsule, TAKE 1 CAPSULE BY MOUTH EVERYDAY AT BEDTIME, Disp: 30 capsule, Rfl: 0   ibuprofen  (ADVIL ) 600 MG tablet, Take 600 mg by mouth every 6 (six) hours as needed., Disp: , Rfl:    lidocaine -prilocaine  (EMLA ) cream, Apply to affected area once, Disp: 30 g, Rfl: 3   olopatadine (PATANOL) 0.1 % ophthalmic solution, Place 1 drop into both eyes., Disp: , Rfl:    ondansetron  (ZOFRAN ) 4 MG tablet, Take 4 mg by mouth., Disp: , Rfl:    ondansetron  (ZOFRAN ) 8 MG tablet, Take 1  tablet (8 mg total) by mouth every 8 (eight) hours as needed for nausea or vomiting. Start on the third day after chemotherapy., Disp: 30 tablet, Rfl: 1   promethazine  (PHENERGAN ) 25 MG tablet, Take 0.5 tablets (12.5 mg total) by mouth every 8 (eight) hours as needed for nausea or vomiting., Disp: 30 tablet, Rfl: 0   senna-docusate (SENOKOT-S) 8.6-50 MG tablet, Take 2 tablets by mouth., Disp: , Rfl:    traMADol  (ULTRAM ) 50 MG tablet, Take 1 tablet (50 mg total) by mouth every 8 (eight) hours as needed. For pain unrelieved by tylenol , Disp: 30 tablet, Rfl: 0 No current facility-administered medications for this visit.  Facility-Administered Medications Ordered in Other Visits:    0.9 %  sodium chloride  infusion, , Intravenous, Continuous, Melanee Annah BROCKS, MD, Last Rate: 10 mL/hr at 04/22/24 0907, New Bag at 04/22/24 0907   CARBOplatin  (PARAPLATIN ) 770.4 mg in sodium chloride  0.9 % 250 mL chemo infusion, 770.4 mg, Intravenous, Once, Melanee Annah BROCKS, MD   dexamethasone  (DECADRON ) injection 10 mg, 10 mg, Intravenous, Once, Melanee Annah BROCKS, MD   famotidine  (PEPCID ) IVPB 20 mg premix, 20 mg, Intravenous, Once, Melanee Annah BROCKS, MD   PACLitaxel  (TAXOL ) 312 mg in sodium chloride  0.9 % 500 mL chemo infusion (> 80mg /m2), 175 mg/m2 (Treatment Plan Recorded), Intravenous, Once, Melanee Annah BROCKS, MD  Physical exam:  Vitals:   04/22/24 0830  BP: 117/88  Pulse: (!) 104  Resp: 18  Temp: 97.8 F (36.6 C)  TempSrc: Tympanic  SpO2: 100%  Weight: 163 lb 6.4 oz (74.1 kg)  Height: 5' 4 (1.626 m)   Physical Exam Cardiovascular:     Rate and Rhythm: Normal rate and regular rhythm.     Heart sounds: Normal heart sounds.  Pulmonary:     Effort: Pulmonary effort is normal.     Breath sounds: Normal breath sounds.  Skin:    General: Skin is warm and dry.  Neurological:     Mental Status: She is alert and oriented to person, place, and time.      I have personally reviewed labs listed below:    Latest Ref Rng  & Units 04/22/2024    8:09 AM  CMP  Glucose 70 - 99 mg/dL 99   BUN 6 - 20 mg/dL 10   Creatinine 9.55 - 1.00 mg/dL 9.45   Sodium 864 -  145 mmol/L 138   Potassium 3.5 - 5.1 mmol/L 3.5   Chloride 98 - 111 mmol/L 105   CO2 22 - 32 mmol/L 24   Calcium  8.9 - 10.3 mg/dL 8.9   Total Protein 6.5 - 8.1 g/dL 6.7   Total Bilirubin 0.0 - 1.2 mg/dL 0.7   Alkaline Phos 38 - 126 U/L 127   AST 15 - 41 U/L 60   ALT 0 - 44 U/L 137       Latest Ref Rng & Units 04/22/2024    8:09 AM  CBC  WBC 4.0 - 10.5 K/uL 3.7   Hemoglobin 12.0 - 15.0 g/dL 88.4   Hematocrit 63.9 - 46.0 % 33.5   Platelets 150 - 400 K/uL 182      Assessment and plan- Patient is a 38 y.o. 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  She is here for on treatment assessment prior to cycle 3 of adjuvant CarboTaxol chemotherapy  Assessment and Plan    Endometrial cancer Early stage, completing adjuvant chemotherapy today. Radiation therapy scheduled. CT scan timing post-radiation pending. - Administer full dose of chemotherapy today. - Coordinate with radiation oncologist for brachytherapy starting November 10th. - Discuss with Dr. Sharleen regarding timing of CT scan post-radiation. - I will see her back in 3 months with CBC with differential and CMP.  She does not have a port  Chemotherapy-related fatigue Increased fatigue reported, no other significant symptoms.  Chemotherapy-induced peripheral neuropathy Intermittent, less severe neuropathy in legs. Symptoms transient. - Proceed with full dose of chemotherapy.  Abnormal liver function tests during chemotherapy AST was 125 3 weeks ago and 137 today.  Alkaline phosphatase 137 last time and 127 today.  AST mildly elevated at 60.  No dose reduction necessary. - Repeat liver function tests in 2- three weeks.         Visit Diagnosis 1. Endometrial cancer (HCC)   2. Encounter for antineoplastic chemotherapy   3. Abnormal LFTs       Dr. Annah Skene, MD, MPH [facility] [phone] 04/22/2024 9:28 AM

## 2024-04-23 ENCOUNTER — Encounter: Payer: Self-pay | Admitting: Nurse Practitioner

## 2024-04-24 ENCOUNTER — Other Ambulatory Visit: Payer: Self-pay

## 2024-04-25 ENCOUNTER — Other Ambulatory Visit: Payer: Self-pay

## 2024-04-25 DIAGNOSIS — C541 Malignant neoplasm of endometrium: Secondary | ICD-10-CM

## 2024-04-26 ENCOUNTER — Other Ambulatory Visit: Payer: Self-pay | Admitting: *Deleted

## 2024-04-26 DIAGNOSIS — C541 Malignant neoplasm of endometrium: Secondary | ICD-10-CM

## 2024-04-27 ENCOUNTER — Encounter: Payer: Self-pay | Admitting: Oncology

## 2024-04-27 ENCOUNTER — Telehealth: Payer: Self-pay | Admitting: *Deleted

## 2024-04-27 ENCOUNTER — Encounter: Payer: Self-pay | Admitting: Nurse Practitioner

## 2024-04-27 ENCOUNTER — Other Ambulatory Visit: Payer: Self-pay

## 2024-04-27 ENCOUNTER — Encounter: Payer: Self-pay | Admitting: Emergency Medicine

## 2024-04-27 ENCOUNTER — Emergency Department
Admission: EM | Admit: 2024-04-27 | Discharge: 2024-04-27 | Disposition: A | Discharge status: Home / Self Care | Attending: Emergency Medicine | Admitting: Emergency Medicine

## 2024-04-27 DIAGNOSIS — K6289 Other specified diseases of anus and rectum: Secondary | ICD-10-CM | POA: Diagnosis present

## 2024-04-27 DIAGNOSIS — Z5111 Encounter for antineoplastic chemotherapy: Secondary | ICD-10-CM | POA: Diagnosis not present

## 2024-04-27 DIAGNOSIS — R531 Weakness: Secondary | ICD-10-CM | POA: Insufficient documentation

## 2024-04-27 DIAGNOSIS — K5900 Constipation, unspecified: Secondary | ICD-10-CM | POA: Insufficient documentation

## 2024-04-27 DIAGNOSIS — R000 Tachycardia, unspecified: Secondary | ICD-10-CM | POA: Diagnosis not present

## 2024-04-27 LAB — CBC WITH DIFFERENTIAL/PLATELET
Abs Immature Granulocytes: 0.03 K/uL (ref 0.00–0.07)
Basophils Absolute: 0 K/uL (ref 0.0–0.1)
Basophils Relative: 1 %
Eosinophils Absolute: 0 K/uL (ref 0.0–0.5)
Eosinophils Relative: 0 %
HCT: 35.3 % — ABNORMAL LOW (ref 36.0–46.0)
Hemoglobin: 12.1 g/dL (ref 12.0–15.0)
Immature Granulocytes: 1 %
Lymphocytes Relative: 29 %
Lymphs Abs: 1.2 K/uL (ref 0.7–4.0)
MCH: 31.5 pg (ref 26.0–34.0)
MCHC: 34.3 g/dL (ref 30.0–36.0)
MCV: 91.9 fL (ref 80.0–100.0)
Monocytes Absolute: 0 K/uL — ABNORMAL LOW (ref 0.1–1.0)
Monocytes Relative: 1 %
Neutro Abs: 2.7 K/uL (ref 1.7–7.7)
Neutrophils Relative %: 68 %
Platelets: 312 K/uL (ref 150–400)
RBC: 3.84 MIL/uL — ABNORMAL LOW (ref 3.87–5.11)
RDW: 14.3 % (ref 11.5–15.5)
WBC: 4 K/uL (ref 4.0–10.5)
nRBC: 0 % (ref 0.0–0.2)

## 2024-04-27 LAB — COMPREHENSIVE METABOLIC PANEL WITH GFR
ALT: 150 U/L — ABNORMAL HIGH (ref 0–44)
AST: 54 U/L — ABNORMAL HIGH (ref 15–41)
Albumin: 4 g/dL (ref 3.5–5.0)
Alkaline Phosphatase: 95 U/L (ref 38–126)
Anion gap: 11 (ref 5–15)
BUN: 19 mg/dL (ref 6–20)
CO2: 30 mmol/L (ref 22–32)
Calcium: 9.1 mg/dL (ref 8.9–10.3)
Chloride: 100 mmol/L (ref 98–111)
Creatinine, Ser: 0.54 mg/dL (ref 0.44–1.00)
GFR, Estimated: >60 mL/min (ref >60)
Glucose, Bld: 117 mg/dL — ABNORMAL HIGH (ref 70–99)
Potassium: 3.5 mmol/L (ref 3.5–5.1)
Sodium: 141 mmol/L (ref 135–145)
Total Bilirubin: 0.6 mg/dL (ref 0.0–1.2)
Total Protein: 7 g/dL (ref 6.5–8.1)

## 2024-04-27 MED ORDER — LACTULOSE 10 GM/15ML PO SOLN
10.0000 g | Freq: Once | ORAL | Status: AC
Start: 2024-04-27 — End: 2024-04-27
  Administered 2024-04-27: 10 g via ORAL
  Filled 2024-04-27: qty 30

## 2024-04-27 MED ORDER — ONDANSETRON HCL 4 MG/2ML IJ SOLN
4.0000 mg | Freq: Once | INTRAMUSCULAR | Status: AC
  Administered 2024-04-27: 4 mg via INTRAVENOUS
  Filled 2024-04-27: qty 2

## 2024-04-27 MED ORDER — LACTULOSE 10 GM/15ML PO SOLN: 10.0000 g | Freq: Two times a day (BID) | ORAL | 0 refills | Status: AC | PRN

## 2024-04-27 MED ORDER — SODIUM CHLORIDE 0.9 % IV BOLUS
1000.0000 mL | Freq: Once | INTRAVENOUS | Status: AC
  Administered 2024-04-27: 1000 mL via INTRAVENOUS

## 2024-04-27 NOTE — ED Notes (Signed)
 Pt passing bowels at this time

## 2024-04-27 NOTE — ED Notes (Signed)
 No N/V.. mental status at baseline, respirations nonlabored. Assisted to wheelchair for dc. Understands dc instructions.

## 2024-04-27 NOTE — ED Provider Notes (Signed)
 [facility] Provider Note    Event Date/Time   First MD Initiated Contact with Patient 04/27/24 1548     (approximate)   History   Weakness and Generalized Body Aches   HPI  Cathy Bray is a 38 year old female with history of endometrial cancer presenting to the ER for evaluation of weakness.  Patient reports she has had generalized weakness and bodyaches since completing chemotherapy a few days ago.  More recently she has had issues with constipation with feelings of rectal discomfort.  Reports that her last full bowel movement was a few days ago, has been straining to have a bowel movement today without significant output.  Has been using a stool softener and laxative.  Denies abdominal pain, chest pain, shortness of breath, fevers.  I reviewed her oncology visit from 04/18/2024.  Patient was seen at that time for her third and final adjuvant chemotherapy session.  At that time she was reporting neuropathy, abdominal pain.  Plans to start radiation brachytherapy on 11/10.       Physical Exam   Triage Vital Signs: ED Triage Vitals  Encounter Vitals Group     BP 04/27/24 1542 111/60     Girls Systolic BP Percentile --      Girls Diastolic BP Percentile --      Boys Systolic BP Percentile --      Boys Diastolic BP Percentile --      Pulse Rate 04/27/24 1542 (!) 103     Resp 04/27/24 1542 16     Temp 04/27/24 1542 98.5 F (36.9 C)     Temp Source 04/27/24 1542 Oral     SpO2 04/27/24 1536 100 %     Weight 04/27/24 1540 165 lb (74.8 kg)     Height 04/27/24 1540 5' 3 (1.6 m)     Head Circumference --      Peak Flow --      Pain Score 04/27/24 1539 5     Pain Loc --      Pain Education --      Exclude from Growth Chart --     Most recent vital signs: Vitals:   04/27/24 1542 04/27/24 1550  BP: 111/60   Pulse: (!) 103   Resp: 16   Temp: 98.5 F (36.9 C)   SpO2: 100% 100%     General: Awake, interactive  CV:  Good peripheral  perfusion Resp:  Unlabored respirations, lungs clear to auscultation Abd:  Nondistended, soft, no significant tenderness to palpation Neuro:  Symmetric facial movement, fluid speech   ED Results / Procedures / Treatments   Labs (all labs ordered are listed, but only abnormal results are displayed) Labs Reviewed  CBC WITH DIFFERENTIAL/PLATELET - Abnormal; Notable for the following components:      Result Value   RBC 3.84 (*)    HCT 35.3 (*)    Monocytes Absolute 0.0 (*)    All other components within normal limits  COMPREHENSIVE METABOLIC PANEL WITH GFR - Abnormal; Notable for the following components:   Glucose, Bld 117 (*)    AST 54 (*)    ALT 150 (*)    All other components within normal limits     EKG EKG independently reviewed and interpreted by myself demonstrates:  EKG demonstrates sinus tachycardia at a rate of 107, PR 118, QRS 86, QTc 426, no acute ST changes  RADIOLOGY Imaging independently reviewed and interpreted by myself demonstrates:   Formal Radiology Read:  No results  found.  PROCEDURES:  Critical Care performed: No  Procedures   MEDICATIONS ORDERED IN ED: Medications  sodium chloride  0.9 % bolus 1,000 mL (1,000 mLs Intravenous New Bag/Given 04/27/24 1719)  ondansetron  (ZOFRAN ) injection 4 mg (4 mg Intravenous Given 04/27/24 1729)  lactulose (CHRONULAC) 10 GM/15ML solution 10 g (10 g Oral Given by Other 04/27/24 1616)     IMPRESSION / MDM / ASSESSMENT AND PLAN / ED COURSE  I reviewed the triage vital signs and the nursing notes.  Differential diagnosis includes, but is not limited to, anemia, electrolyte abnormality, medication adverse effect in the setting of recent chemotherapy, constipation, lower suspicion of bowel obstruction in the absence of nausea, vomiting, abdominal distention, low suspicion other acute intra-abdominal process given reassuring abdominal exam  Patient's presentation is most consistent with acute presentation with  potential threat to life or bodily function.  38 year old female presenting to the emergency department for evaluation of weakness and constipation.  Mild tachycardia on presentation.  Patient's chief complaint today primarily seems to be rectal discomfort in the setting of constipation.  She does have weakness but notes that this is not atypical for her after receiving chemotherapy.  No fevers, focal complaint suggestive of acute infection.  Will obtain labs, provide IV fluids, trial oral lactulose to see if this improves her rectal discomfort.   Clinical Course as of 04/27/24 1807  Wed Apr 27, 2024  1802 Labs overall reassuring.  Mild transaminitis noted, stable compared to prior.  After receiving lactulose patient was able to have a bowel movement and feels much improved with resolution of her rectal discomfort.  Delay in IV due to difficult access, but has been obtained and patient has IV fluids running.  No new complaints on reevaluation.  Patient is comfortable with discharge home and continued outpatient follow-up with her oncology team.  Discussed continued use of bowel regimen at home.  Will DC with short course of as needed lactulose for refractory constipation.  Strict return precautions provided.  Plan for discharge after completion of IV fluids.  With [NR]    Clinical Course User Index [NR] Levander Slate, MD     FINAL CLINICAL IMPRESSION(S) / ED DIAGNOSES   Final diagnoses:  Weakness  Constipation, unspecified constipation type  Rectal discomfort     Rx / DC Orders   ED Discharge Orders          Ordered    lactulose (CHRONULAC) 10 GM/15ML solution  2 times daily PRN        04/27/24 1807             Note:  This document was prepared using Dragon voice recognition software and may include unintentional dictation errors.   Levander Slate, MD 04/27/24 [phone]

## 2024-04-27 NOTE — ED Notes (Signed)
 Pt reports getting IM zofran  PTA by EMS

## 2024-04-27 NOTE — Discharge Instructions (Signed)
 You were seen in the emergency department today for evaluation of your constipation and rectal discomfort.  Your labs fortunately were overall reassuring.  I have included more information about constipation in your paperwork.  I sent a prescription for lactulose medicine that you can use if you have recurrent constipation despite using a regular bowel management.  Please continue to follow-up with your oncology team as directed.  Return to the ER for any new or worsening symptoms.

## 2024-04-27 NOTE — ED Triage Notes (Signed)
 Pt to ED via [facility] from home for generalized weakness, body aches, chills, and constipation. Pt has endometrial cancer, last round of chemo was last week.Pt was given Zofran  4 mg IM by EMS for nausea in route.

## 2024-04-27 NOTE — Telephone Encounter (Signed)
 Got the message from the patient with all the symptoms that she is having and she can come in tomorrow but just now I was told that she is on the way to the ER right now.

## 2024-04-27 NOTE — Telephone Encounter (Signed)
 Per Dr. Melanee If she willing to see Ortho for her knee pain?  If so we can refer.  I am okay to keep her out of work until the end of the year. Outbound call to patient; informed of above.  Patient is not in favor of an ortho referral at this time.  Patient reports pain subsided a little bit yesterday.  The first time she did chemo the pain was all over legs, like pins and needles.  It decreased after 1.5 - 2 weeks.  Pain is still there but has subsided some.  Advised patient to contact clinic if she would like to reconsider ortho referral.  Informed patient I would mail letter requested to keep out of work until the end of the year within the next week; will send patient a my chart message once in the mail. Patient verbalized understanding.

## 2024-04-27 NOTE — Telephone Encounter (Signed)
 If she willing to see Ortho for her knee pain?  If so we can refer.  I am okay to keep her out of work until the end of the year

## 2024-04-28 ENCOUNTER — Telehealth: Payer: Self-pay

## 2024-04-28 ENCOUNTER — Inpatient Hospital Stay

## 2024-04-28 ENCOUNTER — Encounter: Payer: Self-pay | Admitting: Oncology

## 2024-04-28 ENCOUNTER — Encounter: Payer: Self-pay | Admitting: Nurse Practitioner

## 2024-04-28 ENCOUNTER — Inpatient Hospital Stay (HOSPITAL_BASED_OUTPATIENT_CLINIC_OR_DEPARTMENT_OTHER): Admitting: Nurse Practitioner

## 2024-04-28 VITALS — BP 118/76 | HR 97

## 2024-04-28 VITALS — BP 105/88 | HR 116 | Temp 97.9°F | Resp 18 | Ht 63.0 in | Wt 160.4 lb

## 2024-04-28 DIAGNOSIS — E860 Dehydration: Secondary | ICD-10-CM

## 2024-04-28 DIAGNOSIS — C541 Malignant neoplasm of endometrium: Secondary | ICD-10-CM

## 2024-04-28 DIAGNOSIS — Z5189 Encounter for other specified aftercare: Secondary | ICD-10-CM

## 2024-04-28 DIAGNOSIS — Z5111 Encounter for antineoplastic chemotherapy: Secondary | ICD-10-CM | POA: Diagnosis not present

## 2024-04-28 MED ORDER — DEXAMETHASONE SOD PHOSPHATE PF 10 MG/ML IJ SOLN
5.0000 mg | Freq: Once | INTRAMUSCULAR | Status: AC
  Administered 2024-04-28: 5 mg via INTRAVENOUS

## 2024-04-28 MED ORDER — SODIUM CHLORIDE 0.9 % IV SOLN
Freq: Once | INTRAVENOUS | Status: AC
  Filled 2024-04-28: qty 250

## 2024-04-28 NOTE — Telephone Encounter (Signed)
 Update to question #11 part B faxed to [facility].

## 2024-04-28 NOTE — Progress Notes (Signed)
 Symptom Management Clinic  [facility] at [facility] A Department of the [place]. [facility] [address] [place], [place] [address] [phone] (phone) [phone] (fax)  Patient Care Team: [place], [place] [facility] as PCP - General Maurie Rayfield BIRCH, RN as Oncology Nurse Navigator Lenn Aran, MD as Consulting Physician (Radiation Oncology) Melanee Annah BROCKS, MD as Medical Oncologist (Oncology)   Name of the patient: Cathy Bray  [id number]  August 29, 1985   Date of visit: 04/28/24  Diagnosis- endometrial cancer  Chief complaint/ Reason for visit- constipation, weakness, myalgias  Heme/Onc history:  Oncology History  Endometrial cancer (HCC)  01/20/2024 Initial Diagnosis   Endometrial cancer (HCC)   03/04/2024 Cancer Staging   Staging form: Corpus Uteri - Carcinoma and Carcinosarcoma, AJCC 8th Edition and FIGO 2023 - Pathologic stage from 03/04/2024: FIGO Stage IC, calculated as Stage IA (pT1a, pN0(sn), cM0, MMRd-, p53-) - Signed by Melanee Annah BROCKS, MD on 03/04/2024 Method of lymph node assessment: Sentinel lymph node biopsy   03/08/2024 Genetic Testing   Negative genetic testing. No pathogenic variants identified on the [employer] CancerNext+RNA Panel. The report date is 03/08/2024.  The [facility] CancerNext+RNAinsight Panel includes sequencing, rearrangement analysis, and RNA analysis for the following 40 genes: APC, ATM, BAP1, BARD1, BMPR1A, BRCA1, BRCA2, BRIP1, CDH1, CDKN2A, CHEK2, FH, FLCN, MET, MLH1, MSH2, MSH6, MUTYH, NF1, NTHL1, PALB2, PMS2, PTEN, RAD51C, RAD51D, RPS20, SMAD4, STK11, TP53, TSC1, TSC2, and VHL (sequencing and deletion/duplication); AXIN2, HOXB13, MBD4, MSH3, POLD1 and POLE (sequencing only); EPCAM and GREM1 (deletion/duplication only).   03/11/2024 -  Chemotherapy   Patient is on Treatment Plan : UTERINE Carboplatin  AUC 6 + Paclitaxel  q21d       Interval history-patient is a 38 year old female with above history of endometrial  cancer now status post 3 cycles of adjuvant CarboTaxol chemotherapy.  Her last chemotherapy was on 04/22/2024.  She was seen in the emergency room yesterday with complaints of rectal pressure and constipation.  She said it had been a few days since her last bowel movement despite stool softener and laxative.  She received IV fluids and lactulose which resolved her symptoms.  She was discharged to home.  Today, she says that she feels that rectal pressure has resolved.  Continues to have generalized weakness and bodyaches.  Ongoing nausea which is relieved with antiemetics.  She is requesting IV fluids today.  Review of systems- Review of Systems  Constitutional:  Positive for malaise/fatigue. Negative for chills, fever and weight loss.  HENT:  Negative for hearing loss, nosebleeds, sore throat and tinnitus.   Eyes:  Negative for blurred vision and double vision.  Respiratory:  Negative for cough, hemoptysis, shortness of breath and wheezing.   Cardiovascular:  Negative for chest pain, palpitations and leg swelling.  Gastrointestinal:  Negative for abdominal pain, blood in stool, constipation, diarrhea, melena, nausea and vomiting.  Genitourinary:  Negative for dysuria and urgency.  Musculoskeletal:  Positive for joint pain and myalgias. Negative for back pain and falls.  Skin:  Negative for itching and rash.  Neurological:  Negative for dizziness, tingling, sensory change, loss of consciousness, weakness and headaches.  Endo/Heme/Allergies:  Negative for environmental allergies. Does not bruise/bleed easily.  Psychiatric/Behavioral:  Negative for depression. The patient is not nervous/anxious and does not have insomnia.      Allergies  Allergen Reactions   Prochlorperazine      It caused me to feel uneasy... jittery.  I felt like even when trying to sit still my body  would still move.   Shrimp Extract    Shrimp [Shellfish Allergy] Hives    Past Medical History:  Diagnosis Date   Dysplasia  of cervix    Hematuria    History of LEEP (loop electrosurgical excision procedure) of cervix complicating pregnancy    Infertility, female    Missed ab    Nausea and vomiting during pregnancy    Ovarian cyst    LEFT- 2.4CM    S/P urgent classical cesarean section 01/08/2015   Done for malpresentation (fetal arm in vagina) at [redacted]w[redacted]d s/p PPROM     Second trimester bleeding     Past Surgical History:  Procedure Laterality Date   CESAREAN SECTION N/A 01/08/2015   Procedure: CESAREAN SECTION;  Surgeon: Gloris DELENA Hugger, MD;  Location: [facility] OR[facility];  Service: Obstetrics;  Laterality: N/A;  classical on uterus    LAPAROTOMY N/A 01/23/2015   Procedure: EXPLORATORY LAPAROTOMY;  Surgeon: Gladis DELENA Dollar, MD;  Location: [facility] ORS;  Service: Gynecology;  Laterality: N/A;   LEEP  2012   [facility]    Social History   Socioeconomic History   Marital status: Married    Spouse name: Not on file   Number of children: 3   Years of education: Not on file   Highest education level: Not on file  Occupational History   Not on file  Tobacco Use   Smoking status: Never   Smokeless tobacco: Never  Vaping Use   Vaping status: Never Used  Substance and Sexual Activity   Alcohol use: No   Drug use: No   Sexual activity: Not Currently    Birth control/protection: None  Other Topics Concern   Not on file  Social History Narrative   Not on file   Social Drivers of Health   Financial Resource Strain: Low Risk  (02/08/2024)   Received from [facility]   Overall Financial Resource Strain (CARDIA)    Difficulty of Paying Living Expenses: Not hard at all  Food Insecurity: No Food Insecurity (03/04/2024)   Hunger Vital Sign    Worried About Running Out of Food in the Last Year: Never true    Ran Out of Food in the Last Year: Never true  Transportation Needs: No Transportation Needs (03/04/2024)   PRAPARE - Administrator, Civil Service (Medical): No    Lack of Transportation  (Non-Medical): No  Physical Activity: Sufficiently Active (11/12/2017)   Exercise Vital Sign    Days of Exercise per Week: 5 days    Minutes of Exercise per Session: 30 min  Stress: Not on file  Social Connections: Not on file  Intimate Partner Violence: Not At Risk (03/04/2024)   Humiliation, Afraid, Rape, and Kick questionnaire    Fear of Current or Ex-Partner: No    Emotionally Abused: No    Physically Abused: No    Sexually Abused: No    Family History  Problem Relation Age of Onset   Hypertension Mother    Diabetes Father    Heart disease Neg Hx    Breast cancer Neg Hx    Colon cancer Neg Hx    Ovarian cancer Neg Hx      Current Outpatient Medications:    acetaminophen  (TYLENOL ) 325 MG tablet, Take 650 mg by mouth., Disp: , Rfl:    dexamethasone  (DECADRON ) 4 MG tablet, Take 2 tablets (8 mg total) by mouth daily for 3 days. Start the day after chemotherapy. Take with food., Disp: 30 tablet, Rfl:  1   Fezolinetant  (VEOZAH ) 45 MG TABS, Take 1 tablet (45 mg total) by mouth daily., Disp: 30 tablet, Rfl: 2   fluticasone  (FLONASE ) 50 MCG/ACT nasal spray, 2 spray in each nostril Nasally Once a day, Disp: , Rfl:    gabapentin  (NEURONTIN ) 300 MG capsule, TAKE 1 CAPSULE BY MOUTH EVERYDAY AT BEDTIME, Disp: 30 capsule, Rfl: 0   ibuprofen  (ADVIL ) 600 MG tablet, Take 600 mg by mouth every 6 (six) hours as needed., Disp: , Rfl:    lactulose (CHRONULAC) 10 GM/15ML solution, Take 15 mLs (10 g total) by mouth 2 (two) times daily as needed for mild constipation., Disp: 236 mL, Rfl: 0   lidocaine -prilocaine  (EMLA ) cream, Apply to affected area once, Disp: 30 g, Rfl: 3   olopatadine (PATANOL) 0.1 % ophthalmic solution, Place 1 drop into both eyes., Disp: , Rfl:    ondansetron  (ZOFRAN ) 4 MG tablet, Take 4 mg by mouth., Disp: , Rfl:    ondansetron  (ZOFRAN ) 8 MG tablet, Take 1 tablet (8 mg total) by mouth every 8 (eight) hours as needed for nausea or vomiting. Start on the third day after chemotherapy.,  Disp: 30 tablet, Rfl: 1   promethazine  (PHENERGAN ) 25 MG tablet, Take 0.5 tablets (12.5 mg total) by mouth every 8 (eight) hours as needed for nausea or vomiting., Disp: 30 tablet, Rfl: 0   senna-docusate (SENOKOT-S) 8.6-50 MG tablet, Take 2 tablets by mouth., Disp: , Rfl:    traMADol  (ULTRAM ) 50 MG tablet, Take 1 tablet (50 mg total) by mouth every 8 (eight) hours as needed. For pain unrelieved by tylenol , Disp: 30 tablet, Rfl: 0  Physical exam:  Vitals:   04/28/24 0954  BP: 105/88  Pulse: (!) 116  Resp: 18  Temp: 97.9 F (36.6 C)  TempSrc: Tympanic  SpO2: 100%  Weight: 160 lb 6.4 oz (72.8 kg)  Height: 5' 3 (1.6 m)   Physical Exam Vitals reviewed.  Constitutional:      Appearance: She is not ill-appearing.     Comments: Fatigued appearing  Cardiovascular:     Rate and Rhythm: Regular rhythm. Tachycardia present.     Heart sounds: No murmur heard. Pulmonary:     Effort: No respiratory distress.  Abdominal:     General: There is no distension.     Tenderness: There is no abdominal tenderness. There is no guarding.  Musculoskeletal:        General: No deformity.  Skin:    Coloration: Skin is not jaundiced or pale.  Neurological:     Mental Status: She is alert and oriented to person, place, and time.  Psychiatric:        Mood and Affect: Mood normal.        Behavior: Behavior normal.        Latest Ref Rng & Units 04/27/2024    4:59 PM  CMP  Glucose 70 - 99 mg/dL 882   BUN 6 - 20 mg/dL 19   Creatinine 9.55 - 1.00 mg/dL 9.45   Sodium 864 - 854 mmol/L 141   Potassium 3.5 - 5.1 mmol/L 3.5   Chloride 98 - 111 mmol/L 100   CO2 22 - 32 mmol/L 30   Calcium  8.9 - 10.3 mg/dL 9.1   Total Protein 6.5 - 8.1 g/dL 7.0   Total Bilirubin 0.0 - 1.2 mg/dL 0.6   Alkaline Phos 38 - 126 U/L 95   AST 15 - 41 U/L 54   ALT 0 - 44 U/L 150  Latest Ref Rng & Units 04/27/2024    4:59 PM  CBC  WBC 4.0 - 10.5 K/uL 4.0   Hemoglobin 12.0 - 15.0 g/dL 87.8   Hematocrit 63.9 - 46.0  % 35.3   Platelets 150 - 400 K/uL 312    No images are attached to the encounter.  No results found.  Assessment and plan- Patient is a 38 y.o. female with history of stage Ia serous endometrial cancer status post 3 cycles of adjuvant CarboTaxol chemotherapy who presents to symptom management clinic for  Myalgias and arthralgias, convalescence following chemotherapy-likely secondary to chemotherapy and hormonal changes as she is now postmenopausal due to surgery.  Hormonal therapy is contraindicated based on ER positive disease.  She is currently on Veozah  for symptomatic support.  I recommended anti-inflammatories and Tylenol .  We will proceed with IV fluids and Decadron  5 mg today.  Disposition:  Fluids and 5 mg dex today F/u as scheduled- la    Visit Diagnosis 1. Convalescence following chemotherapy    Patient expressed understanding and was in agreement with this plan. She also understands that She can call clinic at any time with any questions, concerns, or complaints.   Thank you for allowing me to participate in the care of this very pleasant patient.   Tinnie Dawn, DNP, AGNP-C, AOCNP [facility] at [facility] [phone]

## 2024-04-28 NOTE — Progress Notes (Signed)
 Seen at [facility] yesterday for Weakness and Generalized Body Aches. Having a lot of pain in joints and knees. Having nausea, has meds, they are helping.

## 2024-05-03 ENCOUNTER — Encounter: Payer: Self-pay | Admitting: Nurse Practitioner

## 2024-05-04 ENCOUNTER — Ambulatory Visit
Admission: RE | Admit: 2024-05-04 | Discharge: 2024-05-04 | Disposition: A | Discharge status: Home / Self Care | Source: Ambulatory Visit | Attending: Radiation Oncology | Admitting: Radiation Oncology

## 2024-05-04 DIAGNOSIS — Z510 Encounter for antineoplastic radiation therapy: Secondary | ICD-10-CM | POA: Insufficient documentation

## 2024-05-04 DIAGNOSIS — R109 Unspecified abdominal pain: Secondary | ICD-10-CM | POA: Insufficient documentation

## 2024-05-04 DIAGNOSIS — Z79899 Other long term (current) drug therapy: Secondary | ICD-10-CM | POA: Diagnosis not present

## 2024-05-04 DIAGNOSIS — N83209 Unspecified ovarian cyst, unspecified side: Secondary | ICD-10-CM | POA: Insufficient documentation

## 2024-05-04 DIAGNOSIS — Z7952 Long term (current) use of systemic steroids: Secondary | ICD-10-CM | POA: Diagnosis not present

## 2024-05-04 DIAGNOSIS — C541 Malignant neoplasm of endometrium: Secondary | ICD-10-CM | POA: Insufficient documentation

## 2024-05-04 DIAGNOSIS — R918 Other nonspecific abnormal finding of lung field: Secondary | ICD-10-CM | POA: Insufficient documentation

## 2024-05-04 DIAGNOSIS — Z90710 Acquired absence of both cervix and uterus: Secondary | ICD-10-CM | POA: Diagnosis not present

## 2024-05-04 DIAGNOSIS — Z90722 Acquired absence of ovaries, bilateral: Secondary | ICD-10-CM | POA: Insufficient documentation

## 2024-05-06 ENCOUNTER — Inpatient Hospital Stay: Attending: Obstetrics and Gynecology

## 2024-05-06 DIAGNOSIS — C541 Malignant neoplasm of endometrium: Secondary | ICD-10-CM | POA: Diagnosis present

## 2024-05-06 LAB — CMP (CANCER CENTER ONLY)
ALT: 125 U/L — ABNORMAL HIGH (ref 0–44)
AST: 54 U/L — ABNORMAL HIGH (ref 15–41)
Albumin: 3.9 g/dL (ref 3.5–5.0)
Alkaline Phosphatase: 112 U/L (ref 38–126)
Anion gap: 8 (ref 5–15)
BUN: 10 mg/dL (ref 6–20)
CO2: 24 mmol/L (ref 22–32)
Calcium: 8.9 mg/dL (ref 8.9–10.3)
Chloride: 104 mmol/L (ref 98–111)
Creatinine: 0.59 mg/dL (ref 0.44–1.00)
GFR, Estimated: >60 mL/min (ref >60)
Glucose, Bld: 102 mg/dL — ABNORMAL HIGH (ref 70–99)
Potassium: 3.6 mmol/L (ref 3.5–5.1)
Sodium: 136 mmol/L (ref 135–145)
Total Bilirubin: 0.6 mg/dL (ref 0.0–1.2)
Total Protein: 6.6 g/dL (ref 6.5–8.1)

## 2024-05-09 ENCOUNTER — Ambulatory Visit
Admission: RE | Admit: 2024-05-09 | Discharge: 2024-05-09 | Disposition: A | Discharge status: Home / Self Care | Source: Ambulatory Visit | Attending: Radiation Oncology | Admitting: Radiation Oncology

## 2024-05-09 ENCOUNTER — Other Ambulatory Visit: Payer: Self-pay

## 2024-05-09 DIAGNOSIS — Z510 Encounter for antineoplastic radiation therapy: Secondary | ICD-10-CM | POA: Diagnosis not present

## 2024-05-09 LAB — RAD ONC ARIA SESSION SUMMARY
Course Elapsed Days: 0
Plan Fractions Treated to Date: 1
Plan Prescribed Dose Per Fraction: 4.7 Gy
Plan Total Fractions Prescribed: 5
Plan Total Prescribed Dose: 23.5 Gy
Reference Point Dosage Given to Date: 4.7 Gy
Reference Point Session Dosage Given: 4.7 Gy
Session Number: 1

## 2024-05-10 ENCOUNTER — Encounter: Payer: Self-pay | Admitting: Oncology

## 2024-05-11 ENCOUNTER — Ambulatory Visit
Admission: RE | Admit: 2024-05-11 | Discharge: 2024-05-11 | Disposition: A | Discharge status: Home / Self Care | Source: Ambulatory Visit | Attending: Radiation Oncology | Admitting: Radiation Oncology

## 2024-05-11 ENCOUNTER — Other Ambulatory Visit: Payer: Self-pay

## 2024-05-11 ENCOUNTER — Other Ambulatory Visit: Payer: Self-pay | Admitting: *Deleted

## 2024-05-11 DIAGNOSIS — C541 Malignant neoplasm of endometrium: Secondary | ICD-10-CM

## 2024-05-11 DIAGNOSIS — R7989 Other specified abnormal findings of blood chemistry: Secondary | ICD-10-CM

## 2024-05-11 DIAGNOSIS — Z510 Encounter for antineoplastic radiation therapy: Secondary | ICD-10-CM | POA: Diagnosis not present

## 2024-05-11 LAB — RAD ONC ARIA SESSION SUMMARY
Course Elapsed Days: 2
Plan Fractions Treated to Date: 2
Plan Prescribed Dose Per Fraction: 4.7 Gy
Plan Total Fractions Prescribed: 5
Plan Total Prescribed Dose: 23.5 Gy
Reference Point Dosage Given to Date: 9.4 Gy
Reference Point Session Dosage Given: 4.7 Gy
Session Number: 2

## 2024-05-11 NOTE — Telephone Encounter (Signed)
 Please add cmp when she comes to see dr vita  next month. Lauren- please be on the lookout for her cmp

## 2024-05-11 NOTE — Progress Notes (Signed)
 Lab-metc added per v/o Dr. Melanee

## 2024-05-12 ENCOUNTER — Other Ambulatory Visit: Payer: Self-pay | Admitting: Nurse Practitioner

## 2024-05-12 ENCOUNTER — Encounter: Payer: Self-pay | Admitting: Oncology

## 2024-05-16 ENCOUNTER — Ambulatory Visit
Admission: RE | Admit: 2024-05-16 | Discharge: 2024-05-16 | Disposition: A | Discharge status: Home / Self Care | Source: Ambulatory Visit | Attending: Radiation Oncology | Admitting: Radiation Oncology

## 2024-05-16 ENCOUNTER — Other Ambulatory Visit: Payer: Self-pay

## 2024-05-16 DIAGNOSIS — Z510 Encounter for antineoplastic radiation therapy: Secondary | ICD-10-CM | POA: Diagnosis not present

## 2024-05-16 LAB — RAD ONC ARIA SESSION SUMMARY
Course Elapsed Days: 7
Plan Fractions Treated to Date: 3
Plan Prescribed Dose Per Fraction: 4.7 Gy
Plan Total Fractions Prescribed: 5
Plan Total Prescribed Dose: 23.5 Gy
Reference Point Dosage Given to Date: 14.1 Gy
Reference Point Session Dosage Given: 4.7 Gy
Session Number: 3

## 2024-05-18 ENCOUNTER — Other Ambulatory Visit: Payer: Self-pay

## 2024-05-18 ENCOUNTER — Ambulatory Visit
Admission: RE | Admit: 2024-05-18 | Discharge: 2024-05-18 | Disposition: A | Discharge status: Home / Self Care | Source: Ambulatory Visit | Attending: Radiation Oncology | Admitting: Radiation Oncology

## 2024-05-18 DIAGNOSIS — Z510 Encounter for antineoplastic radiation therapy: Secondary | ICD-10-CM | POA: Diagnosis not present

## 2024-05-18 LAB — RAD ONC ARIA SESSION SUMMARY
Course Elapsed Days: 9
Plan Fractions Treated to Date: 4
Plan Prescribed Dose Per Fraction: 4.7 Gy
Plan Total Fractions Prescribed: 5
Plan Total Prescribed Dose: 23.5 Gy
Reference Point Dosage Given to Date: 18.8 Gy
Reference Point Session Dosage Given: 4.7 Gy
Session Number: 4

## 2024-05-20 ENCOUNTER — Encounter: Payer: Self-pay | Admitting: Oncology

## 2024-05-23 ENCOUNTER — Ambulatory Visit
Admission: RE | Admit: 2024-05-23 | Discharge: 2024-05-23 | Disposition: A | Discharge status: Home / Self Care | Source: Ambulatory Visit | Attending: Radiation Oncology | Admitting: Radiation Oncology

## 2024-05-23 ENCOUNTER — Other Ambulatory Visit: Payer: Self-pay

## 2024-05-23 DIAGNOSIS — Z510 Encounter for antineoplastic radiation therapy: Secondary | ICD-10-CM | POA: Diagnosis not present

## 2024-05-23 LAB — RAD ONC ARIA SESSION SUMMARY
Course Elapsed Days: 14
Plan Fractions Treated to Date: 5
Plan Prescribed Dose Per Fraction: 4.7 Gy
Plan Total Fractions Prescribed: 5
Plan Total Prescribed Dose: 23.5 Gy
Reference Point Dosage Given to Date: 23.5 Gy
Reference Point Session Dosage Given: 4.7 Gy
Session Number: 5

## 2024-05-25 ENCOUNTER — Other Ambulatory Visit: Payer: Self-pay | Admitting: Nurse Practitioner

## 2024-05-25 NOTE — Radiation Completion Notes (Signed)
 Patient Name: Cathy Bray, Cathy Bray MRN: [id number] Date of Birth: 09/01/1985 Referring Physician: ANNAH SKENE, M.D. Date of Service: 2024-05-25 Radiation Oncologist: Marcey Penton, M.D. [facility]                             RADIATION ONCOLOGY END OF TREATMENT NOTE     Diagnosis: C54.1 Malignant neoplasm of endometrium Staging on 2024-03-04: Endometrial cancer (HCC) T=pT1a, N=pN0, M=cM0 Intent: Curative     HPI: Patient is a 38 year old female now having completed chemotherapy for FIGO grade 1 endometrial carcinoma with secretory features.  I originally consulted back in September..  She has now completed 3 cycles of CarboTaxol.  She tolerated her treatments well although developed significant pain syndrome with Neulasta which was held for her third cycle.  Recommendation early on was for vaginal brachytherapy after completion of chemotherapy she is seen today she is having no increased lower Neri tract symptoms no diarrhea no vaginal discharge.      ==========DELIVERED PLANS==========  First Treatment Date: 2024-05-09 Last Treatment Date: 2024-05-23   Plan Name: Vagina Site: Vagina Technique: HDR [id number] Mode: Brachytherapy Dose Per Fraction: 4.7 Gy Prescribed Dose (Delivered / Prescribed): 23.5 Gy / 23.5 Gy Prescribed Fxs (Delivered / Prescribed): 5 / 5     ==========ON TREATMENT VISIT DATES========== 2024-05-09, 2024-05-11, 2024-05-16, 2024-05-16, 2024-05-18, 2024-05-23     ==========UPCOMING VISITS========== 07/22/2024 [facility] ON[facility] EST PT Skene Annah BROCKS, MD  07/22/2024 [facility] LAB [facility]  07/06/2024 [facility] RAD ONCOLOGY FOLLOW UP 30 Penton Marcey, MD  06/08/2024 [facility]  06/08/2024 [facility] EST PT [facility]  05/31/2024 [facility] [place] CT CT CHEST ABD/PELVIS W CONTRAST [facility] [place] CT 1        ==========APPENDIX - ON TREATMENT VISIT NOTES==========   See weekly On  Treatment Notes in Epic for details in the Media tab (listed as Progress notes on the On Treatment Visit Dates listed above).

## 2024-05-31 ENCOUNTER — Ambulatory Visit
Admission: RE | Admit: 2024-05-31 | Discharge: 2024-05-31 | Disposition: A | Source: Ambulatory Visit | Attending: Nurse Practitioner

## 2024-05-31 ENCOUNTER — Encounter: Payer: Self-pay | Admitting: Oncology

## 2024-05-31 DIAGNOSIS — C541 Malignant neoplasm of endometrium: Secondary | ICD-10-CM

## 2024-05-31 MED ORDER — IOPAMIDOL (ISOVUE-300) INJECTION 61%
100.0000 mL | Freq: Once | INTRAVENOUS | Status: AC | PRN
  Administered 2024-05-31: 100 mL via INTRAVENOUS

## 2024-06-06 ENCOUNTER — Encounter: Payer: Self-pay | Admitting: Oncology

## 2024-06-08 ENCOUNTER — Other Ambulatory Visit: Payer: Self-pay

## 2024-06-08 ENCOUNTER — Inpatient Hospital Stay (HOSPITAL_BASED_OUTPATIENT_CLINIC_OR_DEPARTMENT_OTHER): Admitting: Nurse Practitioner

## 2024-06-08 ENCOUNTER — Inpatient Hospital Stay

## 2024-06-08 ENCOUNTER — Ambulatory Visit: Payer: Self-pay | Admitting: Obstetrics and Gynecology

## 2024-06-08 ENCOUNTER — Ambulatory Visit: Attending: Obstetrics and Gynecology | Admitting: Obstetrics and Gynecology

## 2024-06-08 VITALS — BP 111/77 | HR 88 | Temp 97.4°F | Ht 63.0 in | Wt 163.0 lb

## 2024-06-08 DIAGNOSIS — R918 Other nonspecific abnormal finding of lung field: Secondary | ICD-10-CM | POA: Insufficient documentation

## 2024-06-08 DIAGNOSIS — R7989 Other specified abnormal findings of blood chemistry: Secondary | ICD-10-CM | POA: Insufficient documentation

## 2024-06-08 DIAGNOSIS — E8941 Symptomatic postprocedural ovarian failure: Secondary | ICD-10-CM | POA: Diagnosis not present

## 2024-06-08 DIAGNOSIS — C541 Malignant neoplasm of endometrium: Secondary | ICD-10-CM

## 2024-06-08 DIAGNOSIS — Z90710 Acquired absence of both cervix and uterus: Secondary | ICD-10-CM | POA: Insufficient documentation

## 2024-06-08 DIAGNOSIS — Z7189 Other specified counseling: Secondary | ICD-10-CM

## 2024-06-08 DIAGNOSIS — Z9079 Acquired absence of other genital organ(s): Secondary | ICD-10-CM | POA: Insufficient documentation

## 2024-06-08 DIAGNOSIS — Z9221 Personal history of antineoplastic chemotherapy: Secondary | ICD-10-CM | POA: Diagnosis not present

## 2024-06-08 DIAGNOSIS — Z90722 Acquired absence of ovaries, bilateral: Secondary | ICD-10-CM | POA: Diagnosis not present

## 2024-06-08 DIAGNOSIS — E28319 Asymptomatic premature menopause: Secondary | ICD-10-CM

## 2024-06-08 DIAGNOSIS — R309 Painful micturition, unspecified: Secondary | ICD-10-CM | POA: Diagnosis not present

## 2024-06-08 DIAGNOSIS — Z923 Personal history of irradiation: Secondary | ICD-10-CM | POA: Insufficient documentation

## 2024-06-08 LAB — CMP (CANCER CENTER ONLY)
ALT: 147 U/L — ABNORMAL HIGH (ref 0–44)
AST: 59 U/L — ABNORMAL HIGH (ref 15–41)
Albumin: 4.2 g/dL (ref 3.5–5.0)
Alkaline Phosphatase: 108 U/L (ref 38–126)
Anion gap: 12 (ref 5–15)
BUN: 11 mg/dL (ref 6–20)
CO2: 24 mmol/L (ref 22–32)
Calcium: 9.2 mg/dL (ref 8.9–10.3)
Chloride: 106 mmol/L (ref 98–111)
Creatinine: 0.67 mg/dL (ref 0.44–1.00)
GFR, Estimated: >60 mL/min (ref >60)
Glucose, Bld: 98 mg/dL (ref 70–99)
Potassium: 3.6 mmol/L (ref 3.5–5.1)
Sodium: 142 mmol/L (ref 135–145)
Total Bilirubin: 0.5 mg/dL (ref 0.0–1.2)
Total Protein: 6.4 g/dL — ABNORMAL LOW (ref 6.5–8.1)

## 2024-06-08 LAB — URINALYSIS, COMPLETE (UACMP) WITH MICROSCOPIC
Bacteria, UA: NONE SEEN
Bilirubin Urine: NEGATIVE
Glucose, UA: NEGATIVE mg/dL
Ketones, ur: NEGATIVE mg/dL
Leukocytes,Ua: NEGATIVE
Nitrite: NEGATIVE
Protein, ur: NEGATIVE mg/dL
Specific Gravity, Urine: 1.027 (ref 1.005–1.030)
pH: 5 (ref 5.0–8.0)

## 2024-06-08 MED ORDER — VENLAFAXINE HCL ER 37.5 MG PO CP24: ORAL_CAPSULE | ORAL | 0 refills | Status: AC

## 2024-06-08 NOTE — Progress Notes (Signed)
 Symptom Management Clinic  [facility] at [facility] A Department of the [place]. [facility] [address] [place], [place] [address] [phone] (phone) [phone] (fax)  Patient Care Team: [place], [place] [facility] as PCP - General Maurie Rayfield BIRCH, RN as Oncology Nurse Navigator Lenn Aran, MD as Consulting Physician (Radiation Oncology) Melanee Annah BROCKS, MD as Medical Oncologist (Oncology)   Name of the patient: Cathy Bray  [id number]  [birth year]   Date of visit: 06/08/24  Diagnosis- endometrial cancer  Chief complaint/ Reason for visit- hot flashes  Heme/Onc history:  Oncology History  Endometrial cancer (HCC)  01/20/2024 Initial Diagnosis   Endometrial cancer (HCC)   03/04/2024 Cancer Staging   Staging form: Corpus Uteri - Carcinoma and Carcinosarcoma, AJCC 8th Edition and FIGO 2023 - Pathologic stage from 03/04/2024: FIGO Stage IC, calculated as Stage IA (pT1a, pN0(sn), cM0, MMRd-, p53-) - Signed by Melanee Annah BROCKS, MD on 03/04/2024 Method of lymph node assessment: Sentinel lymph node biopsy   03/08/2024 Genetic Testing   Negative genetic testing. No pathogenic variants identified on the [employer] CancerNext+RNA Panel. The report date is 03/08/2024.  The [employer] CancerNext+RNAinsight Panel includes sequencing, rearrangement analysis, and RNA analysis for the following 40 genes: APC, ATM, BAP1, BARD1, BMPR1A, BRCA1, BRCA2, BRIP1, CDH1, CDKN2A, CHEK2, FH, FLCN, MET, MLH1, MSH2, MSH6, MUTYH, NF1, NTHL1, PALB2, PMS2, PTEN, RAD51C, RAD51D, RPS20, SMAD4, STK11, TP53, TSC1, TSC2, and VHL (sequencing and deletion/duplication); AXIN2, HOXB13, MBD4, MSH3, POLD1 and POLE (sequencing only); EPCAM and GREM1 (deletion/duplication only).   03/11/2024 -  Chemotherapy   Patient is on Treatment Plan : UTERINE Carboplatin  AUC 6 + Paclitaxel  q21d       Interval history- Cathy Bray is a 38 y.o. female who is being seen by gyn onc today for follow up of  her endometrial cancer. She reported intolerable hot flashes interferring with her day to day activities as well as her sleep and requests reevaluation. She is taking veozah  daily and gabapentin  at night. Reports hot flashes at least hourly. Endorses mood swings, poor sleep.   Review of systems- Review of Systems  Constitutional:  Positive for diaphoresis and malaise/fatigue. Negative for fever.  Cardiovascular:  Negative for chest pain and palpitations.  Neurological:  Negative for tingling and weakness.  Psychiatric/Behavioral:  Negative for depression and suicidal ideas. The patient has insomnia. The patient is not nervous/anxious.        Mood swings     Allergies  Allergen Reactions   Prochlorperazine      It caused me to feel uneasy... jittery.  I felt like even when trying to sit still my body would still move.   Shrimp Extract    Shrimp [Shellfish Allergy] Hives    Past Medical History:  Diagnosis Date   Dysplasia of cervix    Hematuria    History of LEEP (loop electrosurgical excision procedure) of cervix complicating pregnancy    Infertility, female    Missed ab    Nausea and vomiting during pregnancy    Ovarian cyst    LEFT- 2.4CM    S/P urgent classical cesarean section 01/08/2015   Done for malpresentation (fetal arm in vagina) at [redacted]w[redacted]d s/p PPROM     Second trimester bleeding     Past Surgical History:  Procedure Laterality Date   CESAREAN SECTION N/A 01/08/2015   Procedure: CESAREAN SECTION;  Surgeon: Gloris DELENA Hugger, MD;  Location: [facility] ORS;  Service: Obstetrics;  Laterality: N/A;  classical on uterus  LAPAROTOMY N/A 01/23/2015   Procedure: EXPLORATORY LAPAROTOMY;  Surgeon: Gladis DELENA Dollar, MD;  Location: [facility] ORS;  Service: Gynecology;  Laterality: N/A;   LEEP  2012   [facility]    Social History   Socioeconomic History   Marital status: Married    Spouse name: Not on file   Number of children: 3   Years of education: Not on file   Highest education level:  Not on file  Occupational History   Not on file  Tobacco Use   Smoking status: Never   Smokeless tobacco: Never  Vaping Use   Vaping status: Never Used  Substance and Sexual Activity   Alcohol use: No   Drug use: No   Sexual activity: Not Currently    Birth control/protection: None  Other Topics Concern   Not on file  Social History Narrative   Not on file   Social Drivers of Health   Financial Resource Strain: Low Risk  (02/08/2024)   Received from [facility]   Overall Financial Resource Strain (CARDIA)    Difficulty of Paying Living Expenses: Not hard at all  Food Insecurity: No Food Insecurity (03/04/2024)   Hunger Vital Sign    Worried About Running Out of Food in the Last Year: Never true    Ran Out of Food in the Last Year: Never true  Transportation Needs: No Transportation Needs (03/04/2024)   PRAPARE - Administrator, Civil Service (Medical): No    Lack of Transportation (Non-Medical): No  Physical Activity: Sufficiently Active (11/12/2017)   Exercise Vital Sign    Days of Exercise per Week: 5 days    Minutes of Exercise per Session: 30 min  Stress: Not on file  Social Connections: Not on file  Intimate Partner Violence: Not At Risk (03/04/2024)   Humiliation, Afraid, Rape, and Kick questionnaire    Fear of Current or Ex-Partner: No    Emotionally Abused: No    Physically Abused: No    Sexually Abused: No    Family History  Problem Relation Age of Onset   Hypertension Mother    Diabetes Father    Heart disease Neg Hx    Breast cancer Neg Hx    Colon cancer Neg Hx    Ovarian cancer Neg Hx      Current Outpatient Medications:    venlafaxine XR (EFFEXOR-XR) 37.5 MG 24 hr capsule, Take 1 capsule (37.5 mg total) by mouth daily with breakfast for 14 days, THEN 2 capsules (75 mg total) daily with breakfast for 16 days., Disp: 46 capsule, Rfl: 0   acetaminophen  (TYLENOL ) 325 MG tablet, Take 650 mg by mouth., Disp: , Rfl:     dexamethasone  (DECADRON ) 4 MG tablet, Take 2 tablets (8 mg total) by mouth daily for 3 days. Start the day after chemotherapy. Take with food. (Patient not taking: Reported on 06/08/2024), Disp: 30 tablet, Rfl: 1   fluticasone  (FLONASE ) 50 MCG/ACT nasal spray, 2 spray in each nostril Nasally Once a day, Disp: , Rfl:    gabapentin  (NEURONTIN ) 300 MG capsule, Take 1 capsule (300 mg total) by mouth at bedtime as needed (for hot flashes)., Disp: 90 capsule, Rfl: 1   ibuprofen  (ADVIL ) 600 MG tablet, Take 600 mg by mouth every 6 (six) hours as needed., Disp: , Rfl:    lactulose  (CHRONULAC ) 10 GM/15ML solution, Take 15 mLs (10 g total) by mouth 2 (two) times daily as needed for mild constipation., Disp: 236 mL, Rfl: 0  lidocaine -prilocaine  (EMLA ) cream, Apply to affected area once, Disp: 30 g, Rfl: 3   loratadine  (CLARITIN ) 5 MG chewable tablet, Chew 5 mg by mouth daily., Disp: , Rfl:    olopatadine (PATANOL) 0.1 % ophthalmic solution, Place 1 drop into both eyes. (Patient not taking: Reported on 06/08/2024), Disp: , Rfl:    ondansetron  (ZOFRAN ) 4 MG tablet, Take 4 mg by mouth. (Patient not taking: Reported on 06/08/2024), Disp: , Rfl:    ondansetron  (ZOFRAN ) 8 MG tablet, Take 1 tablet (8 mg total) by mouth every 8 (eight) hours as needed for nausea or vomiting. Start on the third day after chemotherapy., Disp: 30 tablet, Rfl: 1   promethazine  (PHENERGAN ) 25 MG tablet, Take 0.5 tablets (12.5 mg total) by mouth every 8 (eight) hours as needed for nausea or vomiting., Disp: 30 tablet, Rfl: 0   senna-docusate (SENOKOT-S) 8.6-50 MG tablet, Take 2 tablets by mouth. (Patient not taking: Reported on 06/08/2024), Disp: , Rfl:    VEOZAH  45 MG TABS, TAKE 1 TABLET BY MOUTH EVERY DAY, Disp: 30 tablet, Rfl: 2  Physical exam: There were no vitals filed for this visit. Physical Exam Constitutional:      Appearance: She is diaphoretic.  Skin:    Comments: Flushed; reports hot flashes.   Neurological:     Mental  Status: She is alert and oriented to person, place, and time.  Psychiatric:        Mood and Affect: Mood normal.        Behavior: Behavior normal.     Assessment and plan- Patient is a 38 y.o. female with history of endometrial cancer, seen by gyn onc today for follow up and asked to be evaluated in symptom management clinic for complaints of symptomatic premature menopause.   Add venlafaxine 37.5 mg daily ER. Plan to increase to 75 mg daily in 2 weeks if tolerating. If improved symptom control will plan to send 75 mg capsules to reduce pill burden at next fill.  She can increase gabapentin  300 mg to TID as needed. No increased benefit beyond the 300 mg dose has been reported though so may need to consider rotating to clonidine if intolerable side effects or lack of benefit.  She will continue veozah  at current dose.  Again reviewed that HRT is contraindicated given her malignancy.   I will plan to see her back in 6-8 weeks for reevaluation of her hot flashes. I can see her sooner if symptoms are not well controlled or intolerable side effects. She will continue to follow up with med-onc and gyn onc for management of her endometrial cancer.    Visit Diagnosis 1. Surgical menopause, symptomatic   2. Premature menopause     Patient expressed understanding and was in agreement with this plan. She also understands that She can call clinic at any time with any questions, concerns, or complaints.   Thank you for allowing me to participate in the care of this very pleasant patient.   Tinnie Dawn, DNP, AGNP-C, AOCNP [facility] [phone]  CC: Dr Melanee, Dr Elby
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
## 2024-06-09 ENCOUNTER — Telehealth: Payer: Self-pay

## 2024-06-09 ENCOUNTER — Ambulatory Visit: Payer: Self-pay | Admitting: Oncology

## 2024-06-09 DIAGNOSIS — C541 Malignant neoplasm of endometrium: Secondary | ICD-10-CM

## 2024-06-09 LAB — URINE CULTURE: Culture: NO GROWTH

## 2024-06-09 NOTE — Telephone Encounter (Signed)
 Per Dr. Melanee Please let her know that her lfts have been persistently elevated since oct 2025. CT abdomen showed nothing wrong with the liver other than fatty liver. I would like her to see [facility] asap. Can you make the referral? Thanks, Cathy Bray

## 2024-06-09 NOTE — Telephone Encounter (Signed)
 Patient is being referred to [facility] for mildly elevated LFTs.  [facility] will be contacting her shortly next week or week after for possible Endoscopy.  Outbound call to patient, informed of above; patient verbalized understanding.

## 2024-06-10 ENCOUNTER — Other Ambulatory Visit: Payer: Self-pay

## 2024-06-28 NOTE — Telephone Encounter (Signed)
 Spoke with patient and informed her of Dr. Williemae message:   ----- Message from RUEL KUNG, MD sent at 06/27/2024  1:11 PM EST ----- Labs are not grossly abnormal.  Very likely she has fatty liver disease but I would like to discuss with her the options needs a follow-up visit nonurgent ----- Message ----- From: Lab, Background User Sent: 06/14/2024   2:16 PM EST To: Ruel Kung, MD        Follow up appointment has been scheduled for 07/26/24 @ 1:15 pm. Patient voiced understanding. HH

## 2024-06-30 ENCOUNTER — Other Ambulatory Visit: Payer: Self-pay | Admitting: Nurse Practitioner

## 2024-07-02 ENCOUNTER — Other Ambulatory Visit: Payer: Self-pay | Admitting: Oncology

## 2024-07-02 DIAGNOSIS — C541 Malignant neoplasm of endometrium: Secondary | ICD-10-CM

## 2024-07-04 ENCOUNTER — Encounter: Payer: Self-pay | Admitting: Oncology

## 2024-07-06 ENCOUNTER — Ambulatory Visit: Admission: RE | Admit: 2024-07-06 | Source: Ambulatory Visit | Admitting: Radiation Oncology

## 2024-07-06 ENCOUNTER — Inpatient Hospital Stay: Attending: Obstetrics and Gynecology | Admitting: Obstetrics and Gynecology

## 2024-07-06 ENCOUNTER — Ambulatory Visit: Payer: Self-pay | Admitting: Obstetrics and Gynecology

## 2024-07-06 VITALS — BP 124/90 | HR 73 | Temp 98.3°F | Ht 63.0 in | Wt 162.0 lb

## 2024-07-06 DIAGNOSIS — Z008 Encounter for other general examination: Secondary | ICD-10-CM | POA: Insufficient documentation

## 2024-07-06 DIAGNOSIS — R350 Frequency of micturition: Secondary | ICD-10-CM | POA: Diagnosis not present

## 2024-07-06 DIAGNOSIS — C541 Malignant neoplasm of endometrium: Secondary | ICD-10-CM | POA: Diagnosis not present

## 2024-07-06 DIAGNOSIS — Z90710 Acquired absence of both cervix and uterus: Secondary | ICD-10-CM | POA: Insufficient documentation

## 2024-07-06 DIAGNOSIS — Z923 Personal history of irradiation: Secondary | ICD-10-CM | POA: Insufficient documentation

## 2024-07-06 DIAGNOSIS — E8941 Symptomatic postprocedural ovarian failure: Secondary | ICD-10-CM | POA: Insufficient documentation

## 2024-07-06 DIAGNOSIS — Z9221 Personal history of antineoplastic chemotherapy: Secondary | ICD-10-CM | POA: Insufficient documentation

## 2024-07-06 DIAGNOSIS — N3941 Urge incontinence: Secondary | ICD-10-CM | POA: Insufficient documentation

## 2024-07-06 DIAGNOSIS — Z9079 Acquired absence of other genital organ(s): Secondary | ICD-10-CM | POA: Insufficient documentation

## 2024-07-06 DIAGNOSIS — K760 Fatty (change of) liver, not elsewhere classified: Secondary | ICD-10-CM | POA: Insufficient documentation

## 2024-07-06 DIAGNOSIS — Z90722 Acquired absence of ovaries, bilateral: Secondary | ICD-10-CM | POA: Insufficient documentation

## 2024-07-06 DIAGNOSIS — R7989 Other specified abnormal findings of blood chemistry: Secondary | ICD-10-CM | POA: Diagnosis not present

## 2024-07-06 LAB — URINALYSIS, COMPLETE (UACMP) WITH MICROSCOPIC
Bacteria, UA: NONE SEEN
Bilirubin Urine: NEGATIVE
Glucose, UA: NEGATIVE mg/dL
Ketones, ur: NEGATIVE mg/dL
Leukocytes,Ua: NEGATIVE
Nitrite: NEGATIVE
Protein, ur: NEGATIVE mg/dL
Specific Gravity, Urine: 1.016 (ref 1.005–1.030)
pH: 6 (ref 5.0–8.0)
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
## 2024-07-07 ENCOUNTER — Other Ambulatory Visit: Payer: Self-pay

## 2024-07-07 LAB — URINE CULTURE: Culture: NO GROWTH

## 2024-07-20 ENCOUNTER — Other Ambulatory Visit: Payer: Self-pay | Admitting: Nurse Practitioner

## 2024-07-20 MED ORDER — VENLAFAXINE HCL ER 75 MG PO CP24: 75.0000 mg | ORAL_CAPSULE | Freq: Every day | ORAL | 1 refills | Status: AC

## 2024-07-21 ENCOUNTER — Other Ambulatory Visit: Payer: Self-pay

## 2024-07-21 DIAGNOSIS — C541 Malignant neoplasm of endometrium: Secondary | ICD-10-CM

## 2024-07-22 ENCOUNTER — Encounter: Payer: Self-pay | Admitting: Oncology

## 2024-07-22 ENCOUNTER — Inpatient Hospital Stay

## 2024-07-22 ENCOUNTER — Inpatient Hospital Stay: Admitting: Oncology

## 2024-07-22 VITALS — BP 122/80 | HR 79 | Temp 98.6°F | Resp 17 | Wt 161.0 lb

## 2024-07-22 DIAGNOSIS — R7989 Other specified abnormal findings of blood chemistry: Secondary | ICD-10-CM | POA: Diagnosis not present

## 2024-07-22 DIAGNOSIS — Z08 Encounter for follow-up examination after completed treatment for malignant neoplasm: Secondary | ICD-10-CM

## 2024-07-22 DIAGNOSIS — D7589 Other specified diseases of blood and blood-forming organs: Secondary | ICD-10-CM | POA: Insufficient documentation

## 2024-07-22 DIAGNOSIS — Z8542 Personal history of malignant neoplasm of other parts of uterus: Secondary | ICD-10-CM

## 2024-07-22 DIAGNOSIS — C541 Malignant neoplasm of endometrium: Secondary | ICD-10-CM | POA: Diagnosis not present

## 2024-07-22 LAB — CMP (CANCER CENTER ONLY)
ALT: 112 U/L — ABNORMAL HIGH (ref 0–44)
AST: 56 U/L — ABNORMAL HIGH (ref 15–41)
Albumin: 4.4 g/dL (ref 3.5–5.0)
Alkaline Phosphatase: 105 U/L (ref 38–126)
Anion gap: 9 (ref 5–15)
BUN: 9 mg/dL (ref 6–20)
CO2: 28 mmol/L (ref 22–32)
Calcium: 9.8 mg/dL (ref 8.9–10.3)
Chloride: 106 mmol/L (ref 98–111)
Creatinine: 0.7 mg/dL (ref 0.44–1.00)
GFR, Estimated: >60 mL/min
Glucose, Bld: 97 mg/dL (ref 70–99)
Potassium: 4.1 mmol/L (ref 3.5–5.1)
Sodium: 143 mmol/L (ref 135–145)
Total Bilirubin: 0.5 mg/dL (ref 0.0–1.2)
Total Protein: 6.8 g/dL (ref 6.5–8.1)

## 2024-07-22 LAB — CBC WITH DIFFERENTIAL (CANCER CENTER ONLY)
Abs Immature Granulocytes: 0.01 K/uL (ref 0.00–0.07)
Basophils Absolute: 0 K/uL (ref 0.0–0.1)
Basophils Relative: 1 %
Eosinophils Absolute: 0.3 K/uL (ref 0.0–0.5)
Eosinophils Relative: 8 %
HCT: 37.9 % (ref 36.0–46.0)
Hemoglobin: 13.1 g/dL (ref 12.0–15.0)
Immature Granulocytes: 0 %
Lymphocytes Relative: 32 %
Lymphs Abs: 1.3 K/uL (ref 0.7–4.0)
MCH: 33.8 pg (ref 26.0–34.0)
MCHC: 34.6 g/dL (ref 30.0–36.0)
MCV: 97.7 fL (ref 80.0–100.0)
Monocytes Absolute: 0.3 K/uL (ref 0.1–1.0)
Monocytes Relative: 8 %
Neutro Abs: 2 K/uL (ref 1.7–7.7)
Neutrophils Relative %: 51 %
Platelet Count: 203 K/uL (ref 150–400)
RBC: 3.88 MIL/uL (ref 3.87–5.11)
RDW: 12.3 % (ref 11.5–15.5)
WBC Count: 4 K/uL (ref 4.0–10.5)
nRBC: 0 % (ref 0.0–0.2)

## 2024-07-22 NOTE — Progress Notes (Signed)
 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 Leonce and subsequently seen by GYN oncology for concerns of endometrial cancer.  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  IHC showed tumor cells positive for PAX8 and ER while negative for Napsin A p53 p16 Calretinin TTF-1 and WT1.  Also noted to have high-grade SIL on Pap.     She underwent TAH/BSO sentinel lymph node biopsy omentectomy on 02/02/2024.  She was noted to have multiple loops of ileum and ileal mesentery adherent to the anterior abdominal wall uterine fundus and pelvic sidewall.  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  4.5 cm.  Myometrial invasion absent.  Lymphatic invasion absent.  Cervix and serosa negative for malignancy.  Left and right ovary and fallopian tube negative for malignancy.  2 pelvic lymph nodes on the left and 5 on the right negative for malignancy.  Omentum resection showed benign fibroadipose tissue.   Patient seen by GYN oncology.  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  She is currently under surveillance following chemotherapy for endometrial cancer. She reports ongoing hair regrowth and increased facial hair growth. She continues to follow up with her gynecologist. She uses vaginal dilators and lubricants for pelvic symptoms, which have been beneficial. She denies new or worsening gynecologic symptoms.  She has persistently elevated liver enzymes, with AST of 56 and ALT of 112, stable compared to prior measurements. She underwent liver ultrasound and additional laboratory evaluation with hepatology, and has a follow-up appointment scheduled. She denies symptoms of hepatic dysfunction.  She experiences constant myalgias and occasional nausea, but denies other new or worsening symptoms. She has increased her physical activity, walking more frequently and occasionally using stairs, but is not engaged in a structured exercise regimen.       ECOG PS- 0 Pain scale- 0   Review of systems- Review of Systems  Constitutional:  Positive for malaise/fatigue. Negative for chills, fever and weight loss.  HENT:  Negative for congestion, ear discharge and nosebleeds.   Eyes:  Negative for blurred vision.  Respiratory:  Negative for cough, hemoptysis, sputum production, shortness of breath and wheezing.   Cardiovascular:  Negative for chest pain, palpitations, orthopnea and claudication.  Gastrointestinal:  Negative for abdominal pain, blood in stool, constipation, diarrhea, heartburn, melena, nausea and vomiting.  Genitourinary:  Negative for dysuria, flank pain, frequency, hematuria and urgency.  Musculoskeletal:  Positive for joint pain. Negative for back pain and myalgias.  Skin:  Negative for rash.  Neurological:  Negative for dizziness, tingling, focal weakness, seizures, weakness and  headaches.  Endo/Heme/Allergies:  Does not bruise/bleed easily.   Psychiatric/Behavioral:  Negative for depression and suicidal ideas. The patient does not have insomnia.       Allergies[1]   Past Medical History:  Diagnosis Date   Dysplasia of cervix    Hematuria    History of LEEP (loop electrosurgical excision procedure) of cervix complicating pregnancy    Infertility, female    Missed ab    Nausea and vomiting during pregnancy    Ovarian cyst    LEFT- 2.4CM    S/P urgent classical cesarean section 01/08/2015   Done for malpresentation (fetal arm in vagina) at [redacted]w[redacted]d s/p PPROM     Second trimester bleeding      Past Surgical History:  Procedure Laterality Date   CESAREAN SECTION N/A 01/08/2015   Procedure: CESAREAN SECTION;  Surgeon: Gloris DELENA Hugger, MD;  Location: [facility];  Service: Obstetrics;  Laterality: N/A;  classical on uterus    LAPAROTOMY N/A 01/23/2015   Procedure: EXPLORATORY LAPAROTOMY;  Surgeon: Gladis DELENA Dollar, MD;  Location: [facility] ORS;  Service: Gynecology;  Laterality: N/A;   LEEP  2012   [facility]    Social History   Socioeconomic History   Marital status: Married    Spouse name: Not on file   Number of children: 3   Years of education: Not on file   Highest education level: Not on file  Occupational History   Not on file  Tobacco Use   Smoking status: Never   Smokeless tobacco: Never  Vaping Use   Vaping status: Never Used  Substance and Sexual Activity   Alcohol use: No   Drug use: No   Sexual activity: Not Currently    Birth control/protection: None  Other Topics Concern   Not on file  Social History Narrative   Not on file   Social Drivers of Health   Tobacco Use: Low Risk (07/22/2024)   Patient History    Smoking Tobacco Use: Never    Smokeless Tobacco Use: Never    Passive Exposure: Not on file  Financial Resource Strain: Low Risk  (02/08/2024)   Received from [facility]   Overall Financial Resource Strain (CARDIA)    Difficulty of Paying Living Expenses: Not hard at all   Food Insecurity: No Food Insecurity (06/14/2024)   Received from [facility]   Epic    Within the past 12 months, you worried that your food would run out before you got the money to buy more.: Never true    Within the past 12 months, the food you bought just didn't last and you didn't have money to get more.: Never true  Transportation Needs: No Transportation Needs (06/14/2024)   Received from [facility] - Transportation    In the past 12 months, has lack of transportation kept you from medical appointments or from getting medications?: No    Lack of Transportation (Non-Medical): No  Physical Activity: Not on file  Stress: Not on file  Social Connections: Not on file  Intimate Partner Violence: Not At Risk (03/04/2024)   Epic    Fear of Current or Ex-Partner: No    Emotionally Abused: No    Physically Abused: No    Sexually Abused: No  Depression (PHQ2-9): Low Risk (07/06/2024)   Depression (PHQ2-9)    PHQ-2 Score: 0  Alcohol Screen: Not on file  Housing: Low Risk  (06/14/2024)   [facility] from [facility]   Epic    In the last 12 months, was there a time when you were not able to pay the mortgage or rent on time?: No    In the past 12 months, how many times have you moved where you were living?: 0    At any time in the past 12 months, were you homeless or living in a shelter (including now)?: No  Utilities: Not At Risk (06/14/2024)   Received from [facility]   Epic    In the past 12 months has the electric, gas, oil, or water company threatened to shut off services in your home?: No  Health Literacy: Not on file    Family History  Problem Relation Age of Onset   Hypertension Mother    Diabetes Father    Heart disease Neg Hx    Breast cancer Neg Hx    Colon cancer Neg Hx    Ovarian cancer Neg Hx     Current Medications[2]  Physical exam:  Vitals:   07/22/24 0943  BP: 122/80  Pulse:  79  Resp: 17  Temp: 98.6 F (37 C)  SpO2: 97%  Weight: 161 lb (73 kg)   Physical Exam Cardiovascular:     Rate and Rhythm: Normal rate and regular rhythm.     Heart sounds: Normal heart sounds.  Pulmonary:     Effort: Pulmonary effort is normal.     Breath sounds: Normal breath sounds.  Abdominal:     General: Bowel sounds are normal.     Palpations: Abdomen is soft.  Skin:    General: Skin is warm and dry.  Neurological:     Mental Status: She is alert and oriented to person, place, and time.      I have personally reviewed labs listed below:    Latest Ref Rng & Units 07/22/2024    9:30 AM  CMP  Glucose 70 - 99 mg/dL 97   BUN 6 - 20 mg/dL 9   Creatinine 9.55 - 8.99 mg/dL 9.29   Sodium 864 - 854 mmol/L 143   Potassium 3.5 - 5.1 mmol/L 4.1   Chloride 98 - 111 mmol/L 106   CO2 22 - 32 mmol/L 28   Calcium  8.9 - 10.3 mg/dL 9.8   Total Protein 6.5 - 8.1 g/dL 6.8   Total Bilirubin 0.0 - 1.2 mg/dL 0.5   Alkaline Phos 38 - 126 U/L 105   AST 15 - 41 U/L 56   ALT 0 - 44 U/L 112       Latest Ref Rng & Units 07/22/2024    9:30 AM  CBC  WBC 4.0 - 10.5 K/uL 4.0   Hemoglobin 12.0 - 15.0 g/dL 86.8   Hematocrit 63.9 - 46.0 % 37.9   Platelets 150 - 400 K/uL 203      Assessment and plan- Patient is a 39 y.o. 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 She is here for routine follow-up of endometrial cancer s/p 3 cycles of adjuvant CarboTaxol chemotherapy and vaginal brachytherapy  Assessment and Plan    Endometrial cancer Normal pelvic exam recently done by GYN oncology.  Clinically patient is doing well with no concerning signs and symptoms of recurrence based on today's exam. - Alternate follow-up visits with gynecology and oncology; next oncology visit in July.  - Defer routine imaging; reassess based on symptoms and exam findings. - Encouraged continued use of vaginal dilators and  lubricants.  Abnormal liver enzymes Mildly  elevated AST and ALT stable with preserved hepatic function. Under GI  evaluation. No acute intervention needed. - Review recent blood work and ultrasound results with Dr. Therisa        Visit Diagnosis 1. Encounter for follow-up surveillance of endometrial cancer      Dr. Annah Skene, MD, MPH [facility] [phone] 07/22/2024 12:52 PM                   [1]  Allergies Allergen Reactions   Prochlorperazine      It caused me to feel uneasy... jittery.  I felt like even when trying to sit still my body would still move.   Shrimp Extract    Shrimp [Shellfish Allergy] Hives  [2]  Current Outpatient Medications:    acetaminophen  (TYLENOL ) 325 MG tablet, Take 650 mg by mouth., Disp: , Rfl:    fluticasone  (FLONASE ) 50 MCG/ACT nasal spray, 2 spray in each nostril Nasally Once a day, Disp: , Rfl:    gabapentin  (NEURONTIN ) 300 MG capsule, Take 1 capsule (300 mg total) by mouth at bedtime as needed (for hot flashes)., Disp: 90 capsule, Rfl: 1   ibuprofen  (ADVIL ) 600 MG tablet, Take 600 mg by mouth every 6 (six) hours as needed., Disp: , Rfl:    olopatadine (PATANOL) 0.1 % ophthalmic solution, Place 1 drop into both eyes., Disp: , Rfl:    ondansetron  (ZOFRAN ) 4 MG tablet, Take 4 mg by mouth every 8 (eight) hours as needed., Disp: , Rfl:    ondansetron  (ZOFRAN ) 8 MG tablet, TAKE 1 TABLET (8 MG TOTAL) BY MOUTH EVERY 8 (EIGHT) HOURS AS NEEDED FOR NAUSEA OR VOMITING. START ON THE THIRD DAY AFTER CHEMOTHERAPY., Disp: 30 tablet, Rfl: 1   promethazine  (PHENERGAN ) 25 MG tablet, Take 0.5 tablets (12.5 mg total) by mouth every 8 (eight) hours as needed for nausea or vomiting., Disp: 30 tablet, Rfl: 0   venlafaxine  XR (EFFEXOR -XR) 37.5 MG 24 hr capsule, Take 1 capsule (37.5 mg total) by mouth daily with breakfast for 14 days, THEN 2 capsules (75 mg total) daily with breakfast for 16 days., Disp: 46 capsule, Rfl: 0   venlafaxine  XR (EFFEXOR -XR) 75 MG 24 hr capsule, Take  1 capsule (75 mg total) by mouth daily., Disp: 90 capsule, Rfl: 1   VEOZAH  45 MG TABS, TAKE 1 TABLET BY MOUTH EVERY DAY, Disp: 30 tablet, Rfl: 2   dexamethasone  (DECADRON ) 4 MG tablet, Take 2 tablets (8 mg total) by mouth daily for 3 days. Start the day after chemotherapy. Take with food. (Patient not taking: Reported on 07/22/2024), Disp: 30 tablet, Rfl: 1   lactulose  (CHRONULAC ) 10 GM/15ML solution, Take 15 mLs (10 g total) by mouth 2 (two) times daily as needed for mild constipation. (Patient not taking: Reported on 07/22/2024), Disp: 236 mL, Rfl: 0   lidocaine -prilocaine  (EMLA ) cream, Apply to affected area once (Patient not taking: Reported on 07/22/2024), Disp: 30 g, Rfl: 3   loratadine  (CLARITIN ) 5 MG chewable tablet, Chew 5 mg by mouth daily. (Patient not taking: Reported on 07/22/2024), Disp: , Rfl:    senna-docusate (SENOKOT-S) 8.6-50 MG tablet, Take 2 tablets by mouth. (Patient not taking: Reported on 07/22/2024), Disp: , Rfl:   "

## 2024-07-23 ENCOUNTER — Other Ambulatory Visit: Payer: Self-pay

## 2024-07-26 ENCOUNTER — Telehealth: Payer: Self-pay | Admitting: Pharmacy Technician

## 2024-07-26 ENCOUNTER — Encounter: Payer: Self-pay | Admitting: Oncology

## 2024-07-26 ENCOUNTER — Telehealth: Payer: Self-pay

## 2024-07-26 ENCOUNTER — Other Ambulatory Visit (HOSPITAL_COMMUNITY): Payer: Self-pay

## 2024-07-26 NOTE — Telephone Encounter (Signed)
 Oral Oncology Patient Advocate Encounter  Prior Authorization for [employer]  has been approved.    PA# [id number] Effective dates: 07/26/2024 through 07/26/2025  Barbette (Patty) Chet Burnet, CPhT  [facility], [place], [place] Hematology/Oncology - Oral Chemotherapy Patient Advocate Specialist III Phone: [phone]  Fax: [phone]

## 2024-07-26 NOTE — Telephone Encounter (Signed)
 Forwarded to [employer] to initiate PA

## 2024-07-26 NOTE — Telephone Encounter (Signed)
-----   Message from Grayce FALCON sent at 07/22/2024 10:01 AM EST ----- Regarding: PRIOR AUTH STARTED- COVER MY MEDS PRIOR AUTH STARTED- COVER MY MEDS sent to her chart.

## 2024-07-26 NOTE — Telephone Encounter (Signed)
 Oral Oncology Patient Advocate Encounter   New authorization  Patient has two insurances  Member ID: [id number]   Received notification that prior authorization for Veozah  is required.   PA submitted on CMM via Latent Key [id number] Status is pending     Cathy Bray (Patty) Chet Burnet, CPhT  [facility], [place], [place] Hematology/Oncology - Oral Chemotherapy Patient Advocate Specialist III Phone: [phone]  Fax: [phone]

## 2024-07-28 ENCOUNTER — Inpatient Hospital Stay: Admitting: Nurse Practitioner

## 2024-07-28 DIAGNOSIS — E28310 Symptomatic premature menopause: Secondary | ICD-10-CM | POA: Diagnosis not present

## 2024-07-28 DIAGNOSIS — E8941 Symptomatic postprocedural ovarian failure: Secondary | ICD-10-CM

## 2024-07-28 NOTE — Progress Notes (Signed)
 "  Symptom Management Clinic  [facility] at [facility] Department of the [place] [address] [place], [place] [address] [phone] (phone) [phone] (fax)  Virtual Visit Progress Note  I connected with Cathy Bray on 07/28/24 at  3:30 PM EST by video enabled telemedicine visit and verified that I am speaking with the correct person using two identifiers.   I discussed the limitations, risks, security and privacy concerns of performing an evaluation and management service by telemedicine and the availability of in-person appointments. I also discussed with the patient that there may be a patient responsible charge related to this service. The patient expressed understanding and agreed to proceed.   Other persons participating in the visit and their role in the encounter: none   Patients location: home  Providers location: clinic   Patient Care Team: [place], [facility] as PCP - General Maurie Rayfield BIRCH, RN as Oncology Nurse Navigator Lenn Aran, MD as Consulting Physician (Radiation Oncology) Melanee Annah BROCKS, MD as Consulting Physician (Oncology)   Name of the patient: Cathy Bray  [id number]  May 10, 1986   Date of visit: 07/28/24  Diagnosis- Endometrial ACancer  Chief complaint/ Reason for visit- Hot flashes d/t surgical menopause  Heme/Onc history:  Oncology History  Endometrial cancer (HCC)  01/20/2024 Initial Diagnosis   Endometrial cancer (HCC)   03/04/2024 Cancer Staging   Staging form: Corpus Uteri - Carcinoma and Carcinosarcoma, AJCC 8th Edition and FIGO 2023 - Pathologic stage from 03/04/2024: FIGO Stage IC, calculated as Stage IA (pT1a, pN0(sn), cM0, MMRd-, p53-) - Signed by Melanee Annah BROCKS, MD on 03/04/2024 Method of lymph node assessment: Sentinel lymph node biopsy   03/08/2024 Genetic Testing   Negative genetic testing. No pathogenic variants identified on the [facility] CancerNext+RNA Panel. The  report date is 03/08/2024.  The [facility] CancerNext+RNAinsight Panel includes sequencing, rearrangement analysis, and RNA analysis for the following 40 genes: APC, ATM, BAP1, BARD1, BMPR1A, BRCA1, BRCA2, BRIP1, CDH1, CDKN2A, CHEK2, FH, FLCN, MET, MLH1, MSH2, MSH6, MUTYH, NF1, NTHL1, PALB2, PMS2, PTEN, RAD51C, RAD51D, RPS20, SMAD4, STK11, TP53, TSC1, TSC2, and VHL (sequencing and deletion/duplication); AXIN2, HOXB13, MBD4, MSH3, POLD1 and POLE (sequencing only); EPCAM and GREM1 (deletion/duplication only).   03/11/2024 -  Chemotherapy   Patient is on Treatment Plan : UTERINE Carboplatin  AUC 6 + Paclitaxel  q21d       Interval history- Cathy Bray is a 39 y.o. female who agrees to telemedicine visit today to follow up on hot flashes. She is taking venlafaxine  at increased dose and tolerating well. She takes gabapentin  at night for breakthrough symptoms. She takes veozah  as well. Her mood is up and down but overall more stable. She feels like her hot flashes are better controlled and she is sleeping better. Energy improving gradually.   Review of systems- Review of Systems  Constitutional:  Positive for malaise/fatigue. Negative for chills.  Respiratory:  Negative for shortness of breath.   Cardiovascular:  Negative for chest pain and palpitations.  Gastrointestinal:  Negative for abdominal pain.  Genitourinary:  Negative for dysuria.  Musculoskeletal:  Negative for joint pain and myalgias.  Psychiatric/Behavioral:  Negative for depression. The patient is not nervous/anxious and does not have insomnia.     Allergies[1]  Past Medical History:  Diagnosis Date   Dysplasia of cervix    Hematuria    History of LEEP (loop electrosurgical excision procedure) of cervix complicating pregnancy    Infertility, female    Missed ab  Nausea and vomiting during pregnancy    Ovarian cyst    LEFT- 2.4CM    S/P urgent classical cesarean section 01/08/2015   Done for malpresentation (fetal arm in vagina) at  [redacted]w[redacted]d s/p PPROM     Second trimester bleeding     Past Surgical History:  Procedure Laterality Date   CESAREAN SECTION N/A 01/08/2015   Procedure: CESAREAN SECTION;  Surgeon: Gloris DELENA Hugger, MD;  Location: [facility] ORS;  Service: Obstetrics;  Laterality: N/A;  classical on uterus    LAPAROTOMY N/A 01/23/2015   Procedure: EXPLORATORY LAPAROTOMY;  Surgeon: Gladis DELENA Dollar, MD;  Location: [facility] ORS;  Service: Gynecology;  Laterality: N/A;   LEEP  2012   [facility]    Social History   Socioeconomic History   Marital status: Married    Spouse name: Not on file   Number of children: 3   Years of education: Not on file   Highest education level: Not on file  Occupational History   Not on file  Tobacco Use   Smoking status: Never   Smokeless tobacco: Never  Vaping Use   Vaping status: Never Used  Substance and Sexual Activity   Alcohol use: No   Drug use: No   Sexual activity: Not Currently    Birth control/protection: None  Other Topics Concern   Not on file  Social History Narrative   Not on file   Social Drivers of Health   Tobacco Use: Low Risk (07/22/2024)   Patient History    Smoking Tobacco Use: Never    Smokeless Tobacco Use: Never    Passive Exposure: Not on file  Financial Resource Strain: Low Risk  (02/08/2024)   Received from [facility]   Overall Financial Resource Strain (CARDIA)    Difficulty of Paying Living Expenses: Not hard at all  Food Insecurity: No Food Insecurity (06/14/2024)   Received from [facility]   Epic    Within the past 12 months, you worried that your food would run out before you got the money to buy more.: Never true    Within the past 12 months, the food you bought just didn't last and you didn't have money to get more.: Never true  Transportation Needs: No Transportation Needs (06/14/2024)   [facility] - Transportation    In the past 12 months, has lack of  transportation kept you from medical appointments or from getting medications?: No    Lack of Transportation (Non-Medical): No  Physical Activity: Not on file  Stress: Not on file  Social Connections: Not on file  Intimate Partner Violence: Not At Risk (03/04/2024)   Epic    Fear of Current or Ex-Partner: No    Emotionally Abused: No    Physically Abused: No    Sexually Abused: No  Depression (PHQ2-9): Low Risk (07/28/2024)   Depression (PHQ2-9)    PHQ-2 Score: 0  Alcohol Screen: Not on file  Housing: Low Risk  (06/14/2024)   Received from [facility]   Epic    In the last 12 months, was there a time when you were not able to pay the mortgage or rent on time?: No    In the past 12 months, how many times have you moved where you were living?: 0    At any time in the past 12 months, were you homeless or living in a shelter (including now)?: No  Utilities: Not At  Risk (06/14/2024)   Received from [facility]   Epic    In the past 12 months has the electric, gas, oil, or water company threatened to shut off services in your home?: No  Health Literacy: Not on file    Family History  Problem Relation Age of Onset   Hypertension Mother    Diabetes Father    Heart disease Neg Hx    Breast cancer Neg Hx    Colon cancer Neg Hx    Ovarian cancer Neg Hx     Current Medications[2]  Physical exam: There were no vitals filed for this visit. Physical Exam Vitals reviewed.  Constitutional:      Appearance: She is not ill-appearing.  Pulmonary:     Effort: No respiratory distress.  Neurological:     Mental Status: She is alert and oriented to person, place, and time.  Psychiatric:        Mood and Affect: Mood normal.        Behavior: Behavior normal.       Assessment and plan- Patient is a 39 y.o. 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 On venlafaxine  75 mg daily and veozah . She takes gabapentin  300 mg at night for  breakthrough symptoms. She feels that symptoms are well controlled. Tolerating medications well. Continue as prescribed.   Disposition Follow up virtually in 6 months. Contact clinic if any complaints    Visit Diagnosis No diagnosis found.  Patient expressed understanding and was in agreement with this plan. She also understands that She can call clinic at any time with any questions, concerns, or complaints.   Thank you for allowing me to participate in the care of this very pleasant patient.   Tinnie Dawn, DNP, AGNP-C, AOCNP [facility] [phone]   I discussed the assessment and treatment plan with the patient. The patient was provided an opportunity to ask questions and all were answered. The patient agreed with the plan and demonstrated an understanding of the instructions.   The patient was advised to call back or seek an in-person evaluation if the symptoms worsen or if the condition fails to improve as anticipated.   I spent 10 minutes face-to-face video visit time dedicated to the care of this patient on the date of this encounter to include pre-visit review of previous note, face-to-face time with the patient, and post visit ordering of testing/documentation.        [1]  Allergies Allergen Reactions   Prochlorperazine      It caused me to feel uneasy... jittery.  I felt like even when trying to sit still my body would still move.   Shrimp Extract    Shrimp [Shellfish Allergy] Hives  [2]  Current Outpatient Medications:    acetaminophen  (TYLENOL ) 325 MG tablet, Take 650 mg by mouth., Disp: , Rfl:    fluticasone  (FLONASE ) 50 MCG/ACT nasal spray, 2 spray in each nostril Nasally Once a day, Disp: , Rfl:    gabapentin  (NEURONTIN ) 300 MG capsule, Take 1 capsule (300 mg total) by mouth at bedtime as needed (for hot flashes)., Disp: 90 capsule, Rfl: 1   ibuprofen  (ADVIL ) 600 MG tablet, Take 600 mg by mouth every 6 (six) hours as needed., Disp: , Rfl:     olopatadine (PATANOL) 0.1 % ophthalmic solution, Place 1 drop into both eyes., Disp: , Rfl:    ondansetron  (ZOFRAN ) 4 MG tablet, Take 4 mg by mouth every 8 (eight) hours as needed.,  Disp: , Rfl:    ondansetron  (ZOFRAN ) 8 MG tablet, TAKE 1 TABLET (8 MG TOTAL) BY MOUTH EVERY 8 (EIGHT) HOURS AS NEEDED FOR NAUSEA OR VOMITING. START ON THE THIRD DAY AFTER CHEMOTHERAPY., Disp: 30 tablet, Rfl: 1   promethazine  (PHENERGAN ) 25 MG tablet, Take 0.5 tablets (12.5 mg total) by mouth every 8 (eight) hours as needed for nausea or vomiting., Disp: 30 tablet, Rfl: 0   venlafaxine  XR (EFFEXOR -XR) 37.5 MG 24 hr capsule, Take 1 capsule (37.5 mg total) by mouth daily with breakfast for 14 days, THEN 2 capsules (75 mg total) daily with breakfast for 16 days., Disp: 46 capsule, Rfl: 0   venlafaxine  XR (EFFEXOR -XR) 75 MG 24 hr capsule, Take 1 capsule (75 mg total) by mouth daily., Disp: 90 capsule, Rfl: 1   VEOZAH  45 MG TABS, TAKE 1 TABLET BY MOUTH EVERY DAY, Disp: 30 tablet, Rfl: 2   dexamethasone  (DECADRON ) 4 MG tablet, Take 2 tablets (8 mg total) by mouth daily for 3 days. Start the day after chemotherapy. Take with food. (Patient not taking: Reported on 07/28/2024), Disp: 30 tablet, Rfl: 1   lactulose  (CHRONULAC ) 10 GM/15ML solution, Take 15 mLs (10 g total) by mouth 2 (two) times daily as needed for mild constipation. (Patient not taking: Reported on 07/28/2024), Disp: 236 mL, Rfl: 0   lidocaine -prilocaine  (EMLA ) cream, Apply to affected area once (Patient not taking: Reported on 07/28/2024), Disp: 30 g, Rfl: 3   loratadine  (CLARITIN ) 5 MG chewable tablet, Chew 5 mg by mouth daily. (Patient not taking: Reported on 07/28/2024), Disp: , Rfl:    senna-docusate (SENOKOT-S) 8.6-50 MG tablet, Take 2 tablets by mouth. (Patient not taking: Reported on 07/28/2024), Disp: , Rfl:   "

## 2024-10-05 ENCOUNTER — Inpatient Hospital Stay

## 2025-01-18 ENCOUNTER — Inpatient Hospital Stay

## 2025-01-18 ENCOUNTER — Inpatient Hospital Stay: Admitting: Oncology
